# Patient Record
Sex: Female | Born: 1935 | Race: White | Hispanic: No | Smoking: Former smoker
Health system: Southern US, Community
[De-identification: ages and names within clinical notes are randomized; demographics above are authoritative.]

## PROBLEM LIST (undated history)

## (undated) DIAGNOSIS — M1990 Unspecified osteoarthritis, unspecified site: Secondary | ICD-10-CM

## (undated) DIAGNOSIS — M8580 Other specified disorders of bone density and structure, unspecified site: Secondary | ICD-10-CM

## (undated) DIAGNOSIS — J449 Chronic obstructive pulmonary disease, unspecified: Secondary | ICD-10-CM

## (undated) DIAGNOSIS — E079 Disorder of thyroid, unspecified: Secondary | ICD-10-CM

## (undated) DIAGNOSIS — E785 Hyperlipidemia, unspecified: Secondary | ICD-10-CM

## (undated) DIAGNOSIS — R413 Other amnesia: Secondary | ICD-10-CM

## (undated) DIAGNOSIS — R269 Unspecified abnormalities of gait and mobility: Secondary | ICD-10-CM

## (undated) DIAGNOSIS — C801 Malignant (primary) neoplasm, unspecified: Secondary | ICD-10-CM

## (undated) DIAGNOSIS — I10 Essential (primary) hypertension: Secondary | ICD-10-CM

## (undated) DIAGNOSIS — E538 Deficiency of other specified B group vitamins: Secondary | ICD-10-CM

## (undated) DIAGNOSIS — M25569 Pain in unspecified knee: Secondary | ICD-10-CM

## (undated) HISTORY — DX: Essential (primary) hypertension: I10

## (undated) HISTORY — DX: Deficiency of other specified B group vitamins: E53.8

## (undated) HISTORY — DX: Other amnesia: R41.3

## (undated) HISTORY — PX: OOPHORECTOMY: SHX86

## (undated) HISTORY — PX: ABDOMINAL HYSTERECTOMY: SHX81

## (undated) HISTORY — DX: Disorder of thyroid, unspecified: E07.9

## (undated) HISTORY — DX: Other specified disorders of bone density and structure, unspecified site: M85.80

## (undated) HISTORY — DX: Unspecified abnormalities of gait and mobility: R26.9

## (undated) HISTORY — DX: Hyperlipidemia, unspecified: E78.5

## (undated) HISTORY — DX: Unspecified osteoarthritis, unspecified site: M19.90

## (undated) HISTORY — PX: APPENDECTOMY: SHX54

## (undated) HISTORY — DX: Pain in unspecified knee: M25.569

## (undated) HISTORY — PX: CHOLECYSTECTOMY: SHX55

---

## 1998-02-15 ENCOUNTER — Encounter: Payer: Self-pay | Admitting: Family Medicine

## 1998-02-15 ENCOUNTER — Ambulatory Visit (HOSPITAL_COMMUNITY): Admission: RE | Admit: 1998-02-15 | Discharge: 1998-02-15 | Payer: Self-pay | Admitting: Family Medicine

## 1998-05-07 ENCOUNTER — Ambulatory Visit (HOSPITAL_COMMUNITY): Admission: RE | Admit: 1998-05-07 | Discharge: 1998-05-07 | Payer: Self-pay | Admitting: Gastroenterology

## 1998-11-25 ENCOUNTER — Encounter: Payer: Self-pay | Admitting: Orthopedic Surgery

## 1998-11-25 ENCOUNTER — Ambulatory Visit (HOSPITAL_COMMUNITY): Admission: AD | Admit: 1998-11-25 | Discharge: 1998-11-25 | Payer: Self-pay | Admitting: Orthopedic Surgery

## 1998-12-09 ENCOUNTER — Encounter: Payer: Self-pay | Admitting: Orthopedic Surgery

## 1998-12-09 ENCOUNTER — Ambulatory Visit (HOSPITAL_COMMUNITY): Admission: RE | Admit: 1998-12-09 | Discharge: 1998-12-09 | Payer: Self-pay | Admitting: Orthopedic Surgery

## 1998-12-23 ENCOUNTER — Encounter: Payer: Self-pay | Admitting: Orthopedic Surgery

## 1998-12-23 ENCOUNTER — Ambulatory Visit (HOSPITAL_COMMUNITY): Admission: RE | Admit: 1998-12-23 | Discharge: 1998-12-23 | Payer: Self-pay | Admitting: Orthopedic Surgery

## 1999-02-19 ENCOUNTER — Ambulatory Visit (HOSPITAL_COMMUNITY): Admission: RE | Admit: 1999-02-19 | Discharge: 1999-02-19 | Payer: Self-pay | Admitting: Family Medicine

## 1999-02-19 ENCOUNTER — Encounter: Payer: Self-pay | Admitting: Family Medicine

## 1999-03-07 ENCOUNTER — Inpatient Hospital Stay (HOSPITAL_COMMUNITY): Admission: EM | Admit: 1999-03-07 | Discharge: 1999-03-10 | Payer: Self-pay | Admitting: Emergency Medicine

## 1999-07-02 ENCOUNTER — Encounter: Admission: RE | Admit: 1999-07-02 | Discharge: 1999-07-30 | Payer: Self-pay | Admitting: Orthopedic Surgery

## 2000-02-20 ENCOUNTER — Ambulatory Visit (HOSPITAL_COMMUNITY): Admission: RE | Admit: 2000-02-20 | Discharge: 2000-02-20 | Payer: Self-pay | Admitting: Family Medicine

## 2000-02-20 ENCOUNTER — Encounter: Payer: Self-pay | Admitting: Family Medicine

## 2001-03-03 ENCOUNTER — Encounter: Payer: Self-pay | Admitting: Family Medicine

## 2001-03-03 ENCOUNTER — Ambulatory Visit (HOSPITAL_COMMUNITY): Admission: RE | Admit: 2001-03-03 | Discharge: 2001-03-03 | Payer: Self-pay | Admitting: Family Medicine

## 2001-06-08 ENCOUNTER — Encounter: Admission: RE | Admit: 2001-06-08 | Discharge: 2001-06-08 | Payer: Self-pay | Admitting: Orthopedic Surgery

## 2001-06-08 ENCOUNTER — Encounter: Payer: Self-pay | Admitting: Orthopedic Surgery

## 2001-06-22 ENCOUNTER — Encounter: Payer: Self-pay | Admitting: Orthopedic Surgery

## 2001-06-22 ENCOUNTER — Encounter: Admission: RE | Admit: 2001-06-22 | Discharge: 2001-06-22 | Payer: Self-pay | Admitting: Orthopedic Surgery

## 2001-07-01 ENCOUNTER — Ambulatory Visit (HOSPITAL_COMMUNITY): Admission: RE | Admit: 2001-07-01 | Discharge: 2001-07-01 | Payer: Self-pay | Admitting: Gastroenterology

## 2001-07-01 ENCOUNTER — Encounter (INDEPENDENT_AMBULATORY_CARE_PROVIDER_SITE_OTHER): Payer: Self-pay | Admitting: Specialist

## 2001-07-06 ENCOUNTER — Encounter: Payer: Self-pay | Admitting: Orthopedic Surgery

## 2001-07-06 ENCOUNTER — Inpatient Hospital Stay (HOSPITAL_COMMUNITY): Admission: AD | Admit: 2001-07-06 | Discharge: 2001-07-11 | Payer: Self-pay | Admitting: Orthopedic Surgery

## 2001-07-07 ENCOUNTER — Encounter: Payer: Self-pay | Admitting: Orthopedic Surgery

## 2001-07-08 ENCOUNTER — Encounter: Payer: Self-pay | Admitting: Orthopedic Surgery

## 2001-12-12 ENCOUNTER — Encounter: Admission: RE | Admit: 2001-12-12 | Discharge: 2001-12-12 | Payer: Self-pay | Admitting: Orthopaedic Surgery

## 2001-12-26 ENCOUNTER — Encounter: Admission: RE | Admit: 2001-12-26 | Discharge: 2001-12-26 | Payer: Self-pay | Admitting: Orthopaedic Surgery

## 2002-03-15 ENCOUNTER — Encounter: Payer: Self-pay | Admitting: Family Medicine

## 2002-03-15 ENCOUNTER — Ambulatory Visit (HOSPITAL_COMMUNITY): Admission: RE | Admit: 2002-03-15 | Discharge: 2002-03-15 | Payer: Self-pay | Admitting: Family Medicine

## 2002-03-24 ENCOUNTER — Encounter: Admission: RE | Admit: 2002-03-24 | Discharge: 2002-03-24 | Payer: Self-pay | Admitting: Orthopaedic Surgery

## 2002-10-11 LAB — HM COLONOSCOPY

## 2003-03-09 ENCOUNTER — Encounter: Admission: RE | Admit: 2003-03-09 | Discharge: 2003-03-12 | Payer: Self-pay | Admitting: Family Medicine

## 2003-03-19 ENCOUNTER — Ambulatory Visit (HOSPITAL_COMMUNITY): Admission: RE | Admit: 2003-03-19 | Discharge: 2003-03-19 | Payer: Self-pay | Admitting: Family Medicine

## 2003-12-06 ENCOUNTER — Encounter: Admission: RE | Admit: 2003-12-06 | Discharge: 2003-12-06 | Payer: Self-pay | Admitting: Family Medicine

## 2004-03-12 ENCOUNTER — Ambulatory Visit: Payer: Self-pay | Admitting: Family Medicine

## 2004-03-14 ENCOUNTER — Ambulatory Visit: Payer: Self-pay | Admitting: Family Medicine

## 2004-03-20 ENCOUNTER — Ambulatory Visit (HOSPITAL_COMMUNITY): Admission: RE | Admit: 2004-03-20 | Discharge: 2004-03-20 | Payer: Self-pay | Admitting: Family Medicine

## 2004-04-15 ENCOUNTER — Ambulatory Visit: Payer: Self-pay | Admitting: Internal Medicine

## 2004-04-18 ENCOUNTER — Inpatient Hospital Stay (HOSPITAL_COMMUNITY): Admission: EM | Admit: 2004-04-18 | Discharge: 2004-04-23 | Payer: Self-pay | Admitting: *Deleted

## 2004-04-18 ENCOUNTER — Ambulatory Visit: Payer: Self-pay | Admitting: Internal Medicine

## 2004-04-28 ENCOUNTER — Ambulatory Visit: Payer: Self-pay | Admitting: Family Medicine

## 2004-05-02 ENCOUNTER — Ambulatory Visit: Payer: Self-pay | Admitting: Family Medicine

## 2004-05-08 ENCOUNTER — Ambulatory Visit: Payer: Self-pay | Admitting: Family Medicine

## 2004-05-09 ENCOUNTER — Encounter: Admission: RE | Admit: 2004-05-09 | Discharge: 2004-05-09 | Payer: Self-pay | Admitting: Family Medicine

## 2004-05-20 ENCOUNTER — Ambulatory Visit: Payer: Self-pay

## 2004-10-08 ENCOUNTER — Ambulatory Visit: Payer: Self-pay | Admitting: Family Medicine

## 2004-12-19 ENCOUNTER — Ambulatory Visit: Payer: Self-pay | Admitting: Family Medicine

## 2005-03-11 ENCOUNTER — Ambulatory Visit (HOSPITAL_COMMUNITY): Admission: RE | Admit: 2005-03-11 | Discharge: 2005-03-11 | Payer: Self-pay | Admitting: Gastroenterology

## 2005-03-11 ENCOUNTER — Encounter (INDEPENDENT_AMBULATORY_CARE_PROVIDER_SITE_OTHER): Payer: Self-pay | Admitting: Specialist

## 2005-03-23 ENCOUNTER — Ambulatory Visit (HOSPITAL_COMMUNITY): Admission: RE | Admit: 2005-03-23 | Discharge: 2005-03-23 | Payer: Self-pay | Admitting: Family Medicine

## 2005-05-19 ENCOUNTER — Ambulatory Visit: Payer: Self-pay | Admitting: Family Medicine

## 2005-05-25 ENCOUNTER — Ambulatory Visit: Payer: Self-pay | Admitting: Family Medicine

## 2005-06-01 ENCOUNTER — Ambulatory Visit: Payer: Self-pay | Admitting: Family Medicine

## 2005-06-08 ENCOUNTER — Ambulatory Visit: Payer: Self-pay | Admitting: Family Medicine

## 2005-06-15 ENCOUNTER — Ambulatory Visit: Payer: Self-pay | Admitting: Family Medicine

## 2005-07-15 ENCOUNTER — Ambulatory Visit: Payer: Self-pay | Admitting: Family Medicine

## 2005-08-10 ENCOUNTER — Ambulatory Visit: Payer: Self-pay | Admitting: Family Medicine

## 2005-08-11 ENCOUNTER — Encounter: Admission: RE | Admit: 2005-08-11 | Discharge: 2005-08-11 | Payer: Self-pay | Admitting: Family Medicine

## 2005-08-14 ENCOUNTER — Ambulatory Visit: Payer: Self-pay | Admitting: Family Medicine

## 2005-09-18 ENCOUNTER — Ambulatory Visit: Payer: Self-pay | Admitting: Family Medicine

## 2005-09-25 ENCOUNTER — Encounter: Admission: RE | Admit: 2005-09-25 | Discharge: 2005-09-25 | Payer: Self-pay | Admitting: Internal Medicine

## 2005-09-25 ENCOUNTER — Ambulatory Visit: Payer: Self-pay | Admitting: Internal Medicine

## 2005-10-16 ENCOUNTER — Ambulatory Visit: Payer: Self-pay | Admitting: Family Medicine

## 2005-11-16 ENCOUNTER — Ambulatory Visit: Payer: Self-pay | Admitting: Family Medicine

## 2005-12-04 ENCOUNTER — Ambulatory Visit: Payer: Self-pay | Admitting: Family Medicine

## 2005-12-21 ENCOUNTER — Ambulatory Visit: Payer: Self-pay | Admitting: Family Medicine

## 2006-01-18 ENCOUNTER — Ambulatory Visit: Payer: Self-pay | Admitting: Family Medicine

## 2006-02-02 ENCOUNTER — Ambulatory Visit: Payer: Self-pay | Admitting: Family Medicine

## 2006-02-15 ENCOUNTER — Ambulatory Visit: Payer: Self-pay | Admitting: Family Medicine

## 2006-03-15 ENCOUNTER — Ambulatory Visit: Payer: Self-pay | Admitting: Family Medicine

## 2006-03-23 ENCOUNTER — Ambulatory Visit: Payer: Self-pay | Admitting: Family Medicine

## 2006-04-12 ENCOUNTER — Ambulatory Visit (HOSPITAL_COMMUNITY): Admission: RE | Admit: 2006-04-12 | Discharge: 2006-04-12 | Payer: Self-pay | Admitting: Family Medicine

## 2006-04-19 ENCOUNTER — Ambulatory Visit: Payer: Self-pay | Admitting: Family Medicine

## 2006-05-17 ENCOUNTER — Ambulatory Visit: Payer: Self-pay | Admitting: Family Medicine

## 2006-06-01 ENCOUNTER — Ambulatory Visit: Payer: Self-pay | Admitting: Internal Medicine

## 2006-06-01 ENCOUNTER — Observation Stay (HOSPITAL_COMMUNITY): Admission: EM | Admit: 2006-06-01 | Discharge: 2006-06-04 | Payer: Self-pay | Admitting: Emergency Medicine

## 2006-06-11 ENCOUNTER — Ambulatory Visit: Payer: Self-pay | Admitting: Family Medicine

## 2006-06-14 ENCOUNTER — Ambulatory Visit: Payer: Self-pay | Admitting: Family Medicine

## 2006-08-23 ENCOUNTER — Encounter: Payer: Self-pay | Admitting: Family Medicine

## 2006-08-26 ENCOUNTER — Encounter: Payer: Self-pay | Admitting: Family Medicine

## 2006-08-27 ENCOUNTER — Ambulatory Visit: Payer: Self-pay | Admitting: Family Medicine

## 2006-08-27 DIAGNOSIS — E785 Hyperlipidemia, unspecified: Secondary | ICD-10-CM

## 2006-08-27 DIAGNOSIS — I10 Essential (primary) hypertension: Secondary | ICD-10-CM

## 2006-08-27 DIAGNOSIS — M25569 Pain in unspecified knee: Secondary | ICD-10-CM

## 2006-08-27 DIAGNOSIS — E039 Hypothyroidism, unspecified: Secondary | ICD-10-CM

## 2006-08-27 DIAGNOSIS — E538 Deficiency of other specified B group vitamins: Secondary | ICD-10-CM

## 2006-08-31 ENCOUNTER — Encounter: Payer: Self-pay | Admitting: Family Medicine

## 2006-09-16 ENCOUNTER — Telehealth: Payer: Self-pay | Admitting: Family Medicine

## 2006-10-22 ENCOUNTER — Encounter: Payer: Self-pay | Admitting: Family Medicine

## 2006-12-22 ENCOUNTER — Encounter: Payer: Self-pay | Admitting: Family Medicine

## 2006-12-24 ENCOUNTER — Telehealth (INDEPENDENT_AMBULATORY_CARE_PROVIDER_SITE_OTHER): Payer: Self-pay | Admitting: *Deleted

## 2006-12-28 ENCOUNTER — Ambulatory Visit: Payer: Self-pay | Admitting: Family Medicine

## 2007-02-08 ENCOUNTER — Telehealth (INDEPENDENT_AMBULATORY_CARE_PROVIDER_SITE_OTHER): Payer: Self-pay | Admitting: *Deleted

## 2007-02-08 ENCOUNTER — Ambulatory Visit: Payer: Self-pay | Admitting: Internal Medicine

## 2007-02-08 DIAGNOSIS — N390 Urinary tract infection, site not specified: Secondary | ICD-10-CM

## 2007-02-08 LAB — CONVERTED CEMR LAB
Protein, U semiquant: NEGATIVE
Specific Gravity, Urine: 1.015
Urobilinogen, UA: NEGATIVE

## 2007-02-09 ENCOUNTER — Encounter: Payer: Self-pay | Admitting: Internal Medicine

## 2007-02-09 LAB — CONVERTED CEMR LAB

## 2007-02-20 ENCOUNTER — Emergency Department (HOSPITAL_COMMUNITY): Admission: EM | Admit: 2007-02-20 | Discharge: 2007-02-21 | Payer: Self-pay | Admitting: Emergency Medicine

## 2007-02-22 ENCOUNTER — Ambulatory Visit: Payer: Self-pay | Admitting: Internal Medicine

## 2007-02-22 DIAGNOSIS — R269 Unspecified abnormalities of gait and mobility: Secondary | ICD-10-CM

## 2007-03-01 ENCOUNTER — Ambulatory Visit: Payer: Self-pay | Admitting: Internal Medicine

## 2007-03-01 DIAGNOSIS — S0100XA Unspecified open wound of scalp, initial encounter: Secondary | ICD-10-CM | POA: Insufficient documentation

## 2007-03-01 DIAGNOSIS — J069 Acute upper respiratory infection, unspecified: Secondary | ICD-10-CM | POA: Insufficient documentation

## 2007-03-21 ENCOUNTER — Telehealth (INDEPENDENT_AMBULATORY_CARE_PROVIDER_SITE_OTHER): Payer: Self-pay | Admitting: *Deleted

## 2007-04-18 ENCOUNTER — Ambulatory Visit (HOSPITAL_COMMUNITY): Admission: RE | Admit: 2007-04-18 | Discharge: 2007-04-18 | Payer: Self-pay | Admitting: Family Medicine

## 2007-04-25 ENCOUNTER — Encounter (INDEPENDENT_AMBULATORY_CARE_PROVIDER_SITE_OTHER): Payer: Self-pay | Admitting: *Deleted

## 2007-05-19 ENCOUNTER — Telehealth (INDEPENDENT_AMBULATORY_CARE_PROVIDER_SITE_OTHER): Payer: Self-pay | Admitting: *Deleted

## 2007-06-15 ENCOUNTER — Ambulatory Visit: Payer: Self-pay | Admitting: Family Medicine

## 2007-06-15 DIAGNOSIS — R209 Unspecified disturbances of skin sensation: Secondary | ICD-10-CM

## 2007-06-15 LAB — CONVERTED CEMR LAB
Albumin: 4.2 g/dL (ref 3.5–5.2)
Alkaline Phosphatase: 58 units/L (ref 39–117)
BUN: 18 mg/dL (ref 6–23)
Basophils Absolute: 0 10*3/uL (ref 0.0–0.1)
Calcium: 9.3 mg/dL (ref 8.4–10.5)
Cholesterol: 149 mg/dL (ref 0–200)
Creatinine, Ser: 0.8 mg/dL (ref 0.4–1.2)
Eosinophils Absolute: 0.2 10*3/uL (ref 0.0–0.7)
Free T4: 0.8 ng/dL (ref 0.6–1.6)
GFR calc Af Amer: 88 mL/min
GFR calc non Af Amer: 73 mL/min
Hemoglobin: 13 g/dL (ref 12.0–15.0)
LDL Cholesterol: 77 mg/dL (ref 0–99)
Lymphocytes Relative: 30.9 % (ref 12.0–46.0)
MCHC: 33.9 g/dL (ref 30.0–36.0)
MCV: 97.5 fL (ref 78.0–100.0)
Neutro Abs: 2.9 10*3/uL (ref 1.4–7.7)
Potassium: 4.5 meq/L (ref 3.5–5.1)
RDW: 12.2 % (ref 11.5–14.6)
T3, Free: 2.9 pg/mL (ref 2.3–4.2)
TSH: 1.72 microintl units/mL (ref 0.35–5.50)
Total Protein: 7 g/dL (ref 6.0–8.3)
Triglycerides: 101 mg/dL (ref 0–149)
VLDL: 20 mg/dL (ref 0–40)
Vitamin B-12: 339 pg/mL (ref 211–911)

## 2007-06-16 ENCOUNTER — Encounter (INDEPENDENT_AMBULATORY_CARE_PROVIDER_SITE_OTHER): Payer: Self-pay | Admitting: *Deleted

## 2007-07-13 ENCOUNTER — Telehealth (INDEPENDENT_AMBULATORY_CARE_PROVIDER_SITE_OTHER): Payer: Self-pay | Admitting: *Deleted

## 2007-10-10 ENCOUNTER — Telehealth (INDEPENDENT_AMBULATORY_CARE_PROVIDER_SITE_OTHER): Payer: Self-pay | Admitting: *Deleted

## 2007-10-19 ENCOUNTER — Encounter: Payer: Self-pay | Admitting: Family Medicine

## 2007-12-30 ENCOUNTER — Ambulatory Visit: Payer: Self-pay | Admitting: Family Medicine

## 2007-12-30 DIAGNOSIS — R413 Other amnesia: Secondary | ICD-10-CM | POA: Insufficient documentation

## 2008-01-02 ENCOUNTER — Encounter (INDEPENDENT_AMBULATORY_CARE_PROVIDER_SITE_OTHER): Payer: Self-pay | Admitting: *Deleted

## 2008-01-04 ENCOUNTER — Ambulatory Visit: Payer: Self-pay | Admitting: Family Medicine

## 2008-01-07 ENCOUNTER — Encounter: Admission: RE | Admit: 2008-01-07 | Discharge: 2008-01-07 | Payer: Self-pay | Admitting: Family Medicine

## 2008-01-09 ENCOUNTER — Telehealth: Payer: Self-pay | Admitting: Family Medicine

## 2008-01-17 ENCOUNTER — Encounter (INDEPENDENT_AMBULATORY_CARE_PROVIDER_SITE_OTHER): Payer: Self-pay | Admitting: *Deleted

## 2008-01-17 LAB — CONVERTED CEMR LAB
AST: 22 units/L (ref 0–37)
Albumin: 4 g/dL (ref 3.5–5.2)
Alkaline Phosphatase: 53 units/L (ref 39–117)
BUN: 11 mg/dL (ref 6–23)
Bilirubin, Direct: 0.1 mg/dL (ref 0.0–0.3)
CO2: 28 meq/L (ref 19–32)
Chloride: 102 meq/L (ref 96–112)
Cholesterol: 140 mg/dL (ref 0–200)
Glucose, Bld: 68 mg/dL — ABNORMAL LOW (ref 70–99)
Potassium: 3.6 meq/L (ref 3.5–5.1)
Sodium: 139 meq/L (ref 135–145)
Total Protein: 6.5 g/dL (ref 6.0–8.3)

## 2008-03-19 ENCOUNTER — Telehealth (INDEPENDENT_AMBULATORY_CARE_PROVIDER_SITE_OTHER): Payer: Self-pay | Admitting: *Deleted

## 2008-05-03 ENCOUNTER — Ambulatory Visit (HOSPITAL_COMMUNITY): Admission: RE | Admit: 2008-05-03 | Discharge: 2008-05-03 | Payer: Self-pay | Admitting: Family Medicine

## 2008-07-02 ENCOUNTER — Telehealth (INDEPENDENT_AMBULATORY_CARE_PROVIDER_SITE_OTHER): Payer: Self-pay | Admitting: *Deleted

## 2008-07-10 ENCOUNTER — Telehealth (INDEPENDENT_AMBULATORY_CARE_PROVIDER_SITE_OTHER): Payer: Self-pay | Admitting: *Deleted

## 2008-07-25 ENCOUNTER — Telehealth (INDEPENDENT_AMBULATORY_CARE_PROVIDER_SITE_OTHER): Payer: Self-pay | Admitting: *Deleted

## 2008-09-17 ENCOUNTER — Ambulatory Visit: Payer: Self-pay | Admitting: Family Medicine

## 2008-09-20 ENCOUNTER — Ambulatory Visit: Payer: Self-pay | Admitting: Family Medicine

## 2008-09-30 LAB — CONVERTED CEMR LAB
ALT: 14 units/L (ref 0–35)
AST: 22 units/L (ref 0–37)
BUN: 9 mg/dL (ref 6–23)
Basophils Absolute: 0 10*3/uL (ref 0.0–0.1)
Basophils Relative: 0.3 % (ref 0.0–3.0)
Bilirubin, Direct: 0.1 mg/dL (ref 0.0–0.3)
CO2: 32 meq/L (ref 19–32)
Calcium: 8.8 mg/dL (ref 8.4–10.5)
Eosinophils Absolute: 0.2 10*3/uL (ref 0.0–0.7)
GFR calc non Af Amer: 84.84 mL/min (ref >60)
Glucose, Bld: 84 mg/dL (ref 70–99)
HDL: 57.1 mg/dL (ref >39.00)
Hemoglobin: 11.1 g/dL — ABNORMAL LOW (ref 12.0–15.0)
Lymphs Abs: 1.4 10*3/uL (ref 0.7–4.0)
MCHC: 34.1 g/dL (ref 30.0–36.0)
MCV: 99.5 fL (ref 78.0–100.0)
Monocytes Absolute: 0.5 10*3/uL (ref 0.1–1.0)
Neutro Abs: 1.9 10*3/uL (ref 1.4–7.7)
Potassium: 4.4 meq/L (ref 3.5–5.1)
RBC: 3.28 M/uL — ABNORMAL LOW (ref 3.87–5.11)
RDW: 12 % (ref 11.5–14.6)
Sodium: 140 meq/L (ref 135–145)
Total Bilirubin: 0.7 mg/dL (ref 0.3–1.2)
Total CHOL/HDL Ratio: 3

## 2008-10-03 ENCOUNTER — Encounter (INDEPENDENT_AMBULATORY_CARE_PROVIDER_SITE_OTHER): Payer: Self-pay | Admitting: *Deleted

## 2009-01-03 ENCOUNTER — Ambulatory Visit: Payer: Self-pay | Admitting: Family Medicine

## 2009-01-03 DIAGNOSIS — M949 Disorder of cartilage, unspecified: Secondary | ICD-10-CM

## 2009-01-03 DIAGNOSIS — M899 Disorder of bone, unspecified: Secondary | ICD-10-CM | POA: Insufficient documentation

## 2009-02-28 ENCOUNTER — Encounter: Payer: Self-pay | Admitting: Family Medicine

## 2009-03-12 ENCOUNTER — Encounter: Payer: Self-pay | Admitting: Family Medicine

## 2009-03-20 ENCOUNTER — Encounter: Payer: Self-pay | Admitting: Family Medicine

## 2009-05-06 ENCOUNTER — Ambulatory Visit (HOSPITAL_COMMUNITY): Admission: RE | Admit: 2009-05-06 | Discharge: 2009-05-06 | Payer: Self-pay | Admitting: Family Medicine

## 2009-05-06 ENCOUNTER — Encounter: Payer: Self-pay | Admitting: Family Medicine

## 2009-05-20 ENCOUNTER — Telehealth (INDEPENDENT_AMBULATORY_CARE_PROVIDER_SITE_OTHER): Payer: Self-pay | Admitting: *Deleted

## 2009-05-20 ENCOUNTER — Encounter: Payer: Self-pay | Admitting: Family Medicine

## 2009-05-22 ENCOUNTER — Encounter: Payer: Self-pay | Admitting: Family Medicine

## 2009-08-14 ENCOUNTER — Encounter: Payer: Self-pay | Admitting: Family Medicine

## 2009-10-08 ENCOUNTER — Ambulatory Visit: Payer: Self-pay | Admitting: Family Medicine

## 2009-10-08 LAB — CONVERTED CEMR LAB
Bilirubin Urine: NEGATIVE
Blood in Urine, dipstick: NEGATIVE
Glucose, Urine, Semiquant: NEGATIVE
Ketones, urine, test strip: NEGATIVE
Protein, U semiquant: NEGATIVE
Urobilinogen, UA: NEGATIVE
pH: 7.5

## 2009-10-09 ENCOUNTER — Encounter: Payer: Self-pay | Admitting: Family Medicine

## 2009-10-15 ENCOUNTER — Telehealth (INDEPENDENT_AMBULATORY_CARE_PROVIDER_SITE_OTHER): Payer: Self-pay | Admitting: *Deleted

## 2009-10-15 LAB — CONVERTED CEMR LAB
ALT: 17 units/L (ref 0–35)
AST: 23 units/L (ref 0–37)
Albumin: 3.9 g/dL (ref 3.5–5.2)
Basophils Absolute: 0 10*3/uL (ref 0.0–0.1)
Chloride: 93 meq/L — ABNORMAL LOW (ref 96–112)
Eosinophils Relative: 2.5 % (ref 0.0–5.0)
Folate: 14.3 ng/mL
GFR calc non Af Amer: 92.18 mL/min (ref >60)
Glucose, Bld: 82 mg/dL (ref 70–99)
HCT: 34.7 % — ABNORMAL LOW (ref 36.0–46.0)
HDL: 45 mg/dL (ref >39.00)
Hemoglobin: 11.8 g/dL — ABNORMAL LOW (ref 12.0–15.0)
Iron: 98 ug/dL (ref 42–145)
Lymphs Abs: 1.2 10*3/uL (ref 0.7–4.0)
MCV: 102.2 fL — ABNORMAL HIGH (ref 78.0–100.0)
Monocytes Relative: 13.3 % — ABNORMAL HIGH (ref 3.0–12.0)
Neutro Abs: 3.1 10*3/uL (ref 1.4–7.7)
Potassium: 4.2 meq/L (ref 3.5–5.1)
RDW: 13 % (ref 11.5–14.6)
Sodium: 133 meq/L — ABNORMAL LOW (ref 135–145)
TSH: 2.32 microintl units/mL (ref 0.35–5.50)
Transferrin: 236.6 mg/dL (ref 212.0–360.0)
Vitamin B-12: 479 pg/mL (ref 211–911)

## 2009-10-17 ENCOUNTER — Encounter (INDEPENDENT_AMBULATORY_CARE_PROVIDER_SITE_OTHER): Payer: Self-pay | Admitting: *Deleted

## 2009-10-17 ENCOUNTER — Ambulatory Visit: Payer: Self-pay | Admitting: Family Medicine

## 2009-12-17 ENCOUNTER — Ambulatory Visit: Payer: Self-pay | Admitting: Family Medicine

## 2009-12-23 ENCOUNTER — Ambulatory Visit: Payer: Self-pay | Admitting: Family Medicine

## 2009-12-25 LAB — CONVERTED CEMR LAB
ALT: 14 units/L (ref 0–35)
BUN: 13 mg/dL (ref 6–23)
Bilirubin, Direct: 0.1 mg/dL (ref 0.0–0.3)
CO2: 26 meq/L (ref 19–32)
Chloride: 98 meq/L (ref 96–112)
Creatinine, Ser: 0.7 mg/dL (ref 0.4–1.2)
Direct LDL: 112.2 mg/dL
Eosinophils Absolute: 0.1 10*3/uL (ref 0.0–0.7)
Eosinophils Relative: 2.6 % (ref 0.0–5.0)
Glucose, Bld: 83 mg/dL (ref 70–99)
HCT: 34.1 % — ABNORMAL LOW (ref 36.0–46.0)
Lymphs Abs: 1.4 10*3/uL (ref 0.7–4.0)
MCHC: 34 g/dL (ref 30.0–36.0)
MCV: 100 fL (ref 78.0–100.0)
Monocytes Absolute: 0.5 10*3/uL (ref 0.1–1.0)
Neutrophils Relative %: 58.9 % (ref 43.0–77.0)
Platelets: 184 10*3/uL (ref 150.0–400.0)
Potassium: 4.1 meq/L (ref 3.5–5.1)
TSH: 5.55 microintl units/mL — ABNORMAL HIGH (ref 0.35–5.50)
Total Bilirubin: 0.8 mg/dL (ref 0.3–1.2)
Total Protein: 6.3 g/dL (ref 6.0–8.3)
Vit D, 25-Hydroxy: 50 ng/mL (ref 30–89)
WBC: 5 10*3/uL (ref 4.5–10.5)

## 2010-01-03 ENCOUNTER — Telehealth (INDEPENDENT_AMBULATORY_CARE_PROVIDER_SITE_OTHER): Payer: Self-pay | Admitting: *Deleted

## 2010-01-20 ENCOUNTER — Ambulatory Visit: Payer: Self-pay | Admitting: Family Medicine

## 2010-01-21 ENCOUNTER — Telehealth (INDEPENDENT_AMBULATORY_CARE_PROVIDER_SITE_OTHER): Payer: Self-pay | Admitting: *Deleted

## 2010-01-21 LAB — CONVERTED CEMR LAB
Basophils Absolute: 0 10*3/uL (ref 0.0–0.1)
Eosinophils Absolute: 0.1 10*3/uL (ref 0.0–0.7)
Hemoglobin: 11.9 g/dL — ABNORMAL LOW (ref 12.0–15.0)
Lymphocytes Relative: 28.5 % (ref 12.0–46.0)
MCHC: 34 g/dL (ref 30.0–36.0)
Monocytes Relative: 9.7 % (ref 3.0–12.0)
Neutrophils Relative %: 58.6 % (ref 43.0–77.0)
RBC: 3.5 M/uL — ABNORMAL LOW (ref 3.87–5.11)
RDW: 13.4 % (ref 11.5–14.6)
Saturation Ratios: 32.2 % (ref 20.0–50.0)
Transferrin: 277.6 mg/dL (ref 212.0–360.0)
Vitamin B-12: 364 pg/mL (ref 211–911)

## 2010-01-26 ENCOUNTER — Encounter: Payer: Self-pay | Admitting: Family Medicine

## 2010-03-04 ENCOUNTER — Encounter: Payer: Self-pay | Admitting: Family Medicine

## 2010-03-06 ENCOUNTER — Ambulatory Visit
Admission: RE | Admit: 2010-03-06 | Discharge: 2010-03-06 | Payer: Self-pay | Source: Home / Self Care | Attending: Family Medicine | Admitting: Family Medicine

## 2010-03-06 ENCOUNTER — Other Ambulatory Visit: Payer: Self-pay | Admitting: Family Medicine

## 2010-03-06 ENCOUNTER — Encounter: Payer: Self-pay | Admitting: Family Medicine

## 2010-03-06 LAB — LIPID PANEL
Cholesterol: 151 mg/dL (ref 0–200)
HDL: 41.1 mg/dL (ref >39.00)
LDL Cholesterol: 78 mg/dL (ref 0–99)
Total CHOL/HDL Ratio: 4
Triglycerides: 159 mg/dL — ABNORMAL HIGH (ref 0.0–149.0)
VLDL: 31.8 mg/dL (ref 0.0–40.0)

## 2010-03-06 LAB — BASIC METABOLIC PANEL
BUN: 18 mg/dL (ref 6–23)
CO2: 29 mEq/L (ref 19–32)
Calcium: 9.6 mg/dL (ref 8.4–10.5)
Chloride: 100 mEq/L (ref 96–112)
Creatinine, Ser: 1 mg/dL (ref 0.4–1.2)
GFR: 53.54 mL/min — ABNORMAL LOW (ref >60.00)
Glucose, Bld: 90 mg/dL (ref 70–99)
Potassium: 4.8 mEq/L (ref 3.5–5.1)
Sodium: 138 mEq/L (ref 135–145)

## 2010-03-06 LAB — CBC WITH DIFFERENTIAL/PLATELET
Basophils Absolute: 0 10*3/uL (ref 0.0–0.1)
Basophils Relative: 0.4 % (ref 0.0–3.0)
Eosinophils Absolute: 0.2 10*3/uL (ref 0.0–0.7)
Eosinophils Relative: 3.2 % (ref 0.0–5.0)
HCT: 36.2 % (ref 36.0–46.0)
Hemoglobin: 12 g/dL (ref 12.0–15.0)
Lymphocytes Relative: 28.2 % (ref 12.0–46.0)
Lymphs Abs: 1.3 10*3/uL (ref 0.7–4.0)
MCHC: 33.2 g/dL (ref 30.0–36.0)
MCV: 101 fl — ABNORMAL HIGH (ref 78.0–100.0)
Monocytes Absolute: 0.6 10*3/uL (ref 0.1–1.0)
Monocytes Relative: 12.2 % — ABNORMAL HIGH (ref 3.0–12.0)
Neutro Abs: 2.6 10*3/uL (ref 1.4–7.7)
Neutrophils Relative %: 56 % (ref 43.0–77.0)
Platelets: 194 10*3/uL (ref 150.0–400.0)
RBC: 3.58 Mil/uL — ABNORMAL LOW (ref 3.87–5.11)
RDW: 13.2 % (ref 11.5–14.6)
WBC: 4.7 10*3/uL (ref 4.5–10.5)

## 2010-03-06 LAB — FERRITIN: Ferritin: 340.9 ng/mL — ABNORMAL HIGH (ref 10.0–291.0)

## 2010-03-06 LAB — HEPATIC FUNCTION PANEL
ALT: 13 U/L (ref 0–35)
AST: 23 U/L (ref 0–37)
Albumin: 4.1 g/dL (ref 3.5–5.2)
Alkaline Phosphatase: 33 U/L — ABNORMAL LOW (ref 39–117)
Bilirubin, Direct: 0.2 mg/dL (ref 0.0–0.3)
Total Bilirubin: 0.8 mg/dL (ref 0.3–1.2)
Total Protein: 6.6 g/dL (ref 6.0–8.3)

## 2010-03-06 LAB — IBC PANEL
Iron: 86 ug/dL (ref 42–145)
Saturation Ratios: 16.5 % — ABNORMAL LOW (ref 20.0–50.0)
Transferrin: 372.5 mg/dL — ABNORMAL HIGH (ref 212.0–360.0)

## 2010-03-06 LAB — B12 AND FOLATE PANEL
Folate: 11.9 ng/mL
Vitamin B-12: 365 pg/mL (ref 211–911)

## 2010-03-06 LAB — TSH: TSH: 4.25 u[IU]/mL (ref 0.35–5.50)

## 2010-04-01 NOTE — Medication Information (Signed)
Summary: Letter Regarding Sertraline/[facility]  Letter Regarding Sertraline/[facility]   Imported By: Lanelle Bal 07/09/2009 12:38:24  _____________________________________________________________________  External Attachment:    Type:   Image     Comment:   External Document

## 2010-04-01 NOTE — Letter (Signed)
Summary: Fax Regarding Certification for SCAT/[facility]  Fax Regarding Certification for SCAT/[facility]   Imported By: Lanelle Bal 03/07/2009 10:48:20  _____________________________________________________________________  External Attachment:    Type:   Image     Comment:   External Document  Appended Document: Fax Regarding Certification for SCAT/[facility] done  Appended Document: Fax Regarding Certification for SCAT/[facility] faxed.

## 2010-04-01 NOTE — Medication Information (Signed)
Summary: Formulary Letter Regarding Vytorin/[employer]  Formulary Letter Regarding Vytorin/[employer]   Imported By: Lanelle Bal 05/31/2009 11:57:08  _____________________________________________________________________  External Attachment:    Type:   Image     Comment:   External Document

## 2010-04-01 NOTE — Assessment & Plan Note (Signed)
Summary: DISCUSS MED/FLU SHOT/KN   Vital Signs:  Patient profile:   77 year old female Menstrual status:  hysterectomy Weight:      229.2 pounds Temp:     98.0 degrees F oral Pulse rate:   80 / minute Pulse rhythm:   regular BP sitting:   118 / 60  (right arm) Cuff size:   regular  Vitals Entered By: Almeta Monas CMA Duncan Dull) (December 17, 2009 11:20 AM) CC: pt here for meds check and request flu vaccine  Does patient need assistance? Functional Status Self care, Cook/clean, Shopping, Social activities Ambulation Impaired:Risk for fall Comments pt is able to do all adls---she can read and write  pt walks with walker Her son is staying with her to help since husband died  Vision Screening:      Vision Comments: august 29th--she saw optho 40db HL: Left  Right  Audiometry Comment: slightly HOH    History of Present Illness: Pt here for med check and is requesting a flu shot.  Pt saw Dr Patty Sermons about 2 years ago but states it is too hard to get to his office.  Pt sees Dr Lottie Dawson for her eyes.   Pt also sees Dr Lajoyce Corners for her arthritis.   Pt states she gets a little SOB but does not have any Chest Pain.     Preventive Screening-Counseling & Management  Alcohol-Tobacco     Alcohol drinks/day: 2     Alcohol type: all     Smoking Status: quit > 6 months     Packs/Day: 1.0     Year Started: 1942     Year Quit: 1978  Caffeine-Diet-Exercise     Caffeine use/day: 0     Diet Comments: poor     Diet Counseling: to improve diet; diet is suboptimal     Does Patient Exercise: no  Hep-HIV-STD-Contraception     Dental Visit-last 6 months dentures     SBE monthly: yes     SBE Education/Counseling: not indicated; SBE done regularly  Safety-Violence-Falls     Seat Belt Use: yes     Smoke Detectors: no     Violence in the Home: no risk noted     Sexual Abuse: no     Fall Risk: yes     Fall Risk Counseling: counseling provided; falls with injury noted  Current Medications  (verified): 1)  Zoloft 50 Mg  Tabs (Sertraline Hcl) .Marland Kitchen.. 1 By Mouth Once Daily 2)  Pantoprazole Sodium 40 Mg  Tbec (Pantoprazole Sodium) .... Take One Tablet Daily 3)  Diovan Hct 160-25 Mg  Tabs (Valsartan-Hydrochlorothiazide) .... Take One Tablet By Mouth Daily*due For Office Visit** 4)  Synthroid 112 Mcg  Tabs (Levothyroxine Sodium) .... Take One Tablet Daily 5)  Atenolol 25 Mg  Tabs (Atenolol) .Marland Kitchen.. 1 By Mouth Once Daily 6)  Plavix 75 Mg  Tabs (Clopidogrel Bisulfate) .... Take One Tablet Daily*due For Office Visit** 7)  Neurontin 600 Mg  Tabs (Gabapentin) .... Take One Tablet Qhs 8)  Betoptic-S 0.25 %  Susp (Betaxolol Hcl) .... One Drop Each Eye Twice Daily 9)  Detrol La 4 Mg  Cp24 (Tolterodine Tartrate) .... Take One Tablet Daily As Needed**due For Office Visit** 10)  Lipitor 10 Mg Tabs (Atorvastatin Calcium) .... Take 1 Tab Once Daily 11)  K-Lor 20 Meq  Pack (Potassium Chloride) .... Take One Tablet Daily 12)  Vicodin Es   Tabs (Hydrocodone-Acetaminophen Tabs) .Marland Kitchen.. 1 By Mouth Every 6 Hours As Needed 13)  Fosamax  70 Mg Tabs (Alendronate Sodium) .Marland Kitchen.. 1 By Mouth Weekly.  Allergies (verified): 1)  ! Cardizem 2)  ! Neomycin  Past History:  Past Surgical History: Last updated: 02/08/2007 Appendectomy Cholecystectomy Hysterectomy Oophorectomy  Family History: Last updated: 10/08/2009 Family History of Colon CA 1st degree relative <60 Family History of Melanoma  Social History: Last updated: 12/17/2009  Alcohol use-yes Drug use-no Regular exercise-no Retired--nurse Widow/Widower--- husband died 01/04/10  Risk Factors: Alcohol Use: 2 (12/17/2009) Caffeine Use: 0 (12/17/2009) Diet: poor (12/17/2009) Exercise: no (12/17/2009)  Risk Factors: Smoking Status: quit > 6 months (12/17/2009) Packs/Day: 1.0 (12/17/2009)  Past Medical History: Hyperlipidemia Hypertension Hypothyroidism Current Problems:  FAMILY HISTORY OF MELANOMA (ICD-V16.8) FAMILY HISTORY OF COLON CA 1ST  DEGREE RELATIVE <60 (ICD-V16.0) PREVENTIVE HEALTH CARE (ICD-V70.0) OSTEOPENIA (ICD-733.90) FALL FROM OTHER SLIPPING TRIPPING OR STUMBLING (ICD-E885.9) MEMORY LOSS (ICD-780.93) NUMBNESS, HAND (ICD-782.0) LACERATION, SCALP (ICD-873.0) URI (ICD-465.9) GAIT DISTURBANCE (ICD-781.2) UTI (ICD-599.0) KNEE PAIN, RIGHT (ICD-719.46) HYPOTHYROIDISM (ICD-244.9) HYPERTENSION (ICD-401.9) HYPERLIPIDEMIA (ICD-272.4) B12 DEFICIENCY (ICD-266.2)  Family History: Reviewed history from 10/08/2009 and no changes required. Family History of Colon CA 1st degree relative <60 Family History of Melanoma  Social History: Reviewed history from 10/08/2009 and no changes required.  Alcohol use-yes Drug use-no Regular exercise-no Retired--nurse Widow/Widower--- husband died 01-04-2010  Review of Systems      See HPI General:  Denies chills, fatigue, fever, loss of appetite, malaise, sleep disorder, sweats, weakness, and weight loss. Eyes:  Denies blurring, discharge, double vision, eye irritation, eye pain, halos, itching, light sensitivity, red eye, vision loss-1 eye, and vision loss-both eyes. ENT:  Complains of decreased hearing; denies difficulty swallowing, ear discharge, earache, hoarseness, nasal congestion, nosebleeds, postnasal drainage, ringing in ears, sinus pressure, and sore throat. CV:  Complains of shortness of breath with exertion and swelling of feet; denies bluish discoloration of lips or nails, chest pain or discomfort, difficulty breathing at night, difficulty breathing while lying down, fainting, fatigue, leg cramps with exertion, lightheadness, near fainting, palpitations, swelling of hands, and weight gain. Resp:  Complains of shortness of breath; denies chest discomfort, chest pain with inspiration, cough, coughing up blood, excessive snoring, hypersomnolence, morning headaches, pleuritic, sputum productive, and wheezing. GI:  Denies abdominal pain, bloody stools, change in bowel habits,  constipation, dark tarry stools, diarrhea, excessive appetite, gas, hemorrhoids, indigestion, loss of appetite, nausea, vomiting, vomiting blood, and yellowish skin color. GU:  Denies abnormal vaginal bleeding, decreased libido, discharge, dysuria, genital sores, hematuria, incontinence, nocturia, urinary frequency, and urinary hesitancy. MS:  Complains of joint pain; denies joint redness, joint swelling, loss of strength, low back pain, mid back pain, muscle aches, muscle , cramps, muscle weakness, stiffness, and thoracic pain. Derm:  Denies changes in color of skin, changes in nail beds, dryness, excessive perspiration, flushing, hair loss, insect bite(s), itching, lesion(s), poor wound healing, and rash. Neuro:  Denies brief paralysis, difficulty with concentration, disturbances in coordination, falling down, headaches, inability to speak, memory loss, numbness, poor balance, seizures, sensation of room spinning, tingling, tremors, visual disturbances, and weakness. Psych:  Denies alternate hallucination ( auditory/visual), anxiety, depression, easily angered, easily tearful, irritability, mental problems, panic attacks, sense of great danger, suicidal thoughts/plans, thoughts of violence, unusual visions or sounds, and thoughts /plans of harming others. Endo:  Denies cold intolerance, excessive hunger, excessive thirst, excessive urination, heat intolerance, polyuria, and weight change. Heme:  Denies abnormal bruising, bleeding, enlarge lymph nodes, fevers, pallor, and skin discoloration. Allergy:  Denies hives or rash, itching eyes, persistent infections, seasonal allergies, and sneezing.  Physical Exam  General:  Well-developed,well-nourished,in no acute distress; alert,appropriate and cooperative throughout examination Neck:  No deformities, masses, or tenderness noted. Lungs:  Normal respiratory effort, chest expands symmetrically. Lungs are clear to auscultation, no crackles or  wheezes. Heart:  normal rate.   + murmur Extremities:  1+ left pedal edema and 1+ right pedal edema.   Skin:  Intact without suspicious lesions or rashes Cervical Nodes:  No lymphadenopathy noted Psych:  Cognition and judgment appear intact. Alert and cooperative with normal attention span and concentration. No apparent delusions, illusions, hallucinations   Impression & Recommendations:  Problem # 1:  OSTEOPENIA (ICD-733.90)  Her updated medication list for this problem includes:    Fosamax 70 Mg Tabs (Alendronate sodium) .Marland Kitchen... 1 by mouth weekly.  Orders: Venipuncture (04540) TLB-B12 + Folate Pnl (82746_82607-B12/FOL) TLB-Lipid Panel (80061-LIPID) TLB-BMP (Basic Metabolic Panel-BMET) (80048-METABOL) TLB-CBC Platelet - w/Differential (85025-CBCD) TLB-Hepatic/Liver Function Pnl (80076-HEPATIC) TLB-TSH (Thyroid Stimulating Hormone) (84443-TSH) T-Vitamin D (25-Hydroxy) (98119-14782)  Problem # 2:  HYPOTHYROIDISM (ICD-244.9)  Her updated medication list for this problem includes:    Synthroid 112 Mcg Tabs (Levothyroxine sodium) .Marland Kitchen... Take one tablet daily  Orders: Venipuncture (95621) TLB-B12 + Folate Pnl (30865_78469-G29/BMW) TLB-Lipid Panel (80061-LIPID) TLB-BMP (Basic Metabolic Panel-BMET) (80048-METABOL) TLB-CBC Platelet - w/Differential (85025-CBCD) TLB-Hepatic/Liver Function Pnl (80076-HEPATIC) TLB-TSH (Thyroid Stimulating Hormone) (84443-TSH) T-Vitamin D (25-Hydroxy) (41324-40102)  Problem # 3:  HYPERTENSION (ICD-401.9)  Her updated medication list for this problem includes:    Diovan Hct 160-25 Mg Tabs (Valsartan-hydrochlorothiazide) .Marland Kitchen... Take one tablet by mouth daily    Atenolol 25 Mg Tabs (Atenolol) .Marland Kitchen... 1 by mouth once daily  Orders: Venipuncture (72536) TLB-B12 + Folate Pnl (64403_47425-Z56/LOV) TLB-Lipid Panel (80061-LIPID) TLB-BMP (Basic Metabolic Panel-BMET) (80048-METABOL) TLB-CBC Platelet - w/Differential (85025-CBCD) TLB-Hepatic/Liver Function Pnl  (80076-HEPATIC) TLB-TSH (Thyroid Stimulating Hormone) (84443-TSH) T-Vitamin D (25-Hydroxy) (56433-29518)  BP today: 118/60 Prior BP: 110/42 (10/08/2009)  Labs Reviewed: K+: 4.2 (10/08/2009) Creat: : 0.7 (10/08/2009)   Chol: 248 (10/08/2009)   HDL: 45.00 (10/08/2009)   LDL: 70 (09/20/2008)   TG: 224.0 (10/08/2009)  Problem # 4:  HYPERLIPIDEMIA (ICD-272.4)  Her updated medication list for this problem includes:    Lipitor 10 Mg Tabs (Atorvastatin calcium) .Marland Kitchen... Take 1 tab once daily  Orders: Venipuncture (84166) TLB-B12 + Folate Pnl (06301_60109-N23/FTD) TLB-Lipid Panel (80061-LIPID) TLB-BMP (Basic Metabolic Panel-BMET) (80048-METABOL) TLB-CBC Platelet - w/Differential (85025-CBCD) TLB-Hepatic/Liver Function Pnl (80076-HEPATIC) TLB-TSH (Thyroid Stimulating Hormone) (84443-TSH) T-Vitamin D (25-Hydroxy) (32202-54270)  Labs Reviewed: SGOT: 23 (10/08/2009)   SGPT: 17 (10/08/2009)   HDL:45.00 (10/08/2009), 57.10 (09/20/2008)  LDL:70 (09/20/2008), 64 (01/04/2008)  Chol:248 (10/08/2009), 148 (09/20/2008)  Trig:224.0 (10/08/2009), 106.0 (09/20/2008)  Problem # 5:  B12 DEFICIENCY (ICD-266.2)  Orders: Venipuncture (62376) TLB-B12 + Folate Pnl (28315_17616-W73/XTG) TLB-Lipid Panel (80061-LIPID) TLB-BMP (Basic Metabolic Panel-BMET) (80048-METABOL) TLB-CBC Platelet - w/Differential (85025-CBCD) TLB-Hepatic/Liver Function Pnl (80076-HEPATIC) TLB-TSH (Thyroid Stimulating Hormone) (84443-TSH) T-Vitamin D (25-Hydroxy) (62694-85462)  Complete Medication List: 1)  Zoloft 50 Mg Tabs (Sertraline hcl) .Marland Kitchen.. 1 by mouth once daily 2)  Pantoprazole Sodium 40 Mg Tbec (Pantoprazole sodium) .... Take one tablet daily 3)  Diovan Hct 160-25 Mg Tabs (Valsartan-hydrochlorothiazide) .... Take one tablet by mouth daily 4)  Synthroid 112 Mcg Tabs (Levothyroxine sodium) .... Take one tablet daily 5)  Atenolol 25 Mg Tabs (Atenolol) .Marland Kitchen.. 1 by mouth once daily 6)  Plavix 75 Mg Tabs (Clopidogrel bisulfate) ....  Take one tablet daily 7)  Neurontin 600 Mg Tabs (Gabapentin) .... Take one tablet qhs 8)  Betoptic-s 0.25 % Susp (Betaxolol hcl) .Marland KitchenMarland KitchenMarland Kitchen  One drop each eye twice daily 9)  Detrol La 4 Mg Cp24 (Tolterodine tartrate) .... Take one tablet daily as needed 10)  Lipitor 10 Mg Tabs (Atorvastatin calcium) .... Take 1 tab once daily 11)  K-lor 20 Meq Pack (Potassium chloride) .... Take one tablet daily 12)  Vicodin Es Tabs (Hydrocodone-acetaminophen tabs) .Marland Kitchen.. 1 by mouth every 6 hours as needed 13)  Fosamax 70 Mg Tabs (Alendronate sodium) .Marland Kitchen.. 1 by mouth weekly. 14)  Ultram 50 Mg Tabs (Tramadol hcl) .Marland Kitchen.. 1 by mouth every 6 hours as needed for pain  Other Orders: Flu Vaccine 74yrs + MEDICARE PATIENTS (A5409) Administration Flu vaccine - MCR (W1191)  Patient Instructions: 1)  Please schedule a follow-up appointment in 6 months---  cpe 2)  come in for fasting blood work next week Prescriptions: LIPITOR 10 MG TABS (ATORVASTATIN CALCIUM) TAKE 1 TAB once daily  #90 x 3   Entered and Authorized by:   Loreen Freud DO   Signed by:   Loreen Freud DO on 12/17/2009   Method used:   Faxed to ...       [facility] (mail-order)             , [place]         Ph: [phone]       Fax: [phone]   RxID:   [id number] NEURONTIN 600 MG  TABS (GABAPENTIN) TAKE ONE TABLET QHS  #90 Tablet x 3   Entered and Authorized by:   Loreen Freud DO   Signed by:   Loreen Freud DO on 12/17/2009   Method used:   Faxed to ...       [facility] (mail-order)             , [place]         Ph: [phone]       Fax: [phone]   RxID:   [id number] ATENOLOL 25 MG  TABS (ATENOLOL) 1 by mouth once daily  #90 Tablet x 3   Entered and Authorized by:   Loreen Freud DO   Signed by:   Loreen Freud DO on 12/17/2009   Method used:   Faxed to ...       [facility] (mail-order)             , [place]         Ph: [phone]       Fax: [phone]   RxID:   [id number] SYNTHROID 112 MCG  TABS (LEVOTHYROXINE SODIUM) TAKE ONE TABLET DAILY  #90 x  3   Entered and Authorized by:   Loreen Freud DO   Signed by:   Loreen Freud DO on 12/17/2009   Method used:   Faxed to ...       [facility] (mail-order)             , [place]         Ph: [phone]       Fax: [phone]   RxID:   [id number] PANTOPRAZOLE SODIUM 40 MG  TBEC (PANTOPRAZOLE SODIUM) take one tablet daily  #90 Tablet x 3   Entered and Authorized by:   Loreen Freud DO   Signed by:   Loreen Freud DO on 12/17/2009   Method used:   Faxed to ...       [facility] (mail-[facility])             , [place]         Ph: [phone]  Fax: [phone]   RxID:   [id number] DETROL LA 4 MG  CP24 (TOLTERODINE TARTRATE) TAKE ONE TABLET DAILY as needed  #90 x 3   Entered and Authorized by:   Loreen Freud DO   Signed by:   Loreen Freud DO on 12/17/2009   Method used:   Faxed to ...       [facility] (mail-[facility])             , [place]         Ph: [phone]       Fax: [phone]   RxID:   [id number] PLAVIX 75 MG  TABS (CLOPIDOGREL BISULFATE) take one tablet daily  #90 x 3   Entered and Authorized by:   Loreen Freud DO   Signed by:   Loreen Freud DO on 12/17/2009   Method used:   Faxed to ...       [facility] (mail-[facility])             , [place]         Ph: [phone]       Fax: [phone]   RxID:   [id number] DIOVAN HCT 160-25 MG  TABS (VALSARTAN-HYDROCHLOROTHIAZIDE) TAKE ONE TABLET BY MOUTH daily  #90 x 3   Entered and Authorized by:   Loreen Freud DO   Signed by:   Loreen Freud DO on 12/17/2009   Method used:   Faxed to ...       [facility] (mail-[facility])             , [place]         Ph: [phone]       Fax: [phone]   RxID:   [id number] ZOLOFT 50 MG  TABS (SERTRALINE HCL) 1 by mouth once daily  #90 x 3   Entered and Authorized by:   Loreen Freud DO   Signed by:   Loreen Freud DO on 12/17/2009   Method used:   Faxed to ...       [facility] (mail-[facility])             , [place]         Ph: [phone]       Fax: [phone]   RxID:   [id number] ULTRAM 50 MG TABS (TRAMADOL HCL) 1  by mouth every 6 hours as needed for pain  #60 x 1   Entered and Authorized by:   Loreen Freud DO   Signed by:   Loreen Freud DO on 12/17/2009   Method used:   Electronically to        [facility] [phone]* (retail)       [address]       [place], [place]  [address]       Ph: [phone]       Fax: [phone]   RxID:   [id number]    Orders Added: 1)  Flu Vaccine 9yrs + MEDICARE PATIENTS [Q2039] 2)  Administration Flu vaccine - MCR [G0008] 3)  Venipuncture [19379] 4)  TLB-B12 + Folate Pnl [82746_82607-B12/FOL] 5)  TLB-Lipid Panel [80061-LIPID] 6)  TLB-BMP (Basic Metabolic Panel-BMET) [80048-METABOL] 7)  TLB-CBC Platelet - w/Differential [85025-CBCD] 8)  TLB-Hepatic/Liver Function Pnl [80076-HEPATIC] 9)  TLB-TSH (Thyroid Stimulating Hormone) [84443-TSH] 10)  T-Vitamin D (25-Hydroxy) [02409-73532] 11)  Est. Patient Level IV [99242] Flu Vaccine Consent Questions     Do you have a history of severe allergic reactions to this vaccine? no    Any prior history of allergic reactions  to egg and/or gelatin? no    Do you have a sensitivity to the preservative Thimersol? no    Do you have a past history of Guillan-Barre Syndrome? no    Do you currently have an acute febrile illness? no    Have you ever had a severe reaction to latex? no    Vaccine information given and explained to patient? yes    Are you currently pregnant? no    Lot Number:[id number]   Exp Date:08/30/2010   Site Given  Right Deltoid IMd: 1)  Flu Vaccine 19yrs + MEDICARE PATIENTS [Q2039] 2)  Administration Flu vaccine - MCR [G0008]   .lbmedflu  Last Flu Vaccine:  Fluvax 3+ (01/03/2009 2:49:22 PM) Flu Vaccine Result Date:  12/17/2009 Flu Vaccine Result:  given Flu Vaccine Next Due:  1 yr Herpes Zoster Next Due:  Refused

## 2010-04-01 NOTE — Letter (Signed)
Summary: [facility]: Immunoassay Fecal Occult Blood (iFOB) Order Form  [facility] [place]  [address] [place], [place] [address]   Phone: [phone]  Fax: [phone]      [facility]: Immunoassay Fecal Occult Blood (iFOB) Order Form   October 17, 2009 MRN: [id number]   Kirsten Flores 14-Dec-1923   Physicican Name:______dr lowne___________________  Diagnosis Code:_______v76.51___________________      Jeremy Johann CMA

## 2010-04-01 NOTE — Progress Notes (Signed)
Summary:  BP low  Phone Note Call from Patient Call back at Home Phone [phone]   Caller: lisa ([facility]) Summary of Call: Pt is seen once a month for a senior program provided by pt insurance. Pt BP last month was 110/48 and today BP is a little low today at right arm 98/40 and left arm90/42. O2:93 pulse 73.  Pt has taking med today and has been advise on protocol if BP remains low per nurse. If dr Laury Axon has any addition suggestion for pt pls contact pt directly.............Marland KitchenFelecia Deloach CMA  January 21, 2010 2:35 PM    Follow-up for Phone Call        Pt should have ov to check--- can take 1/2 atenolol if con't Follow-up by: Loreen Freud DO,  January 21, 2010 3:58 PM  Additional Follow-up for Phone Call Additional follow up Details #1::        Pt aware will call after holiday to schedule OV but will continue to monitor BP................Marland KitchenFelecia Deloach CMA  January 21, 2010 4:32 PM

## 2010-04-01 NOTE — Progress Notes (Signed)
Summary: ORDERS FOR LABS  Phone Note Call from Patient   Caller: Patient Reason for Call: Lab or Test Results Summary of Call: PT CALLED AND MADE AN APPT FOR THE 21ST OF THIS MONTH FOR LABS.  NEED ORDERS TO PUT IN ON THAT DAY. Initial call taken by: Freddy Jaksch,  January 03, 2010 12:48 PM  Follow-up for Phone Call        CBCD,B12,IBC,FERRITIN-285.9.........Marland KitchenDoristine Devoid CMA  January 03, 2010 4:34 PM

## 2010-04-01 NOTE — Progress Notes (Signed)
Summary:  med concern  Phone Note Outgoing Call   Call placed by: Jeremy Johann CMA,  October 15, 2009 9:48 AM Details for Reason: Ideally your LDL (bad cholesterol) should be <_100___, your HDL (good cholesterol) should be >__40_ and your triglycerides should be< 150.  Diet and exercise will increase HDL and decrease the LDL and triglycerides. Read Dr. Danice Goltz book--Eat Drink and Be Healthy.  Increase vytorin 10/40  #30  1 by mouth  at bedtime , 2 refills.    Hgb--is also slightly low---pt is anemic---  take multivitamin with iron daily  recheck labs 3 months-----285.9  272.4  lipid, hep, bmp, cbcd, ibc Summary of Call: advise pt to increase vytorin to 10/40   pt states that she is not currently taking this med and has not done so in about 2 month or maybe more.pls advise on med..................Marland KitchenFelecia Deloach CMA  October 15, 2009 9:48 AM   pt aware of lab results...............Marland KitchenFelecia Deloach CMA  October 15, 2009 9:48 AM   Follow-up for Phone Call        Why did she stop it--- if its expense---change to lipitor 10 mg #30  --- give 30 day free coupon and samples---it will go generic in November recheck labs 3 months Follow-up by: Loreen Freud DO,  October 15, 2009 1:20 PM  Additional Follow-up for Phone Call Additional follow up Details #1::        tried to call pt no answer or vm will try again later.Marti Sleigh Deloach CMA  October 15, 2009 2:22 PM DISCUSS WITH PATIENT , SAMPLES AND COUPON PLACED UP FRONT FOR PICKUP.Felecia Deloach CMA  October 15, 2009 5:15 PM     New/Updated Medications: LIPITOR 10 MG TABS (ATORVASTATIN CALCIUM) TAKE 1 TAB once daily Prescriptions: LIPITOR 10 MG TABS (ATORVASTATIN CALCIUM) TAKE 1 TAB once daily  #90 x 0   Entered by:   Jeremy Johann CMA   Authorized by:   Loreen Freud DO   Signed by:   Jeremy Johann CMA on 10/15/2009   Method used:   Faxed to ...       [facility]* (retail)             ,          Ph: [phone]       Fax:  [phone]   RxID:   [id number]

## 2010-04-01 NOTE — Progress Notes (Signed)
Summary: Bone density   Phone Note Outgoing Call   Call placed by: Army Fossa CMA,  May 20, 2009 10:44 AM Reason for Call: Discuss lab or test results Summary of Call: Regarding bone density, spoke with pts husband he asked that we give her a call back later on.   pt needs to be taking calcium 1200-1500 mg daily with vita D3 1000u daily----if she is doing that---- add fosamax 70 mg weekly recheck 2 years Signed by Loreen Freud DO on 05/17/2009 at 4:53 PM   Follow-up for Phone Call        Pt is taking vitamin d and calicum. She agreed to taking the fosamax. Army Fossa CMA  May 20, 2009 1:50 PM     New/Updated Medications: FOSAMAX 70 MG TABS (ALENDRONATE SODIUM) 1 by mouth weekly. Prescriptions: FOSAMAX 70 MG TABS (ALENDRONATE SODIUM) 1 by mouth weekly.  #12 x 3   Entered by:   Army Fossa CMA   Authorized by:   Loreen Freud DO   Signed by:   Army Fossa CMA on 05/20/2009   Method used:   Electronically to        [facility]* (mail-order)             ,          Ph: [phone]       Fax: [phone]   RxID:   [id number]

## 2010-04-01 NOTE — Letter (Signed)
Summary: Generic Letter  [facility]  [address] [place], [place] [address]   Phone: [phone]  Fax: [phone]    03/12/2009  re: Kirsten Flores [address] [place], [place]  [address]  To Whom It May Concern:  The above patients date of birth is 08/31/1923.  She is walking with a walker and has history of frequent falls, memory loss and Gait Disturbance.  She [place] unable to to use [place] bus [place].  Her Disability is permanant.  Feel free to call with any further questions.             Sincerely,   Loreen Freud DO

## 2010-04-01 NOTE — Medication Information (Signed)
Summary: Letter Regarding Sertraline/[facility]  Letter Regarding Sertraline/[facility]   Imported By: Lanelle Bal 11/05/2009 12:52:59  _____________________________________________________________________  External Attachment:    Type:   Image     Comment:   External Document

## 2010-04-01 NOTE — Assessment & Plan Note (Signed)
Summary: yearly check/cbs   Vital Signs:  Patient profile:   77 year old female Menstrual status:  hysterectomy Height:      62.5 inches Weight:      229 pounds BMI:     41.37 Temp:     98.5 degrees F oral Pulse rate:   80 / minute Resp:     18 per minute BP sitting:   110 / 42  (left arm)  Vitals Entered By: Jeremy Johann CMA (October 08, 2009 1:12 PM) CC: cpx, not fasting  Does patient need assistance? Functional Status Self care, Cook/clean, Shopping, Social activities Ambulation Impaired:Risk for fall Comments pt had cup of orange juice Pt has someone come in 1x a week to clean She walks with walker She is able to read and write  Vision Screening:      Vision Comments: sees ophtho q1y---+ glasses 40db HL: Left  Right  Audiometry Comment: hearing grossly normal      Menstrual Status hysterectomy   History of Present Illness: Pt here for cpe ---Pt had 1/2 glass OJ about 10 am.   Pt only complaint is that tylenol is not working for her arthritis pain and she can not take vicodin if she needs to go out.    Hypertension follow-up      This is an 77 year old woman who presents for Hypertension follow-up.  The patient complains of fatigue, but denies lightheadedness, urinary frequency, headaches, edema, impotence, and rash.  The patient denies the following associated symptoms: chest pain, chest pressure, exercise intolerance, dyspnea, palpitations, syncope, leg edema, and pedal edema.  Compliance with medications (by patient report) has been near 100%.  The patient reports that dietary compliance has been fair.  The patient reports no exercise.  Adjunctive measures currently used by the patient include salt restriction.    Hyperlipidemia follow-up      The patient also presents for Hyperlipidemia follow-up.  The patient complains of fatigue, but denies muscle aches, GI upset, abdominal pain, flushing, itching, constipation, and diarrhea.  Other symptoms include dypsnea.   The patient denies the following symptoms: chest pain/pressure, exercise intolerance, palpitations, syncope, and pedal edema.  Compliance with medications (by patient report) has been near 100%.  Dietary compliance has been fair.  The patient reports no exercise.  Adjunctive measures currently used by the patient include ASA.    Preventive Screening-Counseling & Management  Alcohol-Tobacco     Alcohol drinks/day: 2     Alcohol type: all     Smoking Status: quit > 6 months     Packs/Day: 1.0     Year Started: 1942     Year Quit: 1978  Caffeine-Diet-Exercise     Caffeine use/day: 0     Diet Comments: poor     Diet Counseling: to improve diet; diet is suboptimal     Does Patient Exercise: no  Hep-HIV-STD-Contraception     Dental Visit-last 6 months dentures     SBE monthly: yes     SBE Education/Counseling: not indicated; SBE done regularly  Safety-Violence-Falls     Seat Belt Use: yes     Smoke Detectors: no     Violence in the Home: no risk noted     Sexual Abuse: no     Fall Risk: yes     Fall Risk Counseling: counseling provided; falls with injury noted      Sexual History:  currently monogamous.    Current Medications (verified): 1)  Zoloft 50 Mg  Tabs (Sertraline Hcl) .Marland Kitchen.. 1 By Mouth Once Daily 2)  Potassium Chloride Crys Cr 20 Meq  Tbcr (Potassium Chloride Crys Cr) .Marland Kitchen.. 1 Daily 3)  Pantoprazole Sodium 40 Mg  Tbec (Pantoprazole Sodium) .... Take One Tablet Daily 4)  Diovan Hct 160-25 Mg  Tabs (Valsartan-Hydrochlorothiazide) .... Take One Tablet By Mouth Daily*due For Office Visit** 5)  Synthroid 112 Mcg  Tabs (Levothyroxine Sodium) .... Take One Tablet Daily-Needs Labs Done Now 6)  Atenolol 25 Mg  Tabs (Atenolol) .Marland Kitchen.. 1 By Mouth Once Daily 7)  Plavix 75 Mg  Tabs (Clopidogrel Bisulfate) .... Take One Tablet Daily*due For Office Visit** 8)  Neurontin 600 Mg  Tabs (Gabapentin) .... Take One Tablet Qhs 9)  Betoptic-S 0.25 %  Susp (Betaxolol Hcl) .... One Drop Each Eye Twice  Daily 10)  Mobic 15 Mg Tabs (Meloxicam) .... 1/2 - 1 By Mouth Once Daily 11)  Detrol La 4 Mg  Cp24 (Tolterodine Tartrate) .... Take One Tablet Daily As Needed**due For Office Visit** 12)  Vytorin 10-20 Mg  Tabs (Ezetimibe-Simvastatin) .... Take One Tablet At Bedtime-Needs Labs 13)  K-Lor 20 Meq  Pack (Potassium Chloride) .... Take One Tablet Daily 14)  Vicodin Es   Tabs (Hydrocodone-Acetaminophen Tabs) .Marland Kitchen.. 1 By Mouth Every 6 Hours As Needed 15)  Fosamax 70 Mg Tabs (Alendronate Sodium) .Marland Kitchen.. 1 By Mouth Weekly.  Allergies (verified): 1)  ! Cardizem 2)  ! Neomycin  Family History: Reviewed history and no changes required. Family History of Colon CA 1st degree relative <60 Family History of Melanoma  Social History: Reviewed history from 12/30/2007 and no changes required. Married Alcohol use-yes Drug use-no Regular exercise-no Retired--nurse Smoking Status:  quit > 6 months Packs/Day:  1.0 Caffeine use/day:  0 Fall Risk:  yes Seat Belt Use:  yes Dental Care w/in 6 mos.:  dentures Sexual History:  currently monogamous  Review of Systems      See HPI General:  Denies chills, fatigue, fever, loss of appetite, malaise, sleep disorder, sweats, weakness, and weight loss. Eyes:  Denies blurring, discharge, double vision, eye irritation, eye pain, halos, itching, light sensitivity, red eye, vision loss-1 eye, and vision loss-both eyes. ENT:  Denies decreased hearing, difficulty swallowing, ear discharge, earache, hoarseness, nasal congestion, nosebleeds, postnasal drainage, ringing in ears, sinus pressure, and sore throat. CV:  Denies bluish discoloration of lips or nails, chest pain or discomfort, difficulty breathing at night, difficulty breathing while lying down, fainting, fatigue, leg cramps with exertion, lightheadness, near fainting, palpitations, shortness of breath with exertion, swelling of feet, swelling of hands, and weight gain. Resp:  Denies chest discomfort, chest pain with  inspiration, cough, coughing up blood, excessive snoring, hypersomnolence, morning headaches, pleuritic, shortness of breath, sputum productive, and wheezing. GI:  Denies abdominal pain, bloody stools, change in bowel habits, constipation, dark tarry stools, diarrhea, excessive appetite, gas, hemorrhoids, indigestion, and loss of appetite. GU:  Denies abnormal vaginal bleeding, decreased libido, discharge, dysuria, genital sores, hematuria, incontinence, nocturia, urinary frequency, and urinary hesitancy. MS:  Complains of joint pain; denies joint redness, joint swelling, loss of strength, muscle aches, muscle, cramps, muscle weakness, and stiffness; ortho-- Dr Lajoyce Corners . Derm:  Denies changes in color of skin, changes in nail beds, dryness, excessive perspiration, flushing, hair loss, insect bite(s), itching, lesion(s), poor wound healing, and rash; derm q1y. Neuro:  Complains of poor balance; denies brief paralysis, difficulty with concentration, disturbances in coordination, falling down, headaches, inability to speak, memory loss, numbness, seizures, sensation of room spinning, tingling, tremors,  visual disturbances, and weakness. Psych:  Denies alternate hallucination ( auditory/visual), anxiety, depression, easily angered, easily tearful, irritability, mental problems, panic attacks, sense of great danger, suicidal thoughts/plans, thoughts of violence, unusual visions or sounds, and thoughts /plans of harming others. Endo:  Denies cold intolerance, excessive hunger, excessive thirst, excessive urination, heat intolerance, polyuria, and weight change. Heme:  Denies abnormal bruising, bleeding, enlarge lymph nodes, fevers, pallor, and skin discoloration. Allergy:  Denies hives or rash, itching eyes, persistent infections, seasonal allergies, and sneezing.  Physical Exam  General:  Well-developed,well-nourished,in no acute distress; alert,appropriate and cooperative throughout  examinationoverweight-appearing.   Head:  Normocephalic and atraumatic without obvious abnormalities. No apparent alopecia or balding. Eyes:  pupils equal, pupils round, and pupils reactive to light.   Ears:  External ear exam shows no significant lesions or deformities.  Otoscopic examination reveals clear canals, tympanic membranes are intact bilaterally without bulging, retraction, inflammation or discharge. Hearing is grossly normal bilaterally. Nose:  External nasal examination shows no deformity or inflammation. Nasal mucosa are pink and moist without lesions or exudates. Mouth:  Oral mucosa and oropharynx without lesions or exudates.  dentures Neck:  No deformities, masses, or tenderness noted.no carotid bruits.   Chest Wall:  No deformities, masses, or tenderness noted. Breasts:  No mass, nodules, thickening, tenderness, bulging, retraction, inflamation, nipple discharge or skin changes noted.   Lungs:  Normal respiratory effort, chest expands symmetrically. Lungs are clear to auscultation, no crackles or wheezes. Heart:  Grade  2 /6 systolic ejection murmur.  normal rate and Grade   /6 systolic ejection murmur.   Abdomen:  Bowel sounds positive,abdomen soft and non-tender without masses, organomegaly or hernias noted. Msk:  B/L knee pain pt walks with walker  Pulses:  R posterior tibial normal, R dorsalis pedis normal, R carotid normal, L posterior tibial normal, L dorsalis pedis normal, and L carotid normal.   Extremities:  trace left pedal edema and 1+ right pedal edema.   Neurologic:  alert & oriented X3 and cranial nerves II-XII intact.   Skin:  mult AK Cervical Nodes:  No lymphadenopathy noted Axillary Nodes:  No palpable lymphadenopathy Psych:  Cognition and judgment appear intact. Alert and cooperative with normal attention span and concentration. No apparent delusions, illusions, hallucinations   Impression & Recommendations:  Problem # 1:  PREVENTIVE HEALTH CARE  (ICD-V70.0) [facility] UTD Orders: Venipuncture (81191) TLB-B12 + Folate Pnl (47829_56213-Y86/VHQ) TLB-IBC Pnl (Iron/FE;Transferrin) (83550-IBC) TLB-Lipid Panel (80061-LIPID) TLB-BMP (Basic Metabolic Panel-BMET) (80048-METABOL) TLB-CBC Platelet - w/Differential (85025-CBCD) TLB-Hepatic/Liver Function Pnl (80076-HEPATIC) TLB-TSH (Thyroid Stimulating Hormone) (84443-TSH) T-Vitamin D (25-Hydroxy) (46962-95284) Specimen Handling (13244) UA Dipstick w/o Micro (manual) (81002) Medicare -1st Annual Wellness Visit [phone])  Problem # 2:  OSTEOPENIA (ICD-733.90)  Her updated medication list for this problem includes:    Fosamax 70 Mg Tabs (Alendronate sodium) .Marland Kitchen... 1 by mouth weekly.  Orders: Venipuncture (25366) TLB-B12 + Folate Pnl (44034_74259-D63/OVF) TLB-IBC Pnl (Iron/FE;Transferrin) (83550-IBC) TLB-Lipid Panel (80061-LIPID) TLB-BMP (Basic Metabolic Panel-BMET) (80048-METABOL) TLB-CBC Platelet - w/Differential (85025-CBCD) TLB-Hepatic/Liver Function Pnl (80076-HEPATIC) TLB-TSH (Thyroid Stimulating Hormone) (84443-TSH) T-Vitamin D (25-Hydroxy) (64332-95188) Specimen Handling (41660)  Vit D:38 (09/20/2008)  Bone Density: osteopenia (05/06/2009) Vit D:38 (09/20/2008)  Problem # 3:  HYPOTHYROIDISM (ICD-244.9)  Her updated medication list for this problem includes:    Synthroid 112 Mcg Tabs (Levothyroxine sodium) .Marland Kitchen... Take one tablet daily-needs labs done now  Orders: Venipuncture (63016) TLB-B12 + Folate Pnl (01093_23557-D22/GUR) TLB-IBC Pnl (Iron/FE;Transferrin) (83550-IBC) TLB-Lipid Panel (80061-LIPID) TLB-BMP (Basic Metabolic Panel-BMET) (80048-METABOL) TLB-CBC Platelet - w/Differential (85025-CBCD)  TLB-Hepatic/Liver Function Pnl (80076-HEPATIC) TLB-TSH (Thyroid Stimulating Hormone) (84443-TSH) T-Vitamin D (25-Hydroxy) (65784-69629) Specimen Handling (52841)  Labs Reviewed: TSH: 4.43 (09/20/2008)    Chol: 148 (09/20/2008)   HDL: 57.10 (09/20/2008)   LDL: 70 (09/20/2008)    TG: 106.0 (09/20/2008)  Problem # 4:  HYPERTENSION (ICD-401.9)  The following medications were removed from the medication list:    Lasix 40 Mg Tabs (Furosemide) .Marland Kitchen... Take one tablet prn Her updated medication list for this problem includes:    Diovan Hct 160-25 Mg Tabs (Valsartan-hydrochlorothiazide) .Marland Kitchen... Take one tablet by mouth daily*due for office visit**    Atenolol 25 Mg Tabs (Atenolol) .Marland Kitchen... 1 by mouth once daily  Orders: Venipuncture (32440) TLB-B12 + Folate Pnl (10272_53664-Q03/KVQ) TLB-IBC Pnl (Iron/FE;Transferrin) (83550-IBC) TLB-Lipid Panel (80061-LIPID) TLB-BMP (Basic Metabolic Panel-BMET) (80048-METABOL) TLB-CBC Platelet - w/Differential (85025-CBCD) TLB-Hepatic/Liver Function Pnl (80076-HEPATIC) TLB-TSH (Thyroid Stimulating Hormone) (84443-TSH) T-Vitamin D (25-Hydroxy) (25956-38756) Specimen Handling (43329)  BP today: 110/42 Prior BP: 128/80 (01/03/2009)  Labs Reviewed: K+: 4.4 (09/20/2008) Creat: : 0.7 (09/20/2008)   Chol: 148 (09/20/2008)   HDL: 57.10 (09/20/2008)   LDL: 70 (09/20/2008)   TG: 106.0 (09/20/2008)  Problem # 5:  HYPERLIPIDEMIA (ICD-272.4)  Her updated medication list for this problem includes:    Vytorin 10-20 Mg Tabs (Ezetimibe-simvastatin) .Marland Kitchen... Take one tablet at bedtime-needs labs  Labs Reviewed: SGOT: 22 (09/20/2008)   SGPT: 14 (09/20/2008)   HDL:57.10 (09/20/2008), 48.8 (01/04/2008)  LDL:70 (09/20/2008), 64 (01/04/2008)  Chol:148 (09/20/2008), 140 (01/04/2008)  Trig:106.0 (09/20/2008), 138 (01/04/2008)  Orders: Venipuncture (51884) TLB-B12 + Folate Pnl (16606_30160-F09/NAT) TLB-IBC Pnl (Iron/FE;Transferrin) (83550-IBC) TLB-Lipid Panel (80061-LIPID) TLB-BMP (Basic Metabolic Panel-BMET) (80048-METABOL) TLB-CBC Platelet - w/Differential (85025-CBCD) TLB-Hepatic/Liver Function Pnl (80076-HEPATIC) TLB-TSH (Thyroid Stimulating Hormone) (84443-TSH) T-Vitamin D (25-Hydroxy) (55732-20254)  Problem # 6:  B12 DEFICIENCY  (ICD-266.2)  Orders: Venipuncture (27062) TLB-B12 + Folate Pnl (37628_31517-O16/WVP) TLB-IBC Pnl (Iron/FE;Transferrin) (83550-IBC) TLB-Lipid Panel (80061-LIPID) TLB-BMP (Basic Metabolic Panel-BMET) (80048-METABOL) TLB-CBC Platelet - w/Differential (85025-CBCD) TLB-Hepatic/Liver Function Pnl (80076-HEPATIC) TLB-TSH (Thyroid Stimulating Hormone) (84443-TSH) T-Vitamin D (25-Hydroxy) (71062-69485) Specimen Handling (46270)  Complete Medication List: 1)  Zoloft 50 Mg Tabs (Sertraline hcl) .Marland Kitchen.. 1 by mouth once daily 2)  Potassium Chloride Crys Cr 20 Meq Tbcr (Potassium chloride crys cr) .Marland Kitchen.. 1 daily 3)  Pantoprazole Sodium 40 Mg Tbec (Pantoprazole sodium) .... Take one tablet daily 4)  Diovan Hct 160-25 Mg Tabs (Valsartan-hydrochlorothiazide) .... Take one tablet by mouth daily*due for office visit** 5)  Synthroid 112 Mcg Tabs (Levothyroxine sodium) .... Take one tablet daily-needs labs done now 6)  Atenolol 25 Mg Tabs (Atenolol) .Marland Kitchen.. 1 by mouth once daily 7)  Plavix 75 Mg Tabs (Clopidogrel bisulfate) .... Take one tablet daily*due for office visit** 8)  Neurontin 600 Mg Tabs (Gabapentin) .... Take one tablet qhs 9)  Betoptic-s 0.25 % Susp (Betaxolol hcl) .... One drop each eye twice daily 10)  Mobic 15 Mg Tabs (Meloxicam) .... 1/2 - 1 by mouth once daily 11)  Detrol La 4 Mg Cp24 (Tolterodine tartrate) .... Take one tablet daily as needed**due for office visit** 12)  Vytorin 10-20 Mg Tabs (Ezetimibe-simvastatin) .... Take one tablet at bedtime-needs labs 13)  K-lor 20 Meq Pack (Potassium chloride) .... Take one tablet daily 14)  Vicodin Es Tabs (Hydrocodone-acetaminophen tabs) .Marland Kitchen.. 1 by mouth every 6 hours as needed 15)  Fosamax 70 Mg Tabs (Alendronate sodium) .Marland Kitchen.. 1 by mouth weekly.  Patient Instructions: 1)  Please schedule a follow-up appointment in 6 months .  Prescriptions: MOBIC 15 MG TABS (MELOXICAM) 1/2 - 1  by mouth once daily  #30 x 1   Entered and Authorized by:   Loreen Freud  DO   Signed by:   Loreen Freud DO on 10/08/2009   Method used:   Electronically to        [facility] [phone]* (retail)       [address]       [place], [place]  [address]       Ph: [phone]       Fax: [phone]   RxID:   [id number]    EKG  Procedure date:  10/08/2009  Findings:      sinus rhythm 68 bpm    Colonoscopy Result Date:  10/11/2002 Colonoscopy Result:  polyps Colonoscopy Next Due:  Not Indicated PAP Next Due:  Not Indicated Bone Density Result Date:  05/06/2009 Bone Density Result:  osteopenia Bone Density Next Due: 2 yr  Laboratory Results   Urine Tests   Date/Time Reported: October 08, 2009 2:24 PM   Routine Urinalysis   Color: yellow Appearance: Clear Glucose: negative   (Normal Range: Negative) Bilirubin: negative   (Normal Range: Negative) Ketone: negative   (Normal Range: Negative) Spec. Gravity: <1.005   (Normal Range: 1.003-1.035) Blood: negative   (Normal Range: Negative) pH: 7.5   (Normal Range: 5.0-8.0) Protein: negative   (Normal Range: Negative) Urobilinogen: negative   (Normal Range: 0-1) Nitrite: negative   (Normal Range: Negative) Leukocyte Esterace: negative   (Normal Range: Negative)    Comments: Floydene Flock  October 08, 2009 2:24 PM

## 2010-04-01 NOTE — Letter (Signed)
Summary: Home Visit Report/[facility]  Home Visit Report/[facility]   Imported By: Lanelle Bal 04/01/2009 10:03:04  _____________________________________________________________________  External Attachment:    Type:   Image     Comment:   External Document  Appended Document: Home Visit Report/[facility] home health needs last ov note and labs  Appended Document: Home Visit Report/[facility] faxed to janet hausner at [phone]

## 2010-04-03 NOTE — Assessment & Plan Note (Signed)
Summary: review medicines//lch   Vital Signs:  Patient profile:   77 year old female Menstrual status:  hysterectomy Weight:      226.8 pounds BMI:     40.97 Pulse rate:   80 / minute Pulse rhythm:   regular BP sitting:   120 / 60  (right arm) Cuff size:   regular  Vitals Entered By: Almeta Monas CMA Duncan Dull) (March 06, 2010 9:11 AM) CC: wants to review meds   History of Present Illness: Pt here for f/u.  Pt is doing OK--- she would like to come off some of her meds.   Her son is still living with her and she has someone cleaning the house and [facility] comes 1x a month to check VS.    Hyperlipidemia follow-up      This is an 77 year old woman who presents for Hyperlipidemia follow-up.  The patient denies muscle aches, GI upset, abdominal pain, flushing, itching, constipation, diarrhea, and fatigue.  The patient denies the following symptoms: chest pain/pressure, exercise intolerance, dypsnea, palpitations, syncope, and pedal edema.  Compliance with medications (by patient report) has been near 100%.  Dietary compliance has been fair.  The patient reports no exercise.    Hypertension follow-up      The patient also presents for Hypertension follow-up.  The patient denies lightheadedness, urinary frequency, headaches, edema, impotence, rash, and fatigue.  The patient denies the following associated symptoms: chest pain, chest pressure, exercise intolerance, dyspnea, palpitations, syncope, leg edema, and pedal edema.  Compliance with medications (by patient report) has been near 100%.  The patient reports that dietary compliance has been fair.  The patient reports no exercise.  Adjunctive measures currently used by the patient include salt restriction.    Preventive Screening-Counseling & Management  Alcohol-Tobacco     Alcohol drinks/day: 2     Alcohol type: all     Smoking Status: quit > 6 months     Packs/Day: 1.0     Year Started: 1942     Year Quit:  1978  Caffeine-Diet-Exercise     Caffeine use/day: 0     Diet Comments: poor     Diet Counseling: to improve diet; diet is suboptimal     Does Patient Exercise: no  Problems Prior to Update: 1)  Family History of Melanoma  (ICD-V16.8) 2)  Family History of Colon Ca 1st Degree Relative <60  (ICD-V16.0) 3)  Preventive Health Care  (ICD-V70.0) 4)  Osteopenia  (ICD-733.90) 5)  Fall From Other Slipping Tripping or Stumbling  (ICD-E885.9) 6)  Memory Loss  (ICD-780.93) 7)  Numbness, Hand  (ICD-782.0) 8)  Laceration, Scalp  (ICD-873.0) 9)  Uri  (ICD-465.9) 10)  Gait Disturbance  (ICD-781.2) 11)  Uti  (ICD-599.0) 12)  Knee Pain, Right  (ICD-719.46) 13)  Hypothyroidism  (ICD-244.9) 14)  Hypertension  (ICD-401.9) 15)  Hyperlipidemia  (ICD-272.4) 16)  B12 Deficiency  (ICD-266.2)  Medications Prior to Update: 1)  Zoloft 50 Mg  Tabs (Sertraline Hcl) .Marland Kitchen.. 1 By Mouth Once Daily 2)  Pantoprazole Sodium 40 Mg  Tbec (Pantoprazole Sodium) .... Take One Tablet Daily 3)  Diovan Hct 160-25 Mg  Tabs (Valsartan-Hydrochlorothiazide) .... Take One Tablet By Mouth Daily 4)  Synthroid 112 Mcg  Tabs (Levothyroxine Sodium) .... Take One Tablet Daily 5)  Atenolol 25 Mg  Tabs (Atenolol) .Marland Kitchen.. 1 By Mouth Once Daily 6)  Plavix 75 Mg  Tabs (Clopidogrel Bisulfate) .... Take One Tablet Daily 7)  Neurontin 600 Mg  Tabs (Gabapentin) .Marland KitchenMarland KitchenMarland Kitchen  Take One Tablet Qhs 8)  Betoptic-S 0.25 %  Susp (Betaxolol Hcl) .... One Drop Each Eye Twice Daily 9)  Detrol La 4 Mg  Cp24 (Tolterodine Tartrate) .... Take One Tablet Daily As Needed 10)  Lipitor 10 Mg Tabs (Atorvastatin Calcium) .... Take 1 Tab Once Daily 11)  K-Lor 20 Meq  Pack (Potassium Chloride) .... Take One Tablet Daily 12)  Vicodin Es   Tabs (Hydrocodone-Acetaminophen Tabs) .Marland Kitchen.. 1 By Mouth Every 6 Hours As Needed 13)  Fosamax 70 Mg Tabs (Alendronate Sodium) .Marland Kitchen.. 1 By Mouth Weekly. 14)  Ultram 50 Mg Tabs (Tramadol Hcl) .Marland Kitchen.. 1 By Mouth Every 6 Hours As Needed For Pain 15)   Tricor 145 Mg Tabs (Fenofibrate) .... Take 1 By Mouth Once Daily  Current Medications (verified): 1)  Zoloft 50 Mg  Tabs (Sertraline Hcl) .Marland Kitchen.. 1 By Mouth Once Daily 2)  Pantoprazole Sodium 40 Mg  Tbec (Pantoprazole Sodium) .... Take One Tablet Daily 3)  Diovan Hct 160-25 Mg  Tabs (Valsartan-Hydrochlorothiazide) .... Take One Tablet By Mouth Daily 4)  Synthroid 112 Mcg  Tabs (Levothyroxine Sodium) .... Take One Tablet Daily 5)  Atenolol 25 Mg  Tabs (Atenolol) .Marland Kitchen.. 1 By Mouth Once Daily 6)  Plavix 75 Mg  Tabs (Clopidogrel Bisulfate) .... Take One Tablet Daily 7)  Neurontin 600 Mg  Tabs (Gabapentin) .... Take One Tablet Qhs 8)  Betoptic-S 0.25 %  Susp (Betaxolol Hcl) .... One Drop Each Eye Twice Daily 9)  Detrol La 4 Mg  Cp24 (Tolterodine Tartrate) .... Take One Tablet Daily As Needed 10)  Lipitor 10 Mg Tabs (Atorvastatin Calcium) .... Take 1 Tab Once Daily 11)  K-Lor 20 Meq  Pack (Potassium Chloride) .... Take One Tablet Daily 12)  Vicodin Es   Tabs (Hydrocodone-Acetaminophen Tabs) .Marland Kitchen.. 1 By Mouth Every 6 Hours As Needed 13)  Fosamax 70 Mg Tabs (Alendronate Sodium) .Marland Kitchen.. 1 By Mouth Weekly. 14)  Ultram 50 Mg Tabs (Tramadol Hcl) .Marland Kitchen.. 1 By Mouth Every 6 Hours As Needed For Pain 15)  Tricor 145 Mg Tabs (Fenofibrate) .... Take 1 By Mouth Once Daily 16)  Iron 325 (65 Fe) Mg Tabs (Ferrous Sulfate) .... By Mouth Once Daily 17)  One-A-Day Extras Antioxidant  Caps (Multiple Vitamins-Minerals) .... By Mouth Once Daily  Allergies (verified): 1)  ! Cardizem 2)  ! Neomycin  Past History:  Past Medical History: Last updated: 12/17/2009 Hyperlipidemia Hypertension Hypothyroidism Current Problems:  FAMILY HISTORY OF MELANOMA (ICD-V16.8) FAMILY HISTORY OF COLON CA 1ST DEGREE RELATIVE <60 (ICD-V16.0) PREVENTIVE HEALTH CARE (ICD-V70.0) OSTEOPENIA (ICD-733.90) FALL FROM OTHER SLIPPING TRIPPING OR STUMBLING (ICD-E885.9) MEMORY LOSS (ICD-780.93) NUMBNESS, HAND (ICD-782.0) LACERATION, SCALP  (ICD-873.0) URI (ICD-465.9) GAIT DISTURBANCE (ICD-781.2) UTI (ICD-599.0) KNEE PAIN, RIGHT (ICD-719.46) HYPOTHYROIDISM (ICD-244.9) HYPERTENSION (ICD-401.9) HYPERLIPIDEMIA (ICD-272.4) B12 DEFICIENCY (ICD-266.2)  Past Surgical History: Last updated: 02/08/2007 Appendectomy Cholecystectomy Hysterectomy Oophorectomy  Family History: Last updated: 10/08/2009 Family History of Colon CA 1st degree relative <60 Family History of Melanoma  Social History: Last updated: 12/17/2009  Alcohol use-yes Drug use-no Regular exercise-no Retired--nurse Widow/Widower--- husband died January 05, 2010  Risk Factors: Alcohol Use: 2 (03/06/2010) Caffeine Use: 0 (03/06/2010) Diet: poor (03/06/2010) Exercise: no (03/06/2010)  Risk Factors: Smoking Status: quit > 6 months (03/06/2010) Packs/Day: 1.0 (03/06/2010)  Family History: Reviewed history from 10/08/2009 and no changes required. Family History of Colon CA 1st degree relative <60 Family History of Melanoma  Social History: Reviewed history from 12/17/2009 and no changes required.  Alcohol use-yes Drug use-no Regular exercise-no Retired--nurse Widow/Widower--- husband died 05-Jan-2010  Review of Systems      See HPI  Physical Exam  General:  Well-developed,well-nourished,in no acute distress; alert,appropriate and cooperative throughout examination Lungs:  Normal respiratory effort, chest expands symmetrically. Lungs are clear to auscultation, no crackles or wheezes. Heart:  normal rate and Grade  2 /6 systolic ejection murmur.   Extremities:  left pretibial edema and right pretibial edema.   Psych:  Oriented X3 and normally interactive.     Impression & Recommendations:  Problem # 1:  OSTEOPENIA (ICD-733.90)  Her updated medication list for this problem includes:    Fosamax 70 Mg Tabs (Alendronate sodium) .Marland Kitchen... 1 by mouth weekly.  Orders: Venipuncture (60454) TLB-B12 + Folate Pnl (09811_91478-G95/AOZ) TLB-IBC Pnl  (Iron/FE;Transferrin) (83550-IBC) TLB-Lipid Panel (80061-LIPID) TLB-BMP (Basic Metabolic Panel-BMET) (80048-METABOL) TLB-CBC Platelet - w/Differential (85025-CBCD) TLB-Hepatic/Liver Function Pnl (80076-HEPATIC) TLB-TSH (Thyroid Stimulating Hormone) (84443-TSH) TLB-Ferritin (82728-FER) T-Vitamin D (25-Hydroxy) (30865-78469) Specimen Handling (62952) Prescription Created Electronically [id number])  Bone Density: osteopenia (05/06/2009) Vit D:50 (12/23/2009), 40 (10/08/2009)  Problem # 2:  HYPERTENSION (ICD-401.9)  Her updated medication list for this problem includes:    Diovan Hct 160-25 Mg Tabs (Valsartan-hydrochlorothiazide) .Marland Kitchen... Take one tablet by mouth daily    Atenolol 25 Mg Tabs (Atenolol) .Marland Kitchen... 1 by mouth once daily  Orders: Venipuncture (44010) TLB-B12 + Folate Pnl (27253_66440-H47/QQV) TLB-IBC Pnl (Iron/FE;Transferrin) (83550-IBC) TLB-Lipid Panel (80061-LIPID) TLB-BMP (Basic Metabolic Panel-BMET) (80048-METABOL) TLB-CBC Platelet - w/Differential (85025-CBCD) TLB-Hepatic/Liver Function Pnl (80076-HEPATIC) TLB-TSH (Thyroid Stimulating Hormone) (84443-TSH) TLB-Ferritin (82728-FER) T-Vitamin D (25-Hydroxy) (95638-75643) Specimen Handling (32951) Prescription Created Electronically [id number])  BP today: 120/60 Prior BP: 118/60 (12/17/2009)  Labs Reviewed: K+: 4.1 (12/23/2009) Creat: : 0.7 (12/23/2009)   Chol: 196 (12/23/2009)   HDL: 44.60 (12/23/2009)   LDL: 70 (09/20/2008)   TG: 219.0 (12/23/2009)  Problem # 3:  HYPERLIPIDEMIA (ICD-272.4)  Her updated medication list for this problem includes:    Lipitor 10 Mg Tabs (Atorvastatin calcium) .Marland Kitchen... Take 1 tab once daily    Tricor 145 Mg Tabs (Fenofibrate) .Marland Kitchen... Take 1 by mouth once daily  Orders: Venipuncture (60630) TLB-B12 + Folate Pnl (16010_93235-T73/UKG) TLB-IBC Pnl (Iron/FE;Transferrin) (83550-IBC) TLB-Lipid Panel (80061-LIPID) TLB-BMP (Basic Metabolic Panel-BMET) (80048-METABOL) TLB-CBC Platelet - w/Differential  (85025-CBCD) TLB-Hepatic/Liver Function Pnl (80076-HEPATIC) TLB-TSH (Thyroid Stimulating Hormone) (84443-TSH) TLB-Ferritin (82728-FER) T-Vitamin D (25-Hydroxy) (25427-06237) Specimen Handling (62831) Prescription Created Electronically [id number])  Labs Reviewed: SGOT: 20 (12/23/2009)   SGPT: 14 (12/23/2009)   HDL:44.60 (12/23/2009), 45.00 (10/08/2009)  LDL:70 (09/20/2008), 64 (01/04/2008)  Chol:196 (12/23/2009), 248 (10/08/2009)  Trig:219.0 (12/23/2009), 224.0 (10/08/2009)  Problem # 4:  HYPOTHYROIDISM (ICD-244.9)  Her updated medication list for this problem includes:    Synthroid 112 Mcg Tabs (Levothyroxine sodium) .Marland Kitchen... Take one tablet daily  Orders: Venipuncture (60737) TLB-B12 + Folate Pnl (10626_94854-O27/OJJ) TLB-IBC Pnl (Iron/FE;Transferrin) (83550-IBC) TLB-Lipid Panel (80061-LIPID) TLB-BMP (Basic Metabolic Panel-BMET) (80048-METABOL) TLB-CBC Platelet - w/Differential (85025-CBCD) TLB-Hepatic/Liver Function Pnl (80076-HEPATIC) TLB-TSH (Thyroid Stimulating Hormone) (84443-TSH) TLB-Ferritin (82728-FER) T-Vitamin D (25-Hydroxy) (00938-18299) Specimen Handling (37169) Prescription Created Electronically [id number])  Labs Reviewed: TSH: 5.55 (12/23/2009)    Chol: 196 (12/23/2009)   HDL: 44.60 (12/23/2009)   LDL: 70 (09/20/2008)   TG: 219.0 (12/23/2009)  Complete Medication List: 1)  Zoloft 50 Mg Tabs (Sertraline hcl) .Marland Kitchen.. 1 by mouth once daily 2)  Pantoprazole Sodium 40 Mg Tbec (Pantoprazole sodium) .... Take one tablet daily 3)  Diovan Hct 160-25 Mg Tabs (Valsartan-hydrochlorothiazide) .... Take one tablet by mouth daily 4)  Synthroid 112 Mcg Tabs (Levothyroxine sodium) .... Take one tablet daily 5)  Atenolol 25 Mg Tabs (  Atenolol) .Marland Kitchen.. 1 by mouth once daily 6)  Plavix 75 Mg Tabs (Clopidogrel bisulfate) .... Take one tablet daily 7)  Neurontin 600 Mg Tabs (Gabapentin) .... Take one tablet qhs 8)  Betoptic-s 0.25 % Susp (Betaxolol hcl) .... One drop each eye twice daily 9)   Detrol La 4 Mg Cp24 (Tolterodine tartrate) .... Take one tablet daily as needed 10)  Lipitor 10 Mg Tabs (Atorvastatin calcium) .... Take 1 tab once daily 11)  K-lor 20 Meq Pack (Potassium chloride) .... Take one tablet daily 12)  Vicodin Es Tabs (Hydrocodone-acetaminophen tabs) .Marland Kitchen.. 1 by mouth every 6 hours as needed 13)  Fosamax 70 Mg Tabs (Alendronate sodium) .Marland Kitchen.. 1 by mouth weekly. 14)  Ultram 50 Mg Tabs (Tramadol hcl) .Marland Kitchen.. 1 by mouth every 6 hours as needed for pain 15)  Tricor 145 Mg Tabs (Fenofibrate) .... Take 1 by mouth once daily 16)  Iron 325 (65 Fe) Mg Tabs (Ferrous sulfate) .... By mouth once daily 17)  One-a-day Extras Antioxidant Caps (Multiple vitamins-minerals) .... By mouth once daily  Patient Instructions: 1)  Please schedule a follow-up appointment in 6 months-- cpe   Orders Added: 1)  Venipuncture [36415] 2)  TLB-B12 + Folate Pnl [82746_82607-B12/FOL] 3)  TLB-IBC Pnl (Iron/FE;Transferrin) [83550-IBC] 4)  TLB-Lipid Panel [80061-LIPID] 5)  TLB-BMP (Basic Metabolic Panel-BMET) [80048-METABOL] 6)  TLB-CBC Platelet - w/Differential [85025-CBCD] 7)  TLB-Hepatic/Liver Function Pnl [80076-HEPATIC] 8)  TLB-TSH (Thyroid Stimulating Hormone) [84443-TSH] 9)  TLB-Ferritin [82728-FER] 10)  T-Vitamin D (25-Hydroxy) [65784-69629] 11)  Specimen Handling [99000] 12)  Prescription Created Electronically [G8553] 13)  Est. Patient Level IV [52841]

## 2010-04-03 NOTE — Medication Information (Signed)
Summary: Care Consideration Regarding Sertraline/[facility]  Care Consideration Regarding Sertraline/[facility]   Imported By: Lanelle Bal 03/06/2010 08:53:43  _____________________________________________________________________  External Attachment:    Type:   Image     Comment:   External Document

## 2010-04-04 NOTE — Letter (Signed)
Summary: Letter Regarding SCAT Certification/[facility]  Letter Regarding SCAT Certification/[facility]   Imported By: Lanelle Bal 03/19/2009 10:53:54  _____________________________________________________________________  External Attachment:    Type:   Image     Comment:   External Document

## 2010-04-17 NOTE — Medication Information (Signed)
Summary: Letter Regarding Sertraline/[facility]  Letter Regarding Sertraline/[facility]   Imported By: Lanelle Bal 04/09/2010 08:28:03  _____________________________________________________________________  External Attachment:    Type:   Image     Comment:   External Document

## 2010-06-24 ENCOUNTER — Ambulatory Visit (INDEPENDENT_AMBULATORY_CARE_PROVIDER_SITE_OTHER): Payer: Medicare Other | Admitting: Family Medicine

## 2010-06-24 ENCOUNTER — Encounter: Payer: Self-pay | Admitting: Family Medicine

## 2010-06-24 ENCOUNTER — Other Ambulatory Visit: Payer: Self-pay | Admitting: Family Medicine

## 2010-06-24 ENCOUNTER — Ambulatory Visit (HOSPITAL_BASED_OUTPATIENT_CLINIC_OR_DEPARTMENT_OTHER)
Admission: RE | Admit: 2010-06-24 | Discharge: 2010-06-24 | Disposition: A | Discharge status: Home / Self Care | Payer: Medicare Other | Source: Ambulatory Visit | Attending: Family Medicine | Admitting: Family Medicine

## 2010-06-24 DIAGNOSIS — R0989 Other specified symptoms and signs involving the circulatory and respiratory systems: Secondary | ICD-10-CM | POA: Insufficient documentation

## 2010-06-24 DIAGNOSIS — J40 Bronchitis, not specified as acute or chronic: Secondary | ICD-10-CM

## 2010-06-24 DIAGNOSIS — M8448XA Pathological fracture, other site, initial encounter for fracture: Secondary | ICD-10-CM | POA: Insufficient documentation

## 2010-06-24 DIAGNOSIS — R05 Cough: Secondary | ICD-10-CM | POA: Insufficient documentation

## 2010-06-24 DIAGNOSIS — J329 Chronic sinusitis, unspecified: Secondary | ICD-10-CM

## 2010-06-24 DIAGNOSIS — R059 Cough, unspecified: Secondary | ICD-10-CM | POA: Insufficient documentation

## 2010-06-24 MED ORDER — AMOXICILLIN-POT CLAVULANATE 875-125 MG PO TABS: 1.0000 | ORAL_TABLET | Freq: Two times a day (BID) | ORAL | Status: AC

## 2010-06-24 MED ORDER — PREDNISOLONE SODIUM PHOSPHATE 10 MG PO TBDP: ORAL_TABLET | ORAL | Status: DC

## 2010-06-24 MED ORDER — METHYLPREDNISOLONE ACETATE 80 MG/ML IJ SUSP
80.0000 mg | Freq: Once | INTRAMUSCULAR | Status: AC
  Administered 2010-06-24: 80 mg via INTRAMUSCULAR

## 2010-06-24 NOTE — Progress Notes (Signed)
  Subjective:     Kirsten Flores is a 77 y.o. female who presents for evaluation of symptoms of a URI. Symptoms include congestion, facial pain, nasal congestion, productive cough with  green colored sputum, purulent nasal discharge, shortness of breath and wheezing. Onset of symptoms was 4 days ago, and has been gradually worsening since that time. Treatment to date: cough suppressants.  The following portions of the patient's history were reviewed and updated as appropriate: allergies, current medications, past family history, past medical history, past social history, past surgical history and problem list.  Review of Systems Pertinent items are noted in HPI.   Objective:    BP 106/58  Pulse 84  Temp(Src) 98.5 F (36.9 C) (Oral)  Wt 226 lb (102.513 kg)  SpO2 91% General appearance: alert, cooperative, appears stated age and mild distress Ears: normal TM's and external ear canals both ears Nose: Nares normal. Septum midline. Mucosa normal. No drainage or sinus tenderness., sinus tenderness bilateral Throat: lips, mucosa, and tongue normal; teeth and gums normal Neck: no adenopathy, no carotid bruit, no JVD, supple, symmetrical, trachea midline and thyroid not enlarged, symmetric, no tenderness/mass/nodules Lungs: wheezes bilaterally Heart: regular rate and rhythm Extremities: extremities normal, atraumatic, no cyanosis or edema Lymph nodes: Cervical, supraclavicular, and axillary nodes normal.   Assessment:    bronchitis and sinusitis   Plan:    Discussed diagnosis and treatment of URI. Suggested symptomatic OTC remedies. Nasal saline spray for congestion. Augmentin per orders. Follow up as needed. Check cxr --f/u 2 weeks or sooner prn

## 2010-06-24 NOTE — Patient Instructions (Signed)
Bronchitis Bronchitis is the body's way of reacting to injury and/or infection (inflammation) of the bronchi. Bronchi are the air tubes that extend from the windpipe into the lungs. If the inflammation becomes severe, it may cause shortness of breath.  CAUSES Inflammation may be caused by:  A virus.   Germs (bacteria).   Dust.   Allergens.   Pollutants and many other irritants.  The cells lining the bronchial tree are covered with tiny hairs (cilia). These constantly beat upward, away from the lungs, toward the mouth. This keeps the lungs free of pollutants. When these cells become too irritated and are unable to do their job, mucus begins to develop. This causes the characteristic cough of bronchitis. The cough clears the lungs when the cilia are unable to do their job. Without either of these protective mechanisms, the mucus would settle in the lungs. Then you would develop pneumonia. Smoking is a common cause of bronchitis and can contribute to pneumonia. Stopping this habit is the single most important thing you can do to help yourself. TREATMENT  Your caregiver may prescribe an antibiotic if the cough is caused by bacteria. Also, medicines that open up your airways make it easier to breathe. Your caregiver may also recommend or prescribe an expectorant. It will loosen the mucus to be coughed up. Only take over-the-counter or prescription medicines for pain, discomfort, or fever as directed by your caregiver.   Removing whatever causes the problem (smoking, for example) is critical to preventing the problem from getting worse.   Cough suppressants may be prescribed for relief of cough symptoms.   Inhaled medicines may be prescribed to help with symptoms now and to help prevent problems from returning.   For those with recurrent (chronic) bronchitis, there may be a need for steroid medicines.  SEEK IMMEDIATE MEDICAL CARE IF:  During treatment, you develop more pus-like mucus  (purulent sputum).   You or your child has an oral temperature above 100.4, not controlled by medicine.   Your baby is older than 3 months with a rectal temperature of 102 F (38.9 C) or higher.   Your baby is 3 months old or younger with a rectal temperature of 100.4 F (38 C) or higher.   You become progressively more ill.   You have increased difficulty breathing, wheezing, or shortness of breath.  It is necessary to seek immediate medical care if you are elderly or sick from any other disease. MAKE SURE YOU:  Understand these instructions.   Will watch your condition.   Will get help right away if you are not doing well or get worse.  Document Released: 02/16/2005 Document Re-Released: 05/13/2009 [employer] Patient Information 2011 [employer]. 

## 2010-06-25 ENCOUNTER — Encounter: Payer: Self-pay | Admitting: Family Medicine

## 2010-06-25 NOTE — Progress Notes (Signed)
Spoke with patient and made her aware of her Cxr results, she stated she feels better today, I can still hear her wheezing over the phone and advised if she starts to feel really bad again to either go to the hospital or call us  Back if it is during business hours, She agreed. I asked about the back pain and she stated she was having some pain, advised her of the compression fractured and she stated she has had it, had surgery and has rods in her back, I advised I would let Dr.Lowne know, she voiced understanding     KP

## 2010-07-08 ENCOUNTER — Encounter: Payer: Self-pay | Admitting: Family Medicine

## 2010-07-08 ENCOUNTER — Ambulatory Visit (INDEPENDENT_AMBULATORY_CARE_PROVIDER_SITE_OTHER): Payer: Medicare Other | Admitting: Family Medicine

## 2010-07-08 VITALS — BP 108/68 | HR 81 | Temp 98.5°F | Wt 218.2 lb

## 2010-07-08 DIAGNOSIS — J40 Bronchitis, not specified as acute or chronic: Secondary | ICD-10-CM

## 2010-07-08 MED ORDER — TRAMADOL HCL 50 MG PO TABS: 50.0000 mg | ORAL_TABLET | Freq: Four times a day (QID) | ORAL | Status: DC | PRN

## 2010-07-08 NOTE — Progress Notes (Signed)
  Subjective:    Patient ID: Kirsten Flores, female    DOB: [birth year], 77 y.o.   MRN: [id number]  HPI Pt here f/u bronchitis--pt feeling much better.  No complaints.    Review of Systems As above    Objective:   Physical Exam  Constitutional: She is oriented to person, place, and time. She appears well-developed and well-nourished.  Neck: Normal range of motion. Neck supple.  Cardiovascular: Normal rate and regular rhythm.   Pulmonary/Chest: Effort normal and breath sounds normal.  Neurological: She is alert and oriented to person, place, and time.  Psychiatric: She has a normal mood and affect. Her behavior is normal.          Assessment & Plan:

## 2010-07-08 NOTE — Assessment & Plan Note (Signed)
Finish abx Improving rto if symptoms return--otherwise rto 3 months

## 2010-07-15 ENCOUNTER — Telehealth: Payer: Self-pay | Admitting: *Deleted

## 2010-07-15 NOTE — Telephone Encounter (Signed)
Pt status report: Pt is hypotension today at 88/40, 90/40 with manual cuff and with Pt automatic monitor 89/48. Nurse advise Pt to increase fluid intake and recheck BP in a couple of hours and if BP continue to drop needs to call. Pt is asymptomatic denies any fatigue, or dizziness. Pt was Dx with sinus infection 3 weeks ago. O2 93 on room air. Pt has upper right lobe fine crackling.

## 2010-07-15 NOTE — Telephone Encounter (Signed)
Spoke with patient and she stated she does not think she has pneumonia, she stated she is the same as last week the nurse was concerned so she called, she stated she feels ok and will not go to the hospital. She stated if she started to feel bad she would go to the hospital.... I made Dr.Lowne aware     KP

## 2010-07-15 NOTE — Telephone Encounter (Signed)
Sounds like she may have pneumonia--she should go to ER

## 2010-07-17 ENCOUNTER — Other Ambulatory Visit: Payer: Self-pay | Admitting: Family Medicine

## 2010-07-18 NOTE — Consult Note (Signed)
[place]  Patient:    Kirsten Flores                       MRN: [id number] Proc. Date: 03/07/99 Adm. Date:  [id number] Attending:  Anastasio Auerbach Dictator:   Westly Pam. Iran Planas, M.D.                          Consultation Report  CHIEF COMPLAINT:  Right hand weakness.  HISTORY OF PRESENT ILLNESS:  The patient is a 77 year old woman who earlier this morning around 5:00 a.m. noted weakness of her right hand.  She could not comb er hair, hold a cup of water, or able to lift the covers from her bed.  Along with the symptoms she has denied any other neurologic deficits.  No slurred speech, no weakness of her right lower extremity, no difficulty walking, and no double vision. She denies prior history of strokes or TIAs.  PAST MEDICAL HISTORY:  Significant for hypertension, COPD, arthritis, chronic vertigo, postmenopausal, hypothyroidism, glaucoma, and cataracts.  PAST SURGICAL HISTORY:  Abdominal hysterectomy, tonsillectomy, hemorrhoidectomy, and left leg vein ligation and stripping x two.  MEDICATIONS:  Synthroid, Hyzaar, Vioxx, Ultram, Atenolol, Premarin, aspirin, and Betoptic.  ALLERGIES:  She is ALLERGIC TO CARDIZEM AND NEOMYCIN OPHTHALMIC DROPS.  FAMILY HISTORY:   Her mother had cancer and died at age 47.  Her dad had cancer as well, bone cancer.  Sister with lung cancer.  REVIEW OF SYSTEMS:  As per History of Present Illness.  PHYSICAL EXAMINATION:  VITAL SIGNS:  Blood pressure 145/64, pulse 65, respirations 20, temperature 97.6 degrees.  GENERAL:  Obese woman lying on the stretcher in no distress.  HEENT:  Head normocephalic, atraumatic.  NECK:  Supple, no bruits.  LUNGS:  Clear bilaterally.  HEART:  Heart sounds regular and rhythmic.  No murmurs.  ABDOMEN:  Soft.  Bowel sounds present.  No hepatosplenomegaly.  No tenderness.  NEUROLOGIC:  She is awake and alert, oriented.  Speech is fluent.  Memory and language appropriate  for age.  Cranial nerves II-XII:  PERRLA.  Extraocular cephalic movement present.  Face symmetric.  Visual fields full to confrontation. Tongue midline.  Palate elevated symmetrically.  Motor examination:  Strength decreased in right hand with especially fine motor control and slightly diminished grip.  This has improved per patients report since this morning.  There is no drift or pronation of the right upper extremity.  The rest of her limbs are 5/5  bilaterally. DTRs are +1 throughout.  Plantars downgoing.  Sensory intact to touch and pinprick.  Coordination normal.  LABORATORY DATA:  Neuro imaging:  I have personally reviewed CT scan of the patients brain, which shows atrophy and no acute ischemic changes.  WBC 8.6, hemoglobin 12, hematocrit 34.7, platelet count 269,000; Differential showed neutrophils 59%, lymphocytes 29%.  Sodium 133, potassium 4.2, chloride 100, bicarbonate 27, BUN 12, creatinine 0.6, glucose 99.  AST 22, ALT 18.  IMPRESSION: 1.  Left middle cerebral artery infarction, branch occlusion. 2.  Hypertension. 3.  Osteoarthritis. 4.  Hypothyroidism. 5.  Chronic obstructive pulmonary disease. 6.  Chronic vertigo, positional.  RECOMMENDATIONS:  Will discuss labs with the patient.  The patient is admitted o the hospital and orders have been written by Dr. Anastasio Auerbach in regard to further  work-up and testing for cerebrovascular disease.  Patient management will consist of hemodynamic support with IV fluids, normal saline - an  hour, oxygen 2 liters, and heparin ischemic stroke protocol.  Further testing will include noninvasive  imaging of the internal and external carotid vessels with MRI/MRA of the brain nd cardiac evaluation with 2-D echocardiogram for studies of potential embolic sources as a cause of stroke.  Will advise further in regard to long-term secondary stroke prevention.  Occupational therapy is also recommended.  Thank you, and I appreciate  participating in the care of this patient.  DD: 03/07/99 TD:  03/07/99 Job: [id number]

## 2010-07-18 NOTE — Op Note (Signed)
[place]  Patient:    Kirsten Flores, ROTUNNO Visit Number: [id number] MRN: [id number]          Service Type: SUR Location: [id number] Attending Physician:  Nadara Mustard Dictated by:   Veverly Fells Ophelia Charter, M.D. Proc. Date: 07/06/01 Admit Date:  07/06/2001 Discharge Date: 07/11/2001                             Operative Report  PREOPERATIVE DIAGNOSIS:  L4-5 stenosis with right L5-S1 herniated nucleus pulposus with radiculopathy.  POSTOPERATIVE DIAGNOSIS:  L4-5 stenosis with right L5-S1 herniated nucleus pulposus with radiculopathy.  OPERATION PERFORMED:  Right L4-5 foraminotomy, right L5 microdiskectomy, removal of herniated nucleus pulposus and right complete L5 laminectomy.  SURGEON:  Mark C. Ophelia Charter, M.D.  ASSISTANT:  Zonia Kief, PA-C  ANESTHESIA:  General plus Marcaine skin local.  ESTIMATED BLOOD LOSS:  100 ml.  DESCRIPTION OF PROCEDURE:  After induction of general anesthesia, preoperative Ancef prophylaxis, the patient was placed on Andrews frame, prepped.  The area was squared with towels, Betadine Vi-drape applied and laminectomy sheets and drapes were applied.  Needle localization was obtained with crosstable lateral x-ray with spinal needle placed just below the L4-5 level.  Incision was made at the midline.  Subperiosteal dissection on the spinous process and on the lamina and a deep Taylor retractor was placed due to the patients obese size. Bone removal started at the L4-5 disk space.  Complete laminectomy was performed at L5 on the right.  Kerrison rongeur was used initially 3 mm and then the 4 and 5 mm to remove the lamina on the right at L5.  There was hypertrophic ligament and once the lamina was completely removed, the dural separator was used to peel the ligament up.  It was incised with the scalpel and removed with a Kerrison rongeur  using half x half patties and 1 x 1 patties to protect the dura.  There was facet  overgrowth and the facets were encroaching toward the midline.  Facet overhand osteophytes were removed out to the level of the pedicle and the lateral gutter was run all the way up.  At this point the L5 nerve root was visualized at the top portion of the decompression.  Further bone was removed at the inferior aspect of L4 performing a laminotomy on the right at L4.  Palpation of the disk revealed no disk herniation at L4.  The lamina was completely removed at L5.  Nerve root L5 on the right was free as it exited in the axilla and following it distally along the gutter down to the L5-S1 disk. There was bulging in the L5-S1 disk noted.  The annulus was incised with the 15 scalpel blade and a micropituitary was used initially followed by straight pituitary to remove a small amount of disk.  #1 Epstein curet was used just underneath the annulus on the right side laterally and pushed down and immediately as this was pushed down, a large fragment of disk began to extrude out through the defect.  This was grasped with a downbiting pituitary rongeurs and removed  removing a chunk of disk that was 2 x 1 cm.  Immediately after this, a hockey stick could be placed out laterally over the edge of the  disk and there was significant decompression.  Further passes were made inside the disk and one more large chunk that was removed with was 0.5  x 0.5 cm. Palpation with the hockey stick inside revealed that the remaining annulus was intact.  Using pituitary, the anterior portion of the disk was also intact and remaining small pieces of disk were removed.  Foraminotomy was done for the S1 nerve root removing the top portion of the S1 lamina enlarging the foramina and palpation around the S1 nerve root revealed no areas of compression, no fragments out the foramina and the nerve root was free.  There was a 3 mm space all along the lateral gutter between the pedicle and the edge of the decompressed dura.   Both nerve roots were reinspected and careful palpation on all sides of the nerve root was performed and both nerve roots were fully visualized with no areas of compression.  Final palpation out laterally was performed and there were no areas of far lateral compression after removal of a large chunk of disk.  The wound was irrigated copiously.  The fascia was closed with 0 Vicryl in the fascia, 2-0 Vicryl in the subcutaneous tissues. skin staple closure, Marcaine infiltration in the skin, Adaptic, 4 x 4s, ABD and tape dressing.  The patient was transferred to recovery room in satisfactory condition.  Instrument count and needle count were correct and the patient was neurologically intact. Dictated by:   Veverly Fells Ophelia Charter, M.D. Attending Physician:  Nadara Mustard DD:  07/08/01 TD:  07/11/01 Job: [id number]

## 2010-07-18 NOTE — Discharge Summary (Signed)
NAMEHENCHY, MCCAULEY NO.:  [id number]   MEDICAL RECORD NO.:  [id number]          PATIENT TYPE:  INP   LOCATION:  5005                         FACILITY:  [facility]   PHYSICIAN:  Rene Paci, M.D. LHCDATE OF BIRTH:  1923/11/29   DATE OF ADMISSION:  04/18/2004  DATE OF DISCHARGE:  04/23/2004                                 DISCHARGE SUMMARY   DISCHARGE DIAGNOSES:  1.  Left lower extremity cellulitis.  2.  Respiratory insufficiency.  3.  Acute exacerbation chronic obstructive pulmonary disease.  4.  Sinus tachycardia.  5.  Dehydration.  6.  Hypertension.  7.  Hyponatremia.  8.  Hyperkalemia.  9.  Hyperglycemia.   BRIEF ADMISSION HISTORY:  [name] is a 77 year old white female who  presented with dyspnea.  Onset Monday.  She complained of fever and chills  since Monday.  She denied nausea, vomiting, diarrhea or productive cough.  She has had only a minimal cough.  She did complain of back pain and pain in  her knee.  No chest pain.  Questionable epigastric discomfort.  She also  complained of headache.  She denied any pleuritic pain.  She also noted  onset of left lower extremity edema a couple of days ago.  The patient  attributed redness to the gown and girdle she had been wearing earlier in  the week.  She denies any burn or trauma and no recent injuries or fall.   PAST MEDICAL HISTORY:  1.  Left parietal CVA in January 2001.  2.  Degenerative disc disease.  3.  Hypertension.  4.  COPD.  5.  Vertigo.  6.  Hypothyroidism.  7.  Gastroesophageal reflux disease.  8.  Depression.  9.  Glaucoma.  10. Cataracts.  11. Status post total abdominal hysterectomy.  12. History of colonic polyps.  13. Status post hemorrhoidectomy.  14. Lumbar stenosis with radiculopathy, status post laminectomy in May 2003.  15. Peripheral neuropathy.   HOSPITAL COURSE:  PROBLEM #1 -  INFECTIOUS DISEASE:  The patient presented  with fever and a mild leukocytosis.  She had  evidence of cellulitis.  She  was empirically started on Unasyn and vancomycin.  We did obtain blood  cultures which were negative.  We also performed lower extremity venous  Doppler to rule out DVT which was negative.  Patient defervesced and had  improvement in her cellulitis.  After four days of vancomycin and Unasyn,  she was changed to p.o. Keflex. She continues to remain without fever, with  very slow but improvement in her left lower extremity cellulitis.  She will  need close follow-up with her primary care physician and will have her  complete 14 days of antibiotics.   PROBLEM #2 -  PULMONARY:  Patient presented with respiratory insufficiency  and was felt to be secondary to acute exacerbation of COPD.  CT of the chest  was obtained and was unremarkable.  Later in her hospitalization, she did  develop some mild volume overload which was corrected with one dose of  Lasix.  Currently, her respiratory insufficiency has resolved.   PROBLEM #3 -  CARDIOVASCULAR:  The patient presented with tachycardia.  This  is likely secondary to sepsis, fever and dehydration.  This has corrected.  Patient's TSH was normal.   PROBLEM #4 -  HYPONATREMIA AND HYPOKALEMIA:  Also secondary to dehydration.  This was corrected.   PROBLEM #5 -  HYPERGLYCEMIA:  Likely related to infection.  Her hemoglobin  A1c was normal and we do not feel she has any glucose intolerance.   DISCHARGE LABORATORY DATA:  Stool for C. difficile toxin was negative.  Potassium 3.4, BUN 7, creatinine 0.7. LFTs were normal.  Hemoglobin A1c was  5.5.  Hemoglobin 10.3.  TSH 3.635.  Blood cultures negative x2.   DISCHARGE MEDICATIONS:  1.  Keflex 500 mg t.i.d. last dose on May 01, 2004.  2.  Protonix 40 mg daily.  3.  Diovan/HCT 160/25 daily.  4.  Synthroid 0.112 mg daily.  5.  Atenolol 25 mg daily.  6.  Plavix 75 mg daily.  7.  Zoloft 75 mg daily.  8.  Neurontin 600 mg b.i.d.  9.  Betoptic  0.25 mg b.i.d.  10. Niaspan 500  mg two tablets daily.  11. Detrol 4 mg daily.   FOLLOW UP:  Dr. Laury Axon Monday, April 28, 2004, at 9 a.m.      LC/MEDQ  D:  04/23/2004  T:  04/23/2004  Job:  [id number]   cc:   Lelon Perla, M.D.

## 2010-07-18 NOTE — Discharge Summary (Signed)
**Note Kirsten Flores** [name], NEU               ACCOUNT NO.:  [id number]   MEDICAL RECORD NO.:  [id number]          PATIENT TYPE:  INP   LOCATION:  6729                         FACILITY:  [facility]   PHYSICIAN:  Valerie A. Felicity Coyer, MDDATE OF BIRTH:  25-Sep-1923   DATE OF ADMISSION:  06/01/2006  DATE OF DISCHARGE:  06/04/2006                               DISCHARGE SUMMARY   DISCHARGE DIAGNOSES:  1. Status post accidental fall in setting of alcohol intoxication.  2. Rhabdomyolysis secondary to fall.  3. Mild hyponatremia on admission.  4. Hypertension.   HISTORY OF PRESENT ILLNESS:  [name] is an 77 year old female who  was admitted on 06/01/2006 status post accidental fall, while taking out  the trash.  The patient reported slipping and knocking off her glasses  and hitting her face, while she was taking of trash.  She was a unable  to get back up, and this apparently occurred the evening between 9:30  and 10:00 p.m., on the evening prior to admission.  She managed to scoot  herself on her back, up to driveway and into the garage.  She reports  calling out for help; however, due to late hours or patient being hard  of hearing, she was unable to get help.  Her husband did find her, when  she returned to the garage.  She reported three drinks on the evening  prior to the fall and was noted to have an elevated alcohol level on  admission.  She suffered a laceration to her right forearm which was  sutured, as well as to the bridge of her nose which was also sutured on  admission.  The patient was admitted for further evaluation and  treatment.   PAST MEDICAL HISTORY:  1. COPD.  2. Right lower extremity cellulitis.  3. Hypertension.  4. Dyslipidemia.  5. Hyponatremia.  6. Left parietal CVA.  7. Degenerative disk disease.  8. Vertigo.  9. Hypothyroid.  10.GERD.  11.Depression.  12.Lipoma.  13.Shingles January 2008.  14.Cataracts.  15.Lumbar stenosis/radiculopathy.  16.Peripheral  neuropathy.   COURSE OF HOSPITALIZATION:  PROBLEM:  1. Status post accidental fall in setting of alcohol intoxication.      The patient was admitted.  She underwent CT of head on admission,      which showed no acute findings, it did note some atrophy and      chronic microvascular disease.  A CT of the C-spine was performed      which showed no fracture.  A maxillofacial CT was performed, which      did note a nasal septal fracture.  Chest x-ray performed on      admission noted mild subsegmental atelectasis.  Pelvic, hip, knee,      elbow and thoracic/lumbar spine x-rays were negative for acute      fracture.  The patient was evaluated by physical therapy and      occupational therapy during this admission, who both recommended      home health upon discharge.   The patient is instructed to use her walker at home.  We will ask home  health PT and OT to evaluate the patient at home.  We will also ask a  home health RN to remove her sutures on June 08, 2006.  The patient is  instructed to discontinue use of alcohol.  She will be given a limited  prescription for Vicodin 20 tablets, which had been called into the  patient's pharmacy prior to discharge.   MEDICATIONS AT TIME OF DISCHARGE:  1. Protonix 40 mg p.o. daily.  2. Synthroid 112 mcg p.o. daily.  3. Plavix 75 mg p.o. daily.  4. Sertraline 75 mg p.o. daily.  5. Niacin 1,000 mg p.o. daily in the evening, to be held until follow      up with Dr. Laury Axon, due to elevated LFTs.  6. Detrol 4 mg p.o. daily.  7. Lasix 40 mg p.o. daily.  8. Atenolol 25 mg p.o. daily.  9. Gabapentin 600 p.o. b.i.d.  10.Diovan HCT 160/25/1 tablet p.o. daily.  11.Fosamax Plus D once weekly as before.  12.Potassium chloride 20 mEq p.o. daily.  13.Betoptic 0.25% ophthalmic drops, one drop to each eye twice daily.  14.Vicodin 5/500, 1 tablet p.o. q.6 h p.r.n. pain.  Prescription      called in for 20 tablets.   PERTINENT LABORATORY DATA:  At time of  discharge, BUN 4, creatinine  0.43, potassium 3.4 prior to p.o. repletion, hemoglobin 13.1, hematocrit  39.   DISPOSITION:  The patient will be discharged to home with home health  PT, OT and home health RN for suture removal.   FOLLOW UP:  The patient is instructed to follow up with Dr. Loreen Freud  on Thursday, April 10 at 10:45 a.m.      Sandford Craze, NP      Raenette Rover. Felicity Coyer, MD  Electronically Signed    MO/MEDQ  D:  06/04/2006  T:  06/04/2006  Job:  [id number]   cc:   Lelon Perla, DO

## 2010-07-18 NOTE — Procedures (Signed)
[place]. [facility]  Patient:    Kirsten Flores, Kirsten Flores Visit Number: [id number] MRN: [id number]          Service Type: END Location: ENDO Attending Physician:  Orland Mustard Dictated by:   Llana Aliment. Randa Evens, M.D. Proc. Date: 07/01/01 Admit Date:  07/01/2001 Discharge Date: 07/01/2001   CC:         Dellis Anes. Idell Pickles, M.D.   Procedure Report  PROCEDURE PERFORMED:  Colonoscopy and polypectomy.  ENDOSCOPIST:  Llana Aliment. Randa Evens, M.D.  MEDICATIONS USED:  Fentanyl 60 mcg, Versed 6 mg IV.  INSTRUMENT:  Pediatric Olympus video colonoscope.  INDICATIONS:  Previous history of adenomatous polyps.  DESCRIPTION OF PROCEDURE:  The procedure had been explained to the patient and consent obtained.  With the patient in the left lateral decubitus position, the Olympus pediatric video colonoscope was inserted and advanced under direct visualization.  The prep was excellent and we were able to advance to the cecum.  She had extensive diverticular disease.  Once we were past the area of diverticulosis in the sigmoid colon, we were able to advance on to the cecum. The ileocecal valve and appendiceal orifice were seen.  The scope was withdrawn.  The cecum, ascending colon, hepatic flexure, transverse colon, splenic flexure, descending and sigmoid colon were seen well. A 0.5 cm polyp was removed with a snare in the descending colon.  Extensive diverticular disease in the sigmoid colon but no further polyps.  Scope withdrawn, patient tolerated the procedure well.  ASSESSMENT: 1. Descending colon polyp removed. 2. Marked diverticular disease.  PLAN:  Routine postpolypectomy instructions.  Will recommend repeating in three years. Dictated by:   Llana Aliment. Randa Evens, M.D. Attending Physician:  Orland Mustard DD:  07/01/01 TD:  07/04/01 Job: [id number]

## 2010-07-18 NOTE — Discharge Summary (Signed)
[place]. [facility]  Patient:    Kirsten Flores, Kirsten Flores                     MRN: [id number] Adm. Date:  03/07/99 Disc. Date: 03/10/99 Attending:  Anastasio Auerbach, M.D. CC:         Dellis Anes. Idell Pickles, M.D.             Colleen Can. Deborah Chalk, M.D.             Nadara Mustard, M.D.             Westly Pam. Iran Planas, M.D.                           Discharge Summary  DATE OF BIRTH:  01-Mar-1924  DISCHARGE DIAGNOSES:  1. Acute left parietal ischemic cerebrovascular accident.     A. Right hand weakness, completely resolved.     B. Documented by magnetic resonance imaging.     C. Carotid Dopplers: no significant stenosis.     D. Two-dimensional echocardiogram (January 5):  No embolic source.  2. Normocytic anemia.     A. Admission hemoglobin 12.1, MCV 91, discharge hemoglobin 10.4.     B. Guaiac negative on admission.  3. Severe degenerative disk disease.  4. Hypertension.  5. Probable chronic obstructive pulmonary disease.     A. There is an 80-100 pack-year history.     B. Quit greater than 20 years ago.  6. History of vertigo.  7. History of palpitations.     A. Holter monitor, 1998:  PACs.     B. Symptoms resolved on beta blocker.  8. Abnormal electrocardiogram.     A. Partial right bundle branch block initially this admission, resolved.     B. Low voltage QRS.  9. Hypothyroidism.     A. TSH March 07, 1999:  4.096. 10. Postmenopausal. 11. History of glaucoma and cataracts. 12. Status post lower extremity vein stripping x 2, remote. 13. Total abdominal hysterectomy. 14. History of colon polyps. 15. Status post hemorrhoidectomy. 16. ALLERGIES: CARDIZEM, NEOMYCIN EYE DROPS.      DISCHARGE MEDICATIONS:  1. (new) Plavix 75 mg q.d.  2. (new) Protonix 40 mg q.d. x 2 weeks (further continuation per Dr. Idell Pickles).  HOME MEDICATIONS AS BEFORE:  1. Premarin 0.625 mg q.d.  2. Synthroid 112 mcg q.d.  3. Hyzaar 100/25 mg q.d.  4. Vioxx 25 mg q.d.  5. Atenolol 25 mg  q.h.s.  6. Aspirin 325 mg q.d.  7. Betoptic 1 drop b.i.d.  8. Ultram 50 mg p.r.n. pain.  CONDITION UPON DISCHARGE:  Stable.  The patient had full use of the right hand.  RECOMMENDED ACTIVITY:  As tolerated.  RECOMMENDED DIET:  As tolerated.  SPECIAL INSTRUCTIONS:  The patient is to contact Dr. Idell Pickles immediately if she notices any blood in her stools or black, tarry stools.  She is to return immediately if she has more neurologic symptoms.  FOLLOWUP:  The patient has an appointment to see Dr. Foye Deer on Friday, January 12, at 11 oclock.  At that visit she will need her hemoglobin checked and she will need to be given home stool guaiac cards to check.  This will determine the need to continue Prevacid.  Also decide whether or not the patient should be on Vioxx when she is on both Plavix and aspirin.  CONSULTANTS:  Westly Pam. Iran Planas, M.D.  PROCEDURES:  1.  Head CT (January 5):  No acute disease.  2. Chest x-ray (January 5):  Normal.  3. MRI (January 5):  Acute left parietal ischemia in the distribution of the left     precentral cortex corresponding with right hand numbness.  Negative MR     angiography of the intracranial circulation.  4. Carotid Dopplers (results above).  5. A 2-D echo (January 5):  No intracardiac source of emboli.  Official report  concerning valves pending.  Grossly normal on LV function.  HOSPITAL COURSE: #1 - ACUTE ISCHEMIC STROKE:  This 77 year old Caucasian woman with a history of  hypertension and tobacco use awoke at 5 a.m. to go to the bathroom.  Upon awakening she realized that she had difficulty pulling the covers off with her right hand. She saw her primary care physician in the clinic later in the day and noted that her forearm was starting to feel weak.  She was referred to the emergency room.  Initial head CT showed no evidence of bleeding.  She was given aspirin (which she takes at home) and IV heparin was started per stroke  protocol.  Dr. Iran Planas was consulted.  MRI was done that same day and demonstrated an acute ischemic stroke in the distribution of the left precentral cortex.  This corresponded with the patients symptoms.  Throughout the day of admission her symptoms gradually improved.  We did a 2-D echo which showed no source of intracardiac emboli. Carotid Dopplers showed no evidence of stenosis.  With these results, we opted o stop the heparin and place the patient on Plavix in addition to aspirin.  It will be important to control her blood pressure.  The MRI also demonstrated increased signal in the periventricular and subcortical white matter, consistent with small-vessel disease.  They felt these changes were relatively mild.  She will remain on Plavix indefinitely.  We also did a lipid panel this admission to assess her risk factor from that aspect.  Her total cholesterol was 140, triglyceride 50, HDL 51, LDL 59.  We encouraged her to continue a low fat diet.  She will return if any symptoms recur.  #2 - ANEMIA:  On admission, [name] had hemoglobin of 12.1.  She was guaiac negative.  After being started on blood thinners we did note that her hemoglobin dropped to 10.4 and remained stable upon discharge.  She had no evidence of bleeding.  The etiology of this drop is unclear.  It is important to note that he patient is on Vioxx for pain, and although it is supposed to be less toxic to the gastric mucosa, there certainly is concern.  She will go home on Plavix and aspirin.  We will continue the Vioxx.  I also opted to put her on Protonix as an antacid.  She should have her hemoglobin rechecked in followup and guaiac cards  used at home.  I will leave the decision as to whether or not to stop Vioxx or o continue Protonix up to Dr. Idell Pickles.  The patient does have a history of colon polyps and undergoes colonoscopy regularly. DD:  03/13/99 TD:  03/13/99 Job: [id number]

## 2010-07-18 NOTE — Op Note (Signed)
Kirsten Flores, [name]               ACCOUNT NO.:  [id number]   MEDICAL RECORD NO.:  [id number]          PATIENT TYPE:  AMB   LOCATION:  ENDO                         FACILITY:  [facility]   PHYSICIAN:  James L. Malon Kindle., M.D.DATE OF BIRTH:  February 07, 1924   DATE OF PROCEDURE:  03/11/2005  DATE OF DISCHARGE:                                 OPERATIVE REPORT   PROCEDURE:  Colonoscopy and polypectomy.   MEDICATIONS GIVEN:  Fentanyl 100 mcg, Versed 10 mg IV.   INDICATIONS FOR PROCEDURE:  Previous history of colon polyps, this is done  as a follow up.   DESCRIPTION OF PROCEDURE:  The procedure was explained and patient consent  obtained.  With the patient in the left lateral decubitus position, the  Olympus pediatric scope was inserted.  The prep was adequate, some sticky  adherent stool, particularly in the right colon.  Cecum reached, ileocecal  valve and appendiceal orifice seen.  The scope was withdrawn.  The cecum,  ascending colon, transverse colon, descending and sigmoid colon were seen  well.  No polyps seen.  The scope was withdrawn in the rectum.  A 0.5 cm  polyp was removed with a snare and suctioned through the scope.  No other  polyps seen.  The scope withdrawn.  The patient tolerated the procedure  well.   ASSESSMENT:  Rectal polyp removed.   PLAN:  Will check pathology and recommend repeating in the future for  specific indications due to her age such as heme positive stool, anemia,  etc.           ______________________________  Llana Aliment. Malon Kindle., M.D.     Waldron Session  D:  03/11/2005  T:  03/11/2005  Job:  [id number]   cc:   Loreen Freud, M.D.  [place] [place]  [place] [address]

## 2010-08-25 ENCOUNTER — Other Ambulatory Visit: Payer: Self-pay | Admitting: Family Medicine

## 2010-08-25 NOTE — Telephone Encounter (Signed)
Per last ov pt is due for CPX. Sent refill.

## 2010-08-25 NOTE — Telephone Encounter (Signed)
**  Addendum, per last ov in [facility] pt is due for CPX. Per last ov in Epic pt is to follow up in 3 months. Please advise which appt you want the pt to have.

## 2010-08-25 NOTE — Telephone Encounter (Signed)
Next appointment can be cpe 3 months from last ov

## 2010-08-26 NOTE — Telephone Encounter (Signed)
If I told her six months because of all the other drs she sees than you can leave it.

## 2010-08-26 NOTE — Telephone Encounter (Signed)
Can you schedule this patient a CPE in August please    KP

## 2010-08-26 NOTE — Telephone Encounter (Signed)
Pt currently has an appt that is 5 months from time of last visit. Would you like for me to call and reschedule that visit?

## 2010-08-26 NOTE — Telephone Encounter (Signed)
Disregard last msg--- make appointment 3 months

## 2010-08-27 NOTE — Telephone Encounter (Signed)
lmon for patient to call & schedule cpx in august

## 2010-10-14 ENCOUNTER — Ambulatory Visit (INDEPENDENT_AMBULATORY_CARE_PROVIDER_SITE_OTHER): Payer: Medicare Other | Admitting: Family Medicine

## 2010-10-14 ENCOUNTER — Encounter: Payer: Self-pay | Admitting: Family Medicine

## 2010-10-14 VITALS — BP 140/62 | HR 60 | Temp 98.6°F | Resp 22 | Ht 62.5 in | Wt 225.0 lb

## 2010-10-14 DIAGNOSIS — Z0000 Encounter for general adult medical examination without abnormal findings: Secondary | ICD-10-CM

## 2010-10-14 DIAGNOSIS — I10 Essential (primary) hypertension: Secondary | ICD-10-CM

## 2010-10-14 DIAGNOSIS — D229 Melanocytic nevi, unspecified: Secondary | ICD-10-CM

## 2010-10-14 DIAGNOSIS — D492 Neoplasm of unspecified behavior of bone, soft tissue, and skin: Secondary | ICD-10-CM

## 2010-10-14 DIAGNOSIS — E039 Hypothyroidism, unspecified: Secondary | ICD-10-CM

## 2010-10-14 DIAGNOSIS — R52 Pain, unspecified: Secondary | ICD-10-CM

## 2010-10-14 DIAGNOSIS — Z136 Encounter for screening for cardiovascular disorders: Secondary | ICD-10-CM

## 2010-10-14 DIAGNOSIS — E785 Hyperlipidemia, unspecified: Secondary | ICD-10-CM

## 2010-10-14 LAB — POCT URINALYSIS DIPSTICK
Ketones, UA: NEGATIVE
Leukocytes, UA: NEGATIVE
Nitrite, UA: NEGATIVE
Protein, UA: NEGATIVE
Urobilinogen, UA: 0.2

## 2010-10-14 LAB — BASIC METABOLIC PANEL
Calcium: 9.4 mg/dL (ref 8.4–10.5)
GFR: 63.17 mL/min (ref >60.00)
Sodium: 140 mEq/L (ref 135–145)

## 2010-10-14 LAB — HEPATIC FUNCTION PANEL
AST: 27 U/L (ref 0–37)
Albumin: 4.5 g/dL (ref 3.5–5.2)
Alkaline Phosphatase: 30 U/L — ABNORMAL LOW (ref 39–117)
Bilirubin, Direct: 0 mg/dL (ref 0.0–0.3)

## 2010-10-14 LAB — CBC WITH DIFFERENTIAL/PLATELET
Basophils Absolute: 0 10*3/uL (ref 0.0–0.1)
Basophils Relative: 0.4 % (ref 0.0–3.0)
Hemoglobin: 11.8 g/dL — ABNORMAL LOW (ref 12.0–15.0)
Lymphocytes Relative: 23.9 % (ref 12.0–46.0)
Monocytes Relative: 8 % (ref 3.0–12.0)
Neutro Abs: 3.9 10*3/uL (ref 1.4–7.7)
RBC: 3.48 Mil/uL — ABNORMAL LOW (ref 3.87–5.11)
RDW: 13.5 % (ref 11.5–14.6)

## 2010-10-14 LAB — LIPID PANEL
Total CHOL/HDL Ratio: 3
VLDL: 31.2 mg/dL (ref 0.0–40.0)

## 2010-10-14 MED ORDER — TRAMADOL HCL 50 MG PO TABS: 50.0000 mg | ORAL_TABLET | Freq: Four times a day (QID) | ORAL | Status: DC | PRN

## 2010-10-14 MED ORDER — ZOSTER VACCINE LIVE 19400 UNT/0.65ML ~~LOC~~ SOLR: 0.6500 mL | Freq: Once | SUBCUTANEOUS | Status: DC

## 2010-10-14 NOTE — Progress Notes (Signed)
Subjective:    Kirsten Flores is a 77 y.o. female who presents for Medicare Annual/Subsequent preventive examination.  Preventive Screening-Counseling & Management  Tobacco History  Smoking status  . Never Smoker   Smokeless tobacco  . Never Used     Problems Prior to Visit 1.   Current Problems (verified) Patient Active Problem List  Diagnoses  . HYPOTHYROIDISM  . B12 DEFICIENCY  . HYPERLIPIDEMIA  . HYPERTENSION  . URI  . UTI  . KNEE PAIN, RIGHT  . OSTEOPENIA  . MEMORY LOSS  . GAIT DISTURBANCE  . NUMBNESS, HAND  . LACERATION, SCALP  . Bronchitis    Medications Prior to Visit Current Outpatient Prescriptions on File Prior to Visit  Medication Sig Dispense Refill  . alendronate (FOSAMAX) 70 MG tablet Take 70 mg by mouth every 7 (seven) days. Take with a full glass of water on an empty stomach.       Marland Kitchen atenolol (TENORMIN) 25 MG tablet Take 25 mg by mouth daily.        Marland Kitchen atorvastatin (LIPITOR) 10 MG tablet Take 10 mg by mouth daily.        . betaxolol (BETOPTIC-S) 0.25 % ophthalmic suspension Place 1 drop into both eyes 2 (two) times daily.        . clopidogrel (PLAVIX) 75 MG tablet Take 75 mg by mouth daily.        . ferrous sulfate 325 (65 FE) MG tablet Take 325 mg by mouth daily with breakfast.        . gabapentin (NEURONTIN) 600 MG tablet Take 600 mg by mouth at bedtime.        Marland Kitchen HYDROcodone-acetaminophen (VICODIN ES) 7.5-750 MG per tablet Take 1 tablet by mouth every 6 (six) hours as needed.        Marland Kitchen levothyroxine (SYNTHROID, LEVOTHROID) 112 MCG tablet Take 112 mcg by mouth daily.        . Multiple Vitamin (MULTIVITAMIN) tablet Take 1 tablet by mouth daily.        . pantoprazole (PROTONIX) 40 MG tablet Take 40 mg by mouth daily.        . potassium chloride SA (K-DUR,KLOR-CON) 20 MEQ tablet TAKE 1 TABLET DAILY  90 tablet  0  . prednisoLONE (ORAPRED ODT) 10 MG disintegrating tablet 3 po qd for 3 days then 2 po qd for 3 days then 1 po qd for 3 days  18 tablet  0    . sertraline (ZOLOFT) 50 MG tablet Take 50 mg by mouth daily.        Marland Kitchen tolterodine (DETROL LA) 4 MG 24 hr capsule Take 4 mg by mouth daily.        . traMADol (ULTRAM) 50 MG tablet Take 1 tablet (50 mg total) by mouth every 6 (six) hours as needed.  30 tablet  0  . TRICOR 145 MG tablet TAKE 1 TABLET DAILY  90 tablet  1  . valsartan-hydrochlorothiazide (DIOVAN-HCT) 160-25 MG per tablet Take 1 tablet by mouth daily.          Current Medications (verified) Current Outpatient Prescriptions  Medication Sig Dispense Refill  . alendronate (FOSAMAX) 70 MG tablet Take 70 mg by mouth every 7 (seven) days. Take with a full glass of water on an empty stomach.       Marland Kitchen atenolol (TENORMIN) 25 MG tablet Take 25 mg by mouth daily.        Marland Kitchen atorvastatin (LIPITOR) 10 MG tablet Take 10 mg by mouth  daily.        . betaxolol (BETOPTIC-S) 0.25 % ophthalmic suspension Place 1 drop into both eyes 2 (two) times daily.        . clopidogrel (PLAVIX) 75 MG tablet Take 75 mg by mouth daily.        Marland Kitchen ezetimibe-simvastatin (VYTORIN) 10-20 MG per tablet Take 1 tablet by mouth at bedtime.        . ferrous sulfate 325 (65 FE) MG tablet Take 325 mg by mouth daily with breakfast.        . gabapentin (NEURONTIN) 600 MG tablet Take 600 mg by mouth at bedtime.        Marland Kitchen HYDROcodone-acetaminophen (VICODIN ES) 7.5-750 MG per tablet Take 1 tablet by mouth every 6 (six) hours as needed.        Marland Kitchen levothyroxine (SYNTHROID, LEVOTHROID) 112 MCG tablet Take 112 mcg by mouth daily.        . Multiple Vitamin (MULTIVITAMIN) tablet Take 1 tablet by mouth daily.        . pantoprazole (PROTONIX) 40 MG tablet Take 40 mg by mouth daily.        . potassium chloride SA (K-DUR,KLOR-CON) 20 MEQ tablet TAKE 1 TABLET DAILY  90 tablet  0  . prednisoLONE (ORAPRED ODT) 10 MG disintegrating tablet 3 po qd for 3 days then 2 po qd for 3 days then 1 po qd for 3 days  18 tablet  0  . sertraline (ZOLOFT) 50 MG tablet Take 50 mg by mouth daily.        Marland Kitchen  tolterodine (DETROL LA) 4 MG 24 hr capsule Take 4 mg by mouth daily.        . traMADol (ULTRAM) 50 MG tablet Take 1 tablet (50 mg total) by mouth every 6 (six) hours as needed.  30 tablet  0  . TRICOR 145 MG tablet TAKE 1 TABLET DAILY  90 tablet  1  . valsartan-hydrochlorothiazide (DIOVAN-HCT) 160-25 MG per tablet Take 1 tablet by mouth daily.           Allergies (verified) Diltiazem hcl and Neomycin   PAST HISTORY  Family History Family History  Problem Relation Age of Onset  . Colon cancer    . Melanoma      Social History History  Substance Use Topics  . Smoking status: Never Smoker   . Smokeless tobacco: Never Used  . Alcohol Use: No     Are there smokers in your home (other than you)? No  Risk Factors Current exercise habits: The patient does not participate in regular exercise at present.  Dietary issues discussed: none   Cardiac risk factors: advanced age (older than 46 for men, 20 for women), dyslipidemia, hypertension, obesity (BMI >= 30 kg/m2) and sedentary lifestyle.  Depression Screen (Note: if answer to either of the following is "Yes", a more complete depression screening is indicated)   Over the past two weeks, have you felt down, depressed or hopeless? No  Over the past two weeks, have you felt little interest or pleasure in doing things? No  Have you lost interest or pleasure in daily life? No  Do you often feel hopeless? No  Do you cry easily over simple problems? No  Activities of Daily Living In your present state of health, do you have any difficulty performing the following activities?:  Driving? Yes--her son drives her everywhere Managing money?  No---her son helps Feeding yourself? no Getting from bed to chair? no Climbing a flight  of stairs? Yes Preparing food and eating?: No Bathing or showering? No Getting dressed: No Getting to the toilet? No Using the toilet:No Moving around from place to place: No In the past year have you fallen or  had a near fall?:No   Are you sexually active?  No  Do you have more than one partner?  No  Hearing Difficulties: No Do you often ask people to speak up or repeat themselves? No Do you experience ringing or noises in your ears? No Do you have difficulty understanding soft or whispered voices? No   Do you feel that you have a problem with memory? No  Do you often misplace items? No  Do you feel safe at home?  Yes  Cognitive Testing  Alert? Yes  Normal Appearance?Yes  Oriented to person? Yes  Place? Yes   Time? Yes  Recall of three objects?  Yes  Can perform simple calculations? Yes  Displays appropriate judgment?Yes  Can read the correct time from a watch face?Yes   Advanced Directives have been discussed with the patient? Yes  List the Names of Other Physician/Practitioners you currently use: 1.    Indicate any recent Medical Services you may have received from other than [facility] providers in the past year (date may be approximate).  Immunization History  Administered Date(s) Administered  . Influenza Whole 12/28/2006, 12/30/2007, 01/03/2009, 12/17/2009  . Pneumococcal Polysaccharide 03/02/2001    Screening Tests Health Maintenance  Topic Date Due  . Tetanus/tdap  04/03/1944  . Zostavax  04/03/1985  . Influenza Vaccine  12/01/2010  . Colonoscopy  10/10/2012  . Pneumococcal Polysaccharide Vaccine Age 5 And Over  Completed    All answers were reviewed with the patient and necessary referrals were made:  Loreen Freud, DO   10/14/2010   History reviewed: all, soc hx, pmh , fam hx, meds  Review of Systems  Review of Systems  Constitutional: Negative for activity change, appetite change and fatigue.  HENT: Negative for hearing loss, congestion, tinnitus and ear discharge.   Eyes: Negative for visual disturbance (see optho q1y -- vision corrected to 20/20 with glasses).  Respiratory: Negative for cough, chest tightness and shortness of breath.   Cardiovascular: Negative for  chest pain, palpitations and leg swelling.  Gastrointestinal: Negative for abdominal pain, diarrhea, constipation and abdominal distention.  Genitourinary: Negative for urgency, frequency, decreased urine volume and difficulty urinating.  Musculoskeletal: Negative for back pain, arthralgias and gait problem.  Skin: Negative for color change, pallor and rash.  Neurological: Negative for dizziness, light-headedness, numbness and headaches.  Hematological: Negative for adenopathy. Does not bruise/bleed easily.  Psychiatric/Behavioral: Negative for suicidal ideas, confusion, sleep disturbance, self-injury, dysphoric mood, decreased concentration and agitation.  Pt is able to read and write and can do all ADLs No risk for falling No abuse/ violence in home      Objective:     Vision-- ophtho   Body mass index is 40.50 kg/(m^2). BP 140/62  Pulse 60  Temp(Src) 98.6 F (37 C) (Oral)  Resp 22  Ht 5' 2.5" (1.588 m)  Wt 225 lb (102.059 kg)  BMI 40.50 kg/m2  SpO2 91%.  BP 140/62  Pulse 60  Temp(Src) 98.6 F (37 C) (Oral)  Resp 22  Ht 5' 2.5" (1.588 m)  Wt 225 lb (102.059 kg)  BMI 40.50 kg/m2  SpO2 91%  General Appearance:    Alert, cooperative, no distress, appears stated age  Head:    Normocephalic, without obvious abnormality, atraumatic  Eyes:  PERRL, conjunctiva/corneas clear, EOM's intact,    , both eyes  Ears:    Normal TM's and external ear canals, both ears  Nose:   Nares normal, septum midline, mucosa normal, no drainage    or sinus tenderness  Throat:   Lips, mucosa, and tongue normal; teeth and gums normal  Neck:   Supple, symmetrical, trachea midline, no adenopathy;    thyroid:  no enlargement/tenderness/nodules; no carotid   bruit or JVD  Back:   Not examined  Lungs:     Clear to auscultation bilaterally, respirations unlabored  Chest Wall:    No tenderness or deformity   Heart:    Regular rate and rhythm, S1 and S2 normal, no murmur  Breast Exam:    Pt  refused  Abdomen:     Soft, non-tender, bowel sounds active all four quadrants,    no masses, no organomegaly  Genitalia:    deferred  Rectal:    Deferred per pt request  Extremities:   Extremities normal, atraumatic, no cyanosis or edema  Pulses:   2+ and symmetric all extremities  Skin:   Skin color, texture, turgor normal, no rashes + sore on forehead-- not healing  Lymph nodes:   Cervical, supraclavicular, and axillary nodes normal  Neurologic:   CNII-XII intact,, sensation and reflexes    Throughout--- pt walks with walker     Assessment:     Annual exam  HTN---stable,  con't meds Hypothyroidism-- con't med, check lab Hyperlipidemia-- con't meds , check labs osteopenia   Plan:     During the course of the visit the patient was educated and counseled about appropriate screening and preventive services including:    Screening mammography  Bone densitometry screening  Colorectal cancer screening  Nutrition counseling   Diet review for nutrition referral? Yes ____  Not Indicated _x___   Patient Instructions (the written plan) was given to the patient.  Medicare Attestation I have personally reviewed: The patient's medical and social history Their use of alcohol, tobacco or illicit drugs Their current medications and supplements The patient's functional ability including ADLs,fall risks, home safety risks, cognitive, and hearing and visual impairment Diet and physical activities Evidence for depression or mood disorders  The patient's weight, height, and BMI have been recorded in the chart.  I have made referrals, counseling, and provided education to the patient based on review of the above and I have provided the patient with a written personalized care plan for preventive services.     Loreen Freud, DO   10/14/2010

## 2010-10-14 NOTE — Patient Instructions (Signed)

## 2010-10-15 ENCOUNTER — Encounter: Payer: Self-pay | Admitting: Family Medicine

## 2010-11-21 ENCOUNTER — Other Ambulatory Visit: Payer: Self-pay | Admitting: Family Medicine

## 2010-11-21 MED ORDER — POTASSIUM CHLORIDE CRYS ER 20 MEQ PO TBCR: 20.0000 meq | EXTENDED_RELEASE_TABLET | Freq: Every day | ORAL | Status: DC

## 2010-11-21 NOTE — Telephone Encounter (Signed)
Refill for potassium chloride - [facility] 90 day supply

## 2010-12-01 ENCOUNTER — Other Ambulatory Visit: Payer: Self-pay | Admitting: Dermatology

## 2010-12-02 ENCOUNTER — Encounter: Payer: Self-pay | Admitting: Family Medicine

## 2010-12-02 ENCOUNTER — Ambulatory Visit (INDEPENDENT_AMBULATORY_CARE_PROVIDER_SITE_OTHER): Payer: Medicare Other | Admitting: Family Medicine

## 2010-12-02 VITALS — BP 118/66 | HR 79 | Temp 98.7°F | Wt 223.0 lb

## 2010-12-02 DIAGNOSIS — Z23 Encounter for immunization: Secondary | ICD-10-CM

## 2010-12-02 NOTE — Progress Notes (Signed)
  Subjective:    Patient ID: Kirsten Flores, female    DOB: 12/01/1923, 77 y.o.   MRN: [id number]  HPI Pt was here for flu shot.  Too early for labs.  F/u do in Feb.  Pt saw Derm and bx of lesion on face was done.     Review of Systems No complaints.    Objective:   Physical Exam  Constitutional: She appears well-developed and well-nourished.  Psychiatric: She has a normal mood and affect. Her behavior is normal.          Assessment & Plan:  Flu shot given  rto February or sooner prn

## 2010-12-02 NOTE — Patient Instructions (Signed)
Your flu shot was given today rto February for labs and follow up.

## 2011-01-16 ENCOUNTER — Other Ambulatory Visit: Payer: Self-pay | Admitting: Family Medicine

## 2011-01-24 ENCOUNTER — Other Ambulatory Visit: Payer: Self-pay | Admitting: Family Medicine

## 2011-02-02 ENCOUNTER — Other Ambulatory Visit: Payer: Self-pay | Admitting: Family Medicine

## 2011-04-09 ENCOUNTER — Encounter: Payer: Self-pay | Admitting: Family Medicine

## 2011-04-09 ENCOUNTER — Ambulatory Visit (INDEPENDENT_AMBULATORY_CARE_PROVIDER_SITE_OTHER): Payer: Medicare Other | Admitting: Family Medicine

## 2011-04-09 VITALS — BP 138/64 | HR 96 | Temp 98.0°F | Wt 228.2 lb

## 2011-04-09 DIAGNOSIS — D229 Melanocytic nevi, unspecified: Secondary | ICD-10-CM

## 2011-04-09 DIAGNOSIS — E039 Hypothyroidism, unspecified: Secondary | ICD-10-CM

## 2011-04-09 DIAGNOSIS — E785 Hyperlipidemia, unspecified: Secondary | ICD-10-CM

## 2011-04-09 DIAGNOSIS — I10 Essential (primary) hypertension: Secondary | ICD-10-CM

## 2011-04-09 DIAGNOSIS — R52 Pain, unspecified: Secondary | ICD-10-CM

## 2011-04-09 DIAGNOSIS — Z0000 Encounter for general adult medical examination without abnormal findings: Secondary | ICD-10-CM

## 2011-04-09 LAB — TSH: TSH: 5.27 u[IU]/mL (ref 0.35–5.50)

## 2011-04-09 LAB — HEPATIC FUNCTION PANEL
AST: 23 U/L (ref 0–37)
Albumin: 3.9 g/dL (ref 3.5–5.2)
Alkaline Phosphatase: 37 U/L — ABNORMAL LOW (ref 39–117)
Total Protein: 6.5 g/dL (ref 6.0–8.3)

## 2011-04-09 LAB — BASIC METABOLIC PANEL
BUN: 13 mg/dL (ref 6–23)
CO2: 31 mEq/L (ref 19–32)
Calcium: 9.4 mg/dL (ref 8.4–10.5)
GFR: 60.01 mL/min (ref >60.00)
Glucose, Bld: 88 mg/dL (ref 70–99)
Potassium: 4.2 mEq/L (ref 3.5–5.1)
Sodium: 138 mEq/L (ref 135–145)

## 2011-04-09 LAB — LIPID PANEL
Cholesterol: 127 mg/dL (ref 0–200)
VLDL: 26.2 mg/dL (ref 0.0–40.0)

## 2011-04-09 MED ORDER — TRAMADOL HCL 50 MG PO TABS: 50.0000 mg | ORAL_TABLET | Freq: Four times a day (QID) | ORAL | Status: DC | PRN

## 2011-04-09 NOTE — Progress Notes (Signed)
  Subjective:    Patient here for follow-up of elevated blood pressure.  She is not exercising and is adherent to a low-salt diet.  Blood pressure is well controlled at home. Cardiac symptoms: none. Patient denies: chest pain, chest pressure/discomfort, claudication, irregular heart beat, near-syncope, orthopnea, palpitations, paroxysmal nocturnal dyspnea, syncope and tachypnea. Cardiovascular risk factors: advanced age (older than 46 for men, 36 for women), dyslipidemia, hypertension, obesity (BMI >= 30 kg/m2) and sedentary lifestyle. Use of agents associated with hypertension: none. History of target organ damage: none.  The following portions of the patient's history were reviewed and updated as appropriate: allergies, current medications, past family history, past medical history, past social history, past surgical history and problem list.  Review of Systems Pertinent items are noted in HPI.     Objective:    BP 138/64  Pulse 96  Temp(Src) 98 F (36.7 C) (Oral)  Wt 228 lb 3.2 oz (103.511 kg)  SpO2 88% General appearance: alert, cooperative, appears stated age and no distress Neck: no adenopathy, no carotid bruit, no JVD, supple, symmetrical, trachea midline and thyroid not enlarged, symmetric, no tenderness/mass/nodules Lungs: clear to auscultation bilaterally Heart: S1, S2 normal and + murmur Extremities: edema trace--compression stocking on    Assessment:    Hypertension, normal blood pressure . Evidence of target organ damage: none.   hyperlipidemia-- check labs, con't meds Hypothyroidism--- con't med Plan:    Medication: no change. Dietary sodium restriction. Check blood pressures 2-3 times weekly and record. Follow up: 6 months and as needed.

## 2011-04-09 NOTE — Patient Instructions (Signed)

## 2011-05-20 ENCOUNTER — Telehealth: Payer: Self-pay | Admitting: Family Medicine

## 2011-05-20 MED ORDER — LEVOTHYROXINE SODIUM 112 MCG PO TABS: 112.0000 ug | ORAL_TABLET | Freq: Every day | ORAL | Status: DC

## 2011-05-20 NOTE — Telephone Encounter (Signed)
Refill: Levothyroxine 112 mcg. 1 PO QD. 90 day supply.

## 2011-06-24 ENCOUNTER — Other Ambulatory Visit: Payer: Self-pay | Admitting: Family Medicine

## 2011-06-24 MED ORDER — SERTRALINE HCL 50 MG PO TABS: 50.0000 mg | ORAL_TABLET | Freq: Every day | ORAL | Status: DC

## 2011-06-24 NOTE — Telephone Encounter (Signed)
Refill Sertraline Tab 50MG   Qty 90  Last written 11.24.12 qty 90, take 1-tablet daily   Last OV 2.7.13

## 2011-07-23 ENCOUNTER — Other Ambulatory Visit: Payer: Self-pay | Admitting: Family Medicine

## 2011-07-23 MED ORDER — CLOPIDOGREL BISULFATE 75 MG PO TABS: ORAL_TABLET | ORAL | Status: DC

## 2011-07-23 NOTE — Telephone Encounter (Signed)
Rx sent 

## 2011-08-07 ENCOUNTER — Other Ambulatory Visit: Payer: Self-pay | Admitting: *Deleted

## 2011-08-07 MED ORDER — FENOFIBRATE 145 MG PO TABS: ORAL_TABLET | ORAL | Status: DC

## 2011-08-07 NOTE — Telephone Encounter (Signed)
Rx sent 

## 2011-08-14 ENCOUNTER — Telehealth: Payer: Self-pay | Admitting: Family Medicine

## 2011-08-14 MED ORDER — POTASSIUM CHLORIDE CRYS ER 20 MEQ PO TBCR: 20.0000 meq | EXTENDED_RELEASE_TABLET | Freq: Every day | ORAL | Status: DC

## 2011-08-14 NOTE — Telephone Encounter (Signed)
Refill: Potassium chloride crys ER tablet. 90 day supply

## 2011-08-18 ENCOUNTER — Telehealth: Payer: Self-pay | Admitting: Family Medicine

## 2011-08-18 MED ORDER — PANTOPRAZOLE SODIUM 40 MG PO TBEC: 40.0000 mg | DELAYED_RELEASE_TABLET | Freq: Every day | ORAL | Status: DC

## 2011-08-18 NOTE — Telephone Encounter (Signed)
Refill: Pantoprazole tab 40mg . Take 1 tablet daily. 90 day supply

## 2011-10-20 ENCOUNTER — Other Ambulatory Visit: Payer: Self-pay | Admitting: Family Medicine

## 2011-10-20 MED ORDER — FENOFIBRATE 145 MG PO TABS: ORAL_TABLET | ORAL | Status: DC

## 2011-10-20 MED ORDER — GABAPENTIN 600 MG PO TABS: 600.0000 mg | ORAL_TABLET | Freq: Every day | ORAL | Status: DC

## 2011-10-20 MED ORDER — ATORVASTATIN CALCIUM 10 MG PO TABS: 10.0000 mg | ORAL_TABLET | Freq: Every day | ORAL | Status: DC

## 2011-10-20 MED ORDER — TOLTERODINE TARTRATE ER 4 MG PO CP24: 4.0000 mg | ORAL_CAPSULE | Freq: Every day | ORAL | Status: DC

## 2011-10-20 MED ORDER — ATENOLOL 25 MG PO TABS: 25.0000 mg | ORAL_TABLET | Freq: Every day | ORAL | Status: DC

## 2011-10-20 NOTE — Telephone Encounter (Signed)
Refills x 5 last ov 2.7.13 follow up 23-months  1-DETROL LA 4 MG TAKE 1 CAPSULE DAILY AS NEEDED, requesting 90-day supply last wrt 11.16.12 #90 wt/2-refills  2-Atenolol (Tab) 25 MG TAKE 1 TABLET DAILY, requesting 90-supply, last wrt 12.3.12 90 wt/2-refills  3-Fenofibrate (Tab) 145 MG TAKE 1 TABLET DAILY, requesting 90-day supply, last wrt 6.7.13 #90 no refills  4-Gabapentin (Tab) 600 MG TAKE 1 TABLET AT BEDTIME, requesting 90-day supply, last wrt 12.3.12 #90 wt/2-refills  5-Atorvastatin Calcium (Tab) 10 MG TAKE 1 TABLET DAILY, requesting 90-day supply, last wrt 12.3.12 #90 wt/2-refills

## 2011-10-27 ENCOUNTER — Other Ambulatory Visit (INDEPENDENT_AMBULATORY_CARE_PROVIDER_SITE_OTHER): Payer: Medicare Other

## 2011-10-27 DIAGNOSIS — E785 Hyperlipidemia, unspecified: Secondary | ICD-10-CM

## 2011-10-27 LAB — LIPID PANEL
Cholesterol: 132 mg/dL (ref 0–200)
Triglycerides: 123 mg/dL (ref 0.0–149.0)

## 2011-10-27 LAB — BASIC METABOLIC PANEL
Calcium: 9.6 mg/dL (ref 8.4–10.5)
Creatinine, Ser: 0.8 mg/dL (ref 0.4–1.2)
GFR: 73.24 mL/min (ref >60.00)

## 2011-10-27 LAB — HEPATIC FUNCTION PANEL
AST: 26 U/L (ref 0–37)
Albumin: 4.1 g/dL (ref 3.5–5.2)
Alkaline Phosphatase: 36 U/L — ABNORMAL LOW (ref 39–117)
Total Protein: 6.8 g/dL (ref 6.0–8.3)

## 2011-12-11 ENCOUNTER — Other Ambulatory Visit: Payer: Self-pay | Admitting: Family Medicine

## 2011-12-11 MED ORDER — CLOPIDOGREL BISULFATE 75 MG PO TABS: ORAL_TABLET | ORAL | Status: DC

## 2011-12-11 MED ORDER — POTASSIUM CHLORIDE CRYS ER 20 MEQ PO TBCR: 20.0000 meq | EXTENDED_RELEASE_TABLET | Freq: Every day | ORAL | Status: DC

## 2011-12-11 MED ORDER — SERTRALINE HCL 50 MG PO TABS: 50.0000 mg | ORAL_TABLET | Freq: Every day | ORAL | Status: DC

## 2011-12-11 NOTE — Telephone Encounter (Signed)
Refills x 3  Last ov 2.7.13 3-month f/u    1-Sertraline HCl (Tab) 50 MG Take 1 tablet (50 mg total) by mouth daily # 90 last wrt 4.24.13 90 wt/1-refill  2-Clopidogrel Bisulfate (Tab) 75 MG TAKE 1 TABLET DAILY #90 last wrt 5.23.13 90 wt/1-refill  3-,KLOR-CON 20 MEQ Take 1 tablet (20 mEq total) by mouth daily. #90 last wrt 6.14.13 90 wt/1-refill   Plz advise       MW

## 2011-12-11 NOTE — Telephone Encounter (Signed)
Refills x 3  Last ov 2.7.13 103-month f/u    1-Sertraline HCl (Tab) 50 MG Take 1 tablet (50 mg total) by mouth daily # 90 last wrt 4.24.13 90 wt/1-refill  2-Clopidogrel Bisulfate (Tab) 75 MG TAKE 1 TABLET DAILY #90 last wrt 5.23.13 90 wt/1-refill  3-,KLOR-CON 20 MEQ Take 1 tablet (20 mEq total) by mouth daily. #90 last wrt 6.14.13 90 wt/1-refill

## 2011-12-11 NOTE — Telephone Encounter (Signed)
Left message advising pt Rx approved and need to call in and schedule 6 month f/U appt.      MW

## 2011-12-11 NOTE — Telephone Encounter (Signed)
Please let pt know she is due for her 6 month f/u

## 2011-12-14 ENCOUNTER — Other Ambulatory Visit: Payer: Self-pay | Admitting: Family Medicine

## 2011-12-14 MED ORDER — GABAPENTIN 600 MG PO TABS: 600.0000 mg | ORAL_TABLET | Freq: Every day | ORAL | Status: DC

## 2011-12-14 MED ORDER — PANTOPRAZOLE SODIUM 40 MG PO TBEC: 40.0000 mg | DELAYED_RELEASE_TABLET | Freq: Every day | ORAL | Status: DC

## 2011-12-14 MED ORDER — FENOFIBRATE 145 MG PO TABS: ORAL_TABLET | ORAL | Status: DC

## 2011-12-14 MED ORDER — TOLTERODINE TARTRATE ER 4 MG PO CP24: 4.0000 mg | ORAL_CAPSULE | Freq: Every day | ORAL | Status: DC

## 2011-12-14 MED ORDER — ATENOLOL 25 MG PO TABS: 25.0000 mg | ORAL_TABLET | Freq: Every day | ORAL | Status: DC

## 2011-12-14 MED ORDER — ATORVASTATIN CALCIUM 10 MG PO TABS: 10.0000 mg | ORAL_TABLET | Freq: Every day | ORAL | Status: DC

## 2011-12-14 MED ORDER — LEVOTHYROXINE SODIUM 112 MCG PO TABS: 112.0000 ug | ORAL_TABLET | Freq: Every day | ORAL | Status: DC

## 2011-12-14 NOTE — Telephone Encounter (Signed)
Refill done.  

## 2011-12-14 NOTE — Telephone Encounter (Signed)
Refills x 7 Last ov 2.7.13 96-month f/u --- requesting a 90-day supply of all 7  1-Atenolol (Tab) TENORMIN 25 MG Take 1 tablet (25 mg total) by mouth daily.--last wrt 8.20.13  2-Detrol LA Cap 4MG  Capsule Take 1 by mouth daily--last wrt 8.20.13  3-Fenofibrate tab 145mg  Take 1 by mouth daily --last wrt 8.20.13  4-Atorvastatin tab 10mg  tablet Take 1-tablet by mouth daily--last wrt 8.20.13  5-Gabapentin tab 600mg  tablet Take 1 tablet by mouth daily --last wrt 8.20.13  6-Pantoprazole tab 40mg  tablet ENT Take 1-tablet by mouth daily--last wrt 6.18.13  7-Levothyrox (0.112MG ) Tablet Take 1-by mouth daily--last wrt 3.20.13

## 2011-12-28 ENCOUNTER — Encounter: Payer: Self-pay | Admitting: Family Medicine

## 2011-12-28 ENCOUNTER — Ambulatory Visit (INDEPENDENT_AMBULATORY_CARE_PROVIDER_SITE_OTHER): Payer: Medicare Other | Admitting: Family Medicine

## 2011-12-28 VITALS — BP 136/72 | HR 62 | Temp 98.2°F | Wt 230.2 lb

## 2011-12-28 DIAGNOSIS — I10 Essential (primary) hypertension: Secondary | ICD-10-CM

## 2011-12-28 DIAGNOSIS — Z23 Encounter for immunization: Secondary | ICD-10-CM

## 2011-12-28 DIAGNOSIS — E785 Hyperlipidemia, unspecified: Secondary | ICD-10-CM

## 2011-12-28 NOTE — Patient Instructions (Signed)

## 2011-12-28 NOTE — Progress Notes (Signed)
  Subjective:    Patient here for follow-up of elevated blood pressure.  She is not exercising and is adherent to a low-salt diet.  Blood pressure is well controlled at home. Cardiac symptoms: none. Patient denies: chest pain, chest pressure/discomfort, claudication, dyspnea, exertional chest pressure/discomfort, fatigue, irregular heart beat, lower extremity edema, near-syncope, orthopnea, palpitations, paroxysmal nocturnal dyspnea, syncope and tachypnea. Cardiovascular risk factors: advanced age (older than 6 for men, 60 for women), dyslipidemia, hypertension, obesity (BMI >= 30 kg/m2) and sedentary lifestyle. Use of agents associated with hypertension: none. History of target organ damage: none.  The following portions of the patient's history were reviewed and updated as appropriate: allergies, current medications, past family history, past medical history, past social history, past surgical history and problem list.  Review of Systems Pertinent items are noted in HPI.     Objective:    BP 136/72  Pulse 62  Temp 98.2 F (36.8 C) (Oral)  Wt 230 lb 3.2 oz (104.418 kg)  SpO2 94% General appearance: alert, cooperative, appears stated age and no distress Lungs: clear to auscultation bilaterally Heart: S1, S2 normal Extremities: extremities normal, atraumatic, no cyanosis or edema    Assessment:    Hypertension, normal blood pressure . Evidence of target organ damage: none.    Plan:    Medication: no change. Dietary sodium restriction. Regular aerobic exercise. Check blood pressures 2-3 times weekly and record. Follow up: 3 months and as needed.

## 2011-12-29 DIAGNOSIS — Z23 Encounter for immunization: Secondary | ICD-10-CM

## 2011-12-29 NOTE — Assessment & Plan Note (Signed)
Check labs 

## 2012-02-12 ENCOUNTER — Telehealth: Payer: Self-pay | Admitting: Family Medicine

## 2012-02-12 MED ORDER — FENOFIBRATE 145 MG PO TABS: ORAL_TABLET | ORAL | Status: DC

## 2012-02-12 MED ORDER — LEVOTHYROXINE SODIUM 112 MCG PO TABS: 112.0000 ug | ORAL_TABLET | Freq: Every day | ORAL | Status: DC

## 2012-02-12 NOTE — Telephone Encounter (Signed)
Refill: fenofibrate tablet 145 mg. Take 1 by mouth daily. 90 day supply  Levothyroxine sodium tablet 112 mcg. Take 1 by mouth daily *Generic for Synthroid* 90 day supply

## 2012-02-22 ENCOUNTER — Encounter: Payer: Self-pay | Admitting: Family Medicine

## 2012-02-22 ENCOUNTER — Ambulatory Visit (INDEPENDENT_AMBULATORY_CARE_PROVIDER_SITE_OTHER): Payer: Medicare Other | Admitting: Family Medicine

## 2012-02-22 VITALS — BP 140/60 | HR 78 | Temp 98.2°F | Wt 227.2 lb

## 2012-02-22 DIAGNOSIS — R300 Dysuria: Secondary | ICD-10-CM

## 2012-02-22 DIAGNOSIS — R062 Wheezing: Secondary | ICD-10-CM

## 2012-02-22 DIAGNOSIS — J449 Chronic obstructive pulmonary disease, unspecified: Secondary | ICD-10-CM | POA: Insufficient documentation

## 2012-02-22 DIAGNOSIS — N390 Urinary tract infection, site not specified: Secondary | ICD-10-CM

## 2012-02-22 DIAGNOSIS — R350 Frequency of micturition: Secondary | ICD-10-CM

## 2012-02-22 LAB — POCT URINALYSIS DIPSTICK
Ketones, UA: NEGATIVE
Spec Grav, UA: >=1.03
Urobilinogen, UA: 1
pH, UA: 6

## 2012-02-22 MED ORDER — CEPHALEXIN 500 MG PO CAPS: 500.0000 mg | ORAL_CAPSULE | Freq: Two times a day (BID) | ORAL | Status: AC

## 2012-02-22 NOTE — Patient Instructions (Addendum)
Follow up in 2 weeks to recheck your wheezing/shortness of breath Start the Keflex twice daily for the bladder infection- take w/ food Drink plenty of fluids Call with any questions or concerns Happy Holidays!!!

## 2012-02-22 NOTE — Progress Notes (Signed)
  Subjective:    Patient ID: Kirsten Flores, female    DOB: Dec 04, 1923, 78 y.o.   MRN: [id number]  HPI SOB- pt reports she frequently wheezes, 'i'm unaware that i'm short of breath', no hx of asthma or COPD.  Denies recent cough or cold symptoms.  'am i short of breath?'  Dysuria- sxs initially started 2 weeks ago but worsened in the last week.  Increased frequency, urgency, incontinence- new for pt.  No blood in urine.  No fevers.  No new or different back pain.   Review of Systems For ROS see HPI     Objective:   Physical Exam  Vitals reviewed. Constitutional: She appears well-developed and well-nourished. No distress.  Neck: Normal range of motion. Neck supple.  Cardiovascular: Normal rate and regular rhythm.   Murmur (II/VI SEM heard best at RUSB) heard. Pulmonary/Chest: She has wheezes (w/ prolonged expiration).  Abdominal: Soft. She exhibits no distension. There is no tenderness (no suprapubic or CVA tenderness).  Musculoskeletal: She exhibits no edema.  Neurological: She is alert.       Some difficulty answering questions          Assessment & Plan:

## 2012-02-22 NOTE — Assessment & Plan Note (Signed)
New.  Pt reports she frequently wheezes but didn't think anyone else could hear it.  Is confused as to whether she is short of breath.  O2 93% on RA.  No distress.  No evidence of URI.  Will have pt f/u w/ PCP in 1-2 weeks to determine whether pt needs to see pulmonary or start inhalers for her wheezing.  Pt expressed understanding and is in agreement w/ plan.

## 2012-02-22 NOTE — Assessment & Plan Note (Signed)
New to provider.  Pt's UA consistent w/ infxn.  Start keflex.  Encouraged fluids.  Reviewed supportive care and red flags that should prompt return.  Pt expressed understanding and is in agreement w/ plan.

## 2012-02-24 LAB — URINE CULTURE

## 2012-02-25 ENCOUNTER — Encounter: Payer: Self-pay | Admitting: *Deleted

## 2012-03-03 ENCOUNTER — Encounter: Payer: Self-pay | Admitting: Family Medicine

## 2012-03-03 ENCOUNTER — Ambulatory Visit (INDEPENDENT_AMBULATORY_CARE_PROVIDER_SITE_OTHER): Payer: Medicare Other | Admitting: Family Medicine

## 2012-03-03 VITALS — BP 120/60 | HR 73 | Temp 97.6°F | Ht 62.25 in | Wt 223.0 lb

## 2012-03-03 DIAGNOSIS — N390 Urinary tract infection, site not specified: Secondary | ICD-10-CM

## 2012-03-03 LAB — POCT URINALYSIS DIPSTICK
Bilirubin, UA: NEGATIVE
Glucose, UA: NEGATIVE
Spec Grav, UA: 1.025
Urobilinogen, UA: 0.2

## 2012-03-03 MED ORDER — CIPROFLOXACIN HCL 500 MG PO TABS: 500.0000 mg | ORAL_TABLET | Freq: Two times a day (BID) | ORAL | Status: DC

## 2012-03-03 NOTE — Progress Notes (Signed)
  Subjective:    Patient ID: Kirsten Flores, female    DOB: 14-Nov-1923, 79 y.o.   MRN: [id number]  HPI UTI- pt was seen on 12/23 for UTI.  Took 7 days of keflex, sxs were improving but again returned 12/31.  + dysuria, frequency, urgency, hesitancy.  No fevers, back pain more than usual, hematuria.   Review of Systems For ROS see HPI     Objective:   Physical Exam  Vitals reviewed. Constitutional: She appears well-developed and well-nourished. No distress.  Abdominal: Soft. She exhibits no distension. There is no tenderness (no suprapubic or CVA tenderness).          Assessment & Plan:

## 2012-03-03 NOTE — Patient Instructions (Addendum)
This appears to be another bladder infection Take the Cipro twice daily Drink plenty of fluids Call with any questions or concerns Happy New Year!!!

## 2012-03-08 LAB — URINE CULTURE: Colony Count: >=100000

## 2012-03-08 NOTE — Assessment & Plan Note (Signed)
Recurrent issue for pt.  UA highly suspicious for infxn.  Start abx.  Pt to call if sxs don't improve.

## 2012-03-23 ENCOUNTER — Encounter: Payer: Self-pay | Admitting: Lab

## 2012-03-24 ENCOUNTER — Encounter: Payer: Self-pay | Admitting: Family Medicine

## 2012-03-24 ENCOUNTER — Ambulatory Visit (INDEPENDENT_AMBULATORY_CARE_PROVIDER_SITE_OTHER): Payer: Medicare Other | Admitting: Family Medicine

## 2012-03-24 VITALS — BP 140/56 | HR 72 | Temp 97.7°F | Wt 227.0 lb

## 2012-03-24 DIAGNOSIS — N390 Urinary tract infection, site not specified: Secondary | ICD-10-CM

## 2012-03-24 MED ORDER — CIPROFLOXACIN HCL 500 MG PO TABS: 500.0000 mg | ORAL_TABLET | Freq: Two times a day (BID) | ORAL | Status: DC

## 2012-03-24 NOTE — Assessment & Plan Note (Signed)
This is pt's 3rd UTI in a month.  Will again culture urine.  Recommended Urology referral but pt declined.  Start abx.  Reviewed supportive care and red flags that should prompt return.  Pt expressed understanding and is in agreement w/ plan.

## 2012-03-24 NOTE — Patient Instructions (Addendum)
This is another UTI Start the cipro twice daily Drink plenty of fluids If you change your mind and want to see the urologist- please call Sherri Rad in there!!!

## 2012-03-24 NOTE — Addendum Note (Signed)
Addended by: Silvio Pate D on: 03/24/2012 04:04 PM   Modules accepted: Orders

## 2012-03-24 NOTE — Progress Notes (Signed)
  Subjective:    Patient ID: Kirsten Flores, female    DOB: [birth year], 79 y.o.   MRN: [id number]  HPI UTI- pt was seen 12/23, 1/2, and again today for urinary sxs.  Developed dysuria again 5 days ago.  Denies increased frequency.  No blood in urine.  + urgency w/ incontinence.  No fevers, no back pain or CVA tenderness.  Denies baths/bubble baths.  No change in routine.   Review of Systems For ROS see HPI     Objective:   Physical Exam  Vitals reviewed. Constitutional: She appears well-developed and well-nourished. No distress.  Abdominal: Soft. She exhibits no distension. There is no tenderness (no suprapubic or CVA tenderness).          Assessment & Plan:

## 2012-03-27 LAB — URINE CULTURE: Colony Count: >=100000

## 2012-05-02 ENCOUNTER — Telehealth: Payer: Self-pay | Admitting: Family Medicine

## 2012-05-02 MED ORDER — TOLTERODINE TARTRATE ER 4 MG PO CP24: 4.0000 mg | ORAL_CAPSULE | Freq: Every day | ORAL | Status: DC

## 2012-05-02 MED ORDER — PANTOPRAZOLE SODIUM 40 MG PO TBEC: 40.0000 mg | DELAYED_RELEASE_TABLET | Freq: Every day | ORAL | Status: DC

## 2012-05-02 NOTE — Telephone Encounter (Signed)
refill Pantoprazole 40 MG Take 1 tablet (40 mg total) by mouth daily. - requesting 90-day supply no last fill date listed last wrt 10.14.13

## 2012-05-02 NOTE — Telephone Encounter (Signed)
also refill detrol la cap 4mg  capsule sr - take 1 tablet by mouth daily last wrt 10.14.13

## 2012-05-16 ENCOUNTER — Telehealth: Payer: Self-pay | Admitting: Family Medicine

## 2012-05-16 MED ORDER — ATENOLOL 25 MG PO TABS: 25.0000 mg | ORAL_TABLET | Freq: Every day | ORAL | Status: DC

## 2012-05-16 MED ORDER — ATORVASTATIN CALCIUM 10 MG PO TABS: ORAL_TABLET | ORAL | Status: DC

## 2012-05-16 NOTE — Telephone Encounter (Signed)
refills x 2 requesting a 90-day supply   1-Atenolol Tab 25MG  Tablet Take 1 tablet by mouth daily -- last wrt  10.14.13 #90  2-Atorvastatin Tab 10MG  Tablet Take 1 tablet by mouth daily last wrt 10.14.13 #90

## 2012-06-22 ENCOUNTER — Telehealth: Payer: Self-pay | Admitting: Family Medicine

## 2012-06-22 MED ORDER — SERTRALINE HCL 50 MG PO TABS: 50.0000 mg | ORAL_TABLET | Freq: Every day | ORAL | Status: DC

## 2012-06-22 NOTE — Telephone Encounter (Signed)
Refill: Sertraline tab 50 mg. Take 1 by mouth daily. 90 day supply

## 2012-07-26 ENCOUNTER — Encounter: Payer: Medicare Other | Admitting: Family Medicine

## 2012-07-26 ENCOUNTER — Encounter: Payer: Self-pay | Admitting: Lab

## 2012-07-27 ENCOUNTER — Ambulatory Visit (INDEPENDENT_AMBULATORY_CARE_PROVIDER_SITE_OTHER): Payer: Medicare Other | Admitting: Family Medicine

## 2012-07-27 ENCOUNTER — Encounter: Payer: Self-pay | Admitting: Family Medicine

## 2012-07-27 VITALS — BP 122/60 | HR 70 | Temp 99.1°F | Ht 62.25 in | Wt 222.8 lb

## 2012-07-27 DIAGNOSIS — I639 Cerebral infarction, unspecified: Secondary | ICD-10-CM

## 2012-07-27 DIAGNOSIS — R32 Unspecified urinary incontinence: Secondary | ICD-10-CM

## 2012-07-27 DIAGNOSIS — R0602 Shortness of breath: Secondary | ICD-10-CM

## 2012-07-27 DIAGNOSIS — E785 Hyperlipidemia, unspecified: Secondary | ICD-10-CM

## 2012-07-27 DIAGNOSIS — Z0000 Encounter for general adult medical examination without abnormal findings: Secondary | ICD-10-CM

## 2012-07-27 DIAGNOSIS — I10 Essential (primary) hypertension: Secondary | ICD-10-CM

## 2012-07-27 DIAGNOSIS — E039 Hypothyroidism, unspecified: Secondary | ICD-10-CM

## 2012-07-27 DIAGNOSIS — F329 Major depressive disorder, single episode, unspecified: Secondary | ICD-10-CM

## 2012-07-27 DIAGNOSIS — I6350 Cerebral infarction due to unspecified occlusion or stenosis of unspecified cerebral artery: Secondary | ICD-10-CM

## 2012-07-27 MED ORDER — CLOPIDOGREL BISULFATE 75 MG PO TABS: ORAL_TABLET | ORAL | Status: DC

## 2012-07-27 MED ORDER — POTASSIUM CHLORIDE CRYS ER 20 MEQ PO TBCR: 20.0000 meq | EXTENDED_RELEASE_TABLET | Freq: Every day | ORAL | Status: DC

## 2012-07-27 MED ORDER — LEVOTHYROXINE SODIUM 112 MCG PO TABS: 112.0000 ug | ORAL_TABLET | Freq: Every day | ORAL | Status: DC

## 2012-07-27 MED ORDER — GABAPENTIN 600 MG PO TABS: 600.0000 mg | ORAL_TABLET | Freq: Every day | ORAL | Status: DC

## 2012-07-27 MED ORDER — TOLTERODINE TARTRATE ER 4 MG PO CP24: 4.0000 mg | ORAL_CAPSULE | Freq: Every day | ORAL | Status: DC

## 2012-07-27 MED ORDER — SERTRALINE HCL 50 MG PO TABS: 50.0000 mg | ORAL_TABLET | Freq: Every day | ORAL | Status: DC

## 2012-07-27 NOTE — Progress Notes (Signed)
Subjective:    Kirsten Flores is a 79 y.o. female who presents for Medicare Annual/Subsequent preventive examination.  Preventive Screening-Counseling & Management  Tobacco History  Smoking status  . Never Smoker   Smokeless tobacco  . Never Used     Problems Prior to Visit 1.   Current Problems (verified) Patient Active Problem List   Diagnosis Date Noted  . Wheezing 02/22/2012  . Bronchitis 07/08/2010  . OSTEOPENIA 01/03/2009  . MEMORY LOSS 12/30/2007  . NUMBNESS, HAND 06/15/2007  . URI 03/01/2007  . LACERATION, SCALP 03/01/2007  . GAIT DISTURBANCE 02/22/2007  . Recurrent UTI 02/08/2007  . HYPOTHYROIDISM 08/27/2006  . B12 DEFICIENCY 08/27/2006  . HYPERLIPIDEMIA 08/27/2006  . HYPERTENSION 08/27/2006  . KNEE PAIN, RIGHT 08/27/2006    Medications Prior to Visit Current Outpatient Prescriptions on File Prior to Visit  Medication Sig Dispense Refill  . atenolol (TENORMIN) 25 MG tablet Take 1 tablet (25 mg total) by mouth daily.  90 tablet  0  . atorvastatin (LIPITOR) 10 MG tablet 1 tab by mouth daily--office visit with labs are due  90 tablet  0  . betaxolol (BETOPTIC-S) 0.25 % ophthalmic suspension Place 1 drop into both eyes 2 (two) times daily.        . clopidogrel (PLAVIX) 75 MG tablet TAKE 1 TABLET DAILY  90 tablet  1  . ezetimibe-simvastatin (VYTORIN) 10-20 MG per tablet Take 1 tablet by mouth at bedtime.        . fenofibrate (TRICOR) 145 MG tablet TAKE 1 TABLET DAILY  90 tablet  1  . ferrous sulfate 325 (65 FE) MG tablet Take 325 mg by mouth daily with breakfast.        . gabapentin (NEURONTIN) 600 MG tablet Take 1 tablet (600 mg total) by mouth daily.  90 tablet  0  . HYDROcodone-acetaminophen (VICODIN ES) 7.5-750 MG per tablet Take 1 tablet by mouth every 6 (six) hours as needed.        Marland Kitchen levothyroxine (SYNTHROID, LEVOTHROID) 112 MCG tablet Take 1 tablet (112 mcg total) by mouth daily.  90 tablet  1  . Multiple Vitamin (MULTIVITAMIN) tablet Take 1 tablet by  mouth daily.        . pantoprazole (PROTONIX) 40 MG tablet Take 1 tablet (40 mg total) by mouth daily.  90 tablet  1  . potassium chloride SA (K-DUR,KLOR-CON) 20 MEQ tablet Take 1 tablet (20 mEq total) by mouth daily.  90 tablet  1  . sertraline (ZOLOFT) 50 MG tablet Take 1 tablet (50 mg total) by mouth daily.  90 tablet  1  . tolterodine (DETROL LA) 4 MG 24 hr capsule Take 1 capsule (4 mg total) by mouth daily.  90 capsule  1  . traMADol (ULTRAM) 50 MG tablet Take 1 tablet (50 mg total) by mouth every 6 (six) hours as needed.  90 tablet  2  . valsartan-hydrochlorothiazide (DIOVAN-HCT) 160-25 MG per tablet Take 1 tablet by mouth daily.        . [DISCONTINUED] tolterodine (DETROL LA) 4 MG 24 hr capsule Take 1 capsule (4 mg total) by mouth daily.  90 capsule  0   No current facility-administered medications on file prior to visit.    Current Medications (verified) Current Outpatient Prescriptions  Medication Sig Dispense Refill  . atenolol (TENORMIN) 25 MG tablet Take 1 tablet (25 mg total) by mouth daily.  90 tablet  0  . atorvastatin (LIPITOR) 10 MG tablet 1 tab by mouth daily--office visit with  labs are due  90 tablet  0  . betaxolol (BETOPTIC-S) 0.25 % ophthalmic suspension Place 1 drop into both eyes 2 (two) times daily.        . clopidogrel (PLAVIX) 75 MG tablet TAKE 1 TABLET DAILY  90 tablet  1  . ezetimibe-simvastatin (VYTORIN) 10-20 MG per tablet Take 1 tablet by mouth at bedtime.        . fenofibrate (TRICOR) 145 MG tablet TAKE 1 TABLET DAILY  90 tablet  1  . ferrous sulfate 325 (65 FE) MG tablet Take 325 mg by mouth daily with breakfast.        . gabapentin (NEURONTIN) 600 MG tablet Take 1 tablet (600 mg total) by mouth daily.  90 tablet  0  . HYDROcodone-acetaminophen (VICODIN ES) 7.5-750 MG per tablet Take 1 tablet by mouth every 6 (six) hours as needed.        Marland Kitchen levothyroxine (SYNTHROID, LEVOTHROID) 112 MCG tablet Take 1 tablet (112 mcg total) by mouth daily.  90 tablet  1  .  Multiple Vitamin (MULTIVITAMIN) tablet Take 1 tablet by mouth daily.        . pantoprazole (PROTONIX) 40 MG tablet Take 1 tablet (40 mg total) by mouth daily.  90 tablet  1  . potassium chloride SA (K-DUR,KLOR-CON) 20 MEQ tablet Take 1 tablet (20 mEq total) by mouth daily.  90 tablet  1  . sertraline (ZOLOFT) 50 MG tablet Take 1 tablet (50 mg total) by mouth daily.  90 tablet  1  . tolterodine (DETROL LA) 4 MG 24 hr capsule Take 1 capsule (4 mg total) by mouth daily.  90 capsule  1  . traMADol (ULTRAM) 50 MG tablet Take 1 tablet (50 mg total) by mouth every 6 (six) hours as needed.  90 tablet  2  . valsartan-hydrochlorothiazide (DIOVAN-HCT) 160-25 MG per tablet Take 1 tablet by mouth daily.        . [DISCONTINUED] tolterodine (DETROL LA) 4 MG 24 hr capsule Take 1 capsule (4 mg total) by mouth daily.  90 capsule  0   No current facility-administered medications for this visit.     Allergies (verified) Diltiazem hcl and Neomycin   PAST HISTORY  Family History Family History  Problem Relation Age of Onset  . Colon cancer    . Melanoma    . Cancer Other     COLON...1ST DEGREE RELATIVE    Social History History  Substance Use Topics  . Smoking status: Never Smoker   . Smokeless tobacco: Never Used  . Alcohol Use: Yes     Are there smokers in your home (other than you)? No  Risk Factors Current exercise habits: The patient does not participate in regular exercise at present.  Dietary issues discussed: na   Cardiac risk factors: advanced age (older than 32 for men, 64 for women), dyslipidemia, hypertension, obesity (BMI >= 30 kg/m2) and sedentary lifestyle.  Depression Screen (Note: if answer to either of the following is "Yes", a more complete depression screening is indicated)   Over the past two weeks, have you felt down, depressed or hopeless? No  Over the past two weeks, have you felt little interest or pleasure in doing things? No  Have you lost interest or pleasure in  daily life? No  Do you often feel hopeless? No  Do you cry easily over simple problems? No  Activities of Daily Living In your present state of health, do you have any difficulty performing the following activities?:  Driving? Yes Managing money?  Yes Feeding yourself? no Getting from bed to chair? no Climbing a flight of stairs? Yes Preparing food and eating?: no Bathing or showering? no Getting dressed: Yes Getting to the toilet? No Using the toilet:Yes Moving around from place to place: Yes In the past year have you fallen or had a near fall?:No   Are you sexually active?  No  Do you have more than one partner?  No  Hearing Difficulties: No Do you often ask people to speak up or repeat themselves? No Do you experience ringing or noises in your ears? No Do you have difficulty understanding soft or whispered voices? No   Do you feel that you have a problem with memory? No  Do you often misplace items? No  Do you feel safe at home?  Yes  Cognitive Testing  Alert? Yes  Normal Appearance?Yes  Oriented to person? Yes  Place? Yes   Time? Yes  Recall of three objects?  Yes  Can perform simple calculations? Yes  Displays appropriate judgment?Yes  Can read the correct time from a watch face?Yes   Advanced Directives have been discussed with the patient? Yes  List the Names of Other Physician/Practitioners you currently use: 1.  oph--bon  Indicate any recent Medical Services you may have received from other than [facility] providers in the past year (date may be approximate).  Immunization History  Administered Date(s) Administered  . Influenza Split 12/02/2010, 12/29/2011  . Influenza Whole 12/28/2006, 12/30/2007, 01/03/2009, 12/17/2009  . Pneumococcal Polysaccharide 03/02/2001  . Zoster 12/28/2010    Screening Tests Health Maintenance  Topic Date Due  . Tetanus/tdap  04/03/1944  . Colonoscopy  10/10/2012  . Influenza Vaccine  10/31/2012  . Pneumococcal Polysaccharide  Vaccine Age 27 And Over  Completed  . Zostavax  Completed    All answers were reviewed with the patient and necessary referrals were made:  Loreen Freud, DO   07/27/2012   History reviewed:  She  has a past medical history of Hyperlipidemia; Hypertension; Osteopenia; Thyroid disease; B12 deficiency; Arthritis; Memory loss; Knee pain; and Gait disturbance. She  does not have any pertinent problems on file. She  has past surgical history that includes Appendectomy; Cholecystectomy; Abdominal hysterectomy; and Oophorectomy. Her family history includes Cancer in her other; Colon cancer in an unspecified family member; and Melanoma in an unspecified family member. She  reports that she has never smoked. She has never used smokeless tobacco. She reports that  drinks alcohol. She reports that she does not use illicit drugs. She has a current medication list which includes the following prescription(s): atenolol, atorvastatin, betaxolol, clopidogrel, ezetimibe-simvastatin, fenofibrate, ferrous sulfate, gabapentin, hydrocodone-acetaminophen, levothyroxine, multivitamin, pantoprazole, potassium chloride sa, sertraline, tolterodine, tramadol, and valsartan-hydrochlorothiazide. Current Outpatient Prescriptions on File Prior to Visit  Medication Sig Dispense Refill  . atenolol (TENORMIN) 25 MG tablet Take 1 tablet (25 mg total) by mouth daily.  90 tablet  0  . atorvastatin (LIPITOR) 10 MG tablet 1 tab by mouth daily--office visit with labs are due  90 tablet  0  . betaxolol (BETOPTIC-S) 0.25 % ophthalmic suspension Place 1 drop into both eyes 2 (two) times daily.        . clopidogrel (PLAVIX) 75 MG tablet TAKE 1 TABLET DAILY  90 tablet  1  . ezetimibe-simvastatin (VYTORIN) 10-20 MG per tablet Take 1 tablet by mouth at bedtime.        . fenofibrate (TRICOR) 145 MG tablet TAKE 1 TABLET DAILY  90 tablet  1  . ferrous sulfate 325 (65 FE) MG tablet Take 325 mg by mouth daily with breakfast.        . gabapentin  (NEURONTIN) 600 MG tablet Take 1 tablet (600 mg total) by mouth daily.  90 tablet  0  . HYDROcodone-acetaminophen (VICODIN ES) 7.5-750 MG per tablet Take 1 tablet by mouth every 6 (six) hours as needed.        Marland Kitchen levothyroxine (SYNTHROID, LEVOTHROID) 112 MCG tablet Take 1 tablet (112 mcg total) by mouth daily.  90 tablet  1  . Multiple Vitamin (MULTIVITAMIN) tablet Take 1 tablet by mouth daily.        . pantoprazole (PROTONIX) 40 MG tablet Take 1 tablet (40 mg total) by mouth daily.  90 tablet  1  . potassium chloride SA (K-DUR,KLOR-CON) 20 MEQ tablet Take 1 tablet (20 mEq total) by mouth daily.  90 tablet  1  . sertraline (ZOLOFT) 50 MG tablet Take 1 tablet (50 mg total) by mouth daily.  90 tablet  1  . tolterodine (DETROL LA) 4 MG 24 hr capsule Take 1 capsule (4 mg total) by mouth daily.  90 capsule  1  . traMADol (ULTRAM) 50 MG tablet Take 1 tablet (50 mg total) by mouth every 6 (six) hours as needed.  90 tablet  2  . valsartan-hydrochlorothiazide (DIOVAN-HCT) 160-25 MG per tablet Take 1 tablet by mouth daily.        . [DISCONTINUED] tolterodine (DETROL LA) 4 MG 24 hr capsule Take 1 capsule (4 mg total) by mouth daily.  90 capsule  0   No current facility-administered medications on file prior to visit.   She is allergic to diltiazem hcl and neomycin.  Review of Systems  Review of Systems  Constitutional: Negative for activity change, appetite change and fatigue.  HENT: Negative for hearing loss, congestion, tinnitus and ear discharge.   Eyes: Negative for visual disturbance (see optho q1y -) Respiratory: Negative for cough, chest tightness and shortness of breath.   Cardiovascular: Negative for chest pain, palpitations and leg swelling.  Gastrointestinal: Negative for abdominal pain, diarrhea, constipation and abdominal distention.  Genitourinary: Negative for urgency, frequency, decreased urine volume and difficulty urinating.  Musculoskeletal: Negative for back pain, arthralgias and  gait problem.  Skin: Negative for color change, pallor and rash.  Neurological: Negative for dizziness, light-headedness, numbness and headaches.  Hematological: Negative for adenopathy. Does not bruise/bleed easily.  Psychiatric/Behavioral: Negative for suicidal ideas, confusion, sleep disturbance, self-injury, dysphoric mood, decreased concentration and agitation.  Pt is able to read and write and can do all ADLs No risk for falling No abuse/ violence in home      Objective:     Vision by Snellen chart: opth Body mass index is 40.43 kg/(m^2). BP 122/60  Pulse 70  Temp(Src) 99.1 F (37.3 C) (Tympanic)  Ht 5' 2.25" (1.581 m)  Wt 222 lb 12.8 oz (101.061 kg)  BMI 40.43 kg/m2  SpO2 92%  BP 122/60  Pulse 70  Temp(Src) 99.1 F (37.3 C) (Tympanic)  Ht 5' 2.25" (1.581 m)  Wt 222 lb 12.8 oz (101.061 kg)  BMI 40.43 kg/m2  SpO2 92% General appearance: alert, cooperative, appears stated age, no distress and morbidly obese Head: Normocephalic, without obvious abnormality, atraumatic Eyes: negative findings: lids and lashes normal, conjunctivae and sclerae normal and pupils equal, round, reactive to light and accomodation Ears: normal TM's and external ear canals both ears Nose: Nares normal. Septum midline. Mucosa normal. No drainage or sinus tenderness.  Throat: lips, mucosa, and tongue normal; teeth and gums normal Neck: no adenopathy, no carotid bruit, no JVD, supple, symmetrical, trachea midline and thyroid not enlarged, symmetric, no tenderness/mass/nodules Back: negative Lungs: clear to auscultation bilaterally Breasts: pt refused exam Heart: S1, S2 normal and + murmur Abdomen: soft, non-tender; bowel sounds normal; no masses,  no organomegaly Pelvic: not indicated; post-menopausal, no abnormal Pap smears in past Extremities: extremities normal, atraumatic, no cyanosis or edema Pulses: 2+ and symmetric Skin: + seb keratosis, seems worse on forehead, pt refuses tx or  referral Lymph nodes: Cervical, supraclavicular, and axillary nodes normal. Neurologic: Mental status: Alert, oriented, thought content appropriate Gait: walks with walker Psych-- no depression, no anxety      Assessment:     cpe     Plan:     During the course of the visit the patient was educated and counseled about appropriate screening and preventive services including:    Pneumococcal vaccine   Influenza vaccine  Td vaccine  Screening mammography  Screening Pap smear and pelvic exam   Bone densitometry screening  Colorectal cancer screening  Glaucoma screening  Advanced directives: has an advanced directive - a copy HAS NOT been provided.  Diet review for nutrition referral? Yes ____  Not Indicated ___x_   Patient Instructions (the written plan) was given to the patient.  Medicare Attestation I have personally reviewed: The patient's medical and social history Their use of alcohol, tobacco or illicit drugs Their current medications and supplements The patient's functional ability including ADLs,fall risks, home safety risks, cognitive, and hearing and visual impairment Diet and physical activities Evidence for depression or mood disorders  The patient's weight, height, BMI, and visual acuity have been recorded in the chart.  I have made referrals, counseling, and provided education to the patient based on review of the above and I have provided the patient with a written personalized care plan for preventive services.     Loreen Freud, DO   07/27/2012

## 2012-07-27 NOTE — Patient Instructions (Signed)
Preventive Care for Adults, Female A healthy lifestyle and preventive care can promote health and wellness. Preventive health guidelines for women include the following key practices.  A routine yearly physical is a good way to check with your caregiver about your health and preventive screening. It is a chance to share any concerns and updates on your health, and to receive a thorough exam.  Visit your dentist for a routine exam and preventive care every 6 months. Brush your teeth twice a day and floss once a day. Good oral hygiene prevents tooth decay and gum disease.  The frequency of eye exams is based on your age, health, family medical history, use of contact lenses, and other factors. Follow your caregiver's recommendations for frequency of eye exams.  Eat a healthy diet. Foods like vegetables, fruits, whole grains, low-fat dairy products, and lean protein foods contain the nutrients you need without too many calories. Decrease your intake of foods high in solid fats, added sugars, and salt. Eat the right amount of calories for you.Get information about a proper diet from your caregiver, if necessary.  Regular physical exercise is one of the most important things you can do for your health. Most adults should get at least 150 minutes of moderate-intensity exercise (any activity that increases your heart rate and causes you to sweat) each week. In addition, most adults need muscle-strengthening exercises on 2 or more days a week.  Maintain a healthy weight. The body mass index (BMI) is a screening tool to identify possible weight problems. It provides an estimate of body fat based on height and weight. Your caregiver can help determine your BMI, and can help you achieve or maintain a healthy weight.For adults 20 years and older:  A BMI below 18.5 is considered underweight.  A BMI of 18.5 to 24.9 is normal.  A BMI of 25 to 29.9 is considered overweight.  A BMI of 30 and above is  considered obese.  Maintain normal blood lipids and cholesterol levels by exercising and minimizing your intake of saturated fat. Eat a balanced diet with plenty of fruit and vegetables. Blood tests for lipids and cholesterol should begin at age 20 and be repeated every 5 years. If your lipid or cholesterol levels are high, you are over 50, or you are at high risk for heart disease, you may need your cholesterol levels checked more frequently.Ongoing high lipid and cholesterol levels should be treated with medicines if diet and exercise are not effective.  If you smoke, find out from your caregiver how to quit. If you do not use tobacco, do not start.  If you are pregnant, do not drink alcohol. If you are breastfeeding, be very cautious about drinking alcohol. If you are not pregnant and choose to drink alcohol, do not exceed 1 drink per day. One drink is considered to be 12 ounces (355 mL) of beer, 5 ounces (148 mL) of wine, or 1.5 ounces (44 mL) of liquor.  Avoid use of street drugs. Do not share needles with anyone. Ask for help if you need support or instructions about stopping the use of drugs.  High blood pressure causes heart disease and increases the risk of stroke. Your blood pressure should be checked at least every 1 to 2 years. Ongoing high blood pressure should be treated with medicines if weight loss and exercise are not effective.  If you are 55 to 77 years old, ask your caregiver if you should take aspirin to prevent strokes.  Diabetes   screening involves taking a blood sample to check your fasting blood sugar level. This should be done once every 3 years, after age 45, if you are within normal weight and without risk factors for diabetes. Testing should be considered at a younger age or be carried out more frequently if you are overweight and have at least 1 risk factor for diabetes.  Breast cancer screening is essential preventive care for women. You should practice "breast  self-awareness." This means understanding the normal appearance and feel of your breasts and may include breast self-examination. Any changes detected, no matter how small, should be reported to a caregiver. Women in their 20s and 30s should have a clinical breast exam (CBE) by a caregiver as part of a regular health exam every 1 to 3 years. After age 40, women should have a CBE every year. Starting at age 40, women should consider having a mammography (breast X-ray test) every year. Women who have a family history of breast cancer should talk to their caregiver about genetic screening. Women at a high risk of breast cancer should talk to their caregivers about having magnetic resonance imaging (MRI) and a mammography every year.  The Pap test is a screening test for cervical cancer. A Pap test can show cell changes on the cervix that might become cervical cancer if left untreated. A Pap test is a procedure in which cells are obtained and examined from the lower end of the uterus (cervix).  Women should have a Pap test starting at age 21.  Between ages 21 and 29, Pap tests should be repeated every 2 years.  Beginning at age 30, you should have a Pap test every 3 years as long as the past 3 Pap tests have been normal.  Some women have medical problems that increase the chance of getting cervical cancer. Talk to your caregiver about these problems. It is especially important to talk to your caregiver if a new problem develops soon after your last Pap test. In these cases, your caregiver may recommend more frequent screening and Pap tests.  The above recommendations are the same for women who have or have not gotten the vaccine for human papillomavirus (HPV).  If you had a hysterectomy for a problem that was not cancer or a condition that could lead to cancer, then you no longer need Pap tests. Even if you no longer need a Pap test, a regular exam is a good idea to make sure no other problems are  starting.  If you are between ages 65 and 70, and you have had normal Pap tests going back 10 years, you no longer need Pap tests. Even if you no longer need a Pap test, a regular exam is a good idea to make sure no other problems are starting.  If you have had past treatment for cervical cancer or a condition that could lead to cancer, you need Pap tests and screening for cancer for at least 20 years after your treatment.  If Pap tests have been discontinued, risk factors (such as a new sexual partner) need to be reassessed to determine if screening should be resumed.  The HPV test is an additional test that may be used for cervical cancer screening. The HPV test looks for the virus that can cause the cell changes on the cervix. The cells collected during the Pap test can be tested for HPV. The HPV test could be used to screen women aged 30 years and older, and should   be used in women of any age who have unclear Pap test results. After the age of 30, women should have HPV testing at the same frequency as a Pap test.  Colorectal cancer can be detected and often prevented. Most routine colorectal cancer screening begins at the age of 50 and continues through age 75. However, your caregiver may recommend screening at an earlier age if you have risk factors for colon cancer. On a yearly basis, your caregiver may provide home test kits to check for hidden blood in the stool. Use of a small camera at the end of a tube, to directly examine the colon (sigmoidoscopy or colonoscopy), can detect the earliest forms of colorectal cancer. Talk to your caregiver about this at age 50, when routine screening begins. Direct examination of the colon should be repeated every 5 to 10 years through age 75, unless early forms of pre-cancerous polyps or small growths are found.  Hepatitis C blood testing is recommended for all people born from 1945 through 1965 and any individual with known risks for hepatitis C.  Practice  safe sex. Use condoms and avoid high-risk sexual practices to reduce the spread of sexually transmitted infections (STIs). STIs include gonorrhea, chlamydia, syphilis, trichomonas, herpes, HPV, and human immunodeficiency virus (HIV). Herpes, HIV, and HPV are viral illnesses that have no cure. They can result in disability, cancer, and death. Sexually active women aged 25 and younger should be checked for chlamydia. Older women with new or multiple partners should also be tested for chlamydia. Testing for other STIs is recommended if you are sexually active and at increased risk.  Osteoporosis is a disease in which the bones lose minerals and strength with aging. This can result in serious bone fractures. The risk of osteoporosis can be identified using a bone density scan. Women ages 65 and over and women at risk for fractures or osteoporosis should discuss screening with their caregivers. Ask your caregiver whether you should take a calcium supplement or vitamin D to reduce the rate of osteoporosis.  Menopause can be associated with physical symptoms and risks. Hormone replacement therapy is available to decrease symptoms and risks. You should talk to your caregiver about whether hormone replacement therapy is right for you.  Use sunscreen with sun protection factor (SPF) of 30 or more. Apply sunscreen liberally and repeatedly throughout the day. You should seek shade when your shadow is shorter than you. Protect yourself by wearing long sleeves, pants, a wide-brimmed hat, and sunglasses year round, whenever you are outdoors.  Once a month, do a whole body skin exam, using a mirror to look at the skin on your back. Notify your caregiver of new moles, moles that have irregular borders, moles that are larger than a pencil eraser, or moles that have changed in shape or color.  Stay current with required immunizations.  Influenza. You need a dose every fall (or winter). The composition of the flu vaccine  changes each year, so being vaccinated once is not enough.  Pneumococcal polysaccharide. You need 1 to 2 doses if you smoke cigarettes or if you have certain chronic medical conditions. You need 1 dose at age 65 (or older) if you have never been vaccinated.  Tetanus, diphtheria, pertussis (Tdap, Td). Get 1 dose of Tdap vaccine if you are younger than age 65, are over 65 and have contact with an infant, are a healthcare worker, are pregnant, or simply want to be protected from whooping cough. After that, you need a Td   booster dose every 10 years. Consult your caregiver if you have not had at least 3 tetanus and diphtheria-containing shots sometime in your life or have a deep or dirty wound.  HPV. You need this vaccine if you are a woman age 26 or younger. The vaccine is given in 3 doses over 6 months.  Measles, mumps, rubella (MMR). You need at least 1 dose of MMR if you were born in 1957 or later. You may also need a second dose.  Meningococcal. If you are age 19 to 21 and a first-year college student living in a residence hall, or have one of several medical conditions, you need to get vaccinated against meningococcal disease. You may also need additional booster doses.  Zoster (shingles). If you are age 60 or older, you should get this vaccine.  Varicella (chickenpox). If you have never had chickenpox or you were vaccinated but received only 1 dose, talk to your caregiver to find out if you need this vaccine.  Hepatitis A. You need this vaccine if you have a specific risk factor for hepatitis A virus infection or you simply wish to be protected from this disease. The vaccine is usually given as 2 doses, 6 to 18 months apart.  Hepatitis B. You need this vaccine if you have a specific risk factor for hepatitis B virus infection or you simply wish to be protected from this disease. The vaccine is given in 3 doses, usually over 6 months. Preventive Services / Frequency Ages 19 to 39  Blood  pressure check.** / Every 1 to 2 years.  Lipid and cholesterol check.** / Every 5 years beginning at age 20.  Clinical breast exam.** / Every 3 years for women in their 20s and 30s.  Pap test.** / Every 2 years from ages 21 through 29. Every 3 years starting at age 30 through age 65 or 70 with a history of 3 consecutive normal Pap tests.  HPV screening.** / Every 3 years from ages 30 through ages 65 to 70 with a history of 3 consecutive normal Pap tests.  Hepatitis C blood test.** / For any individual with known risks for hepatitis C.  Skin self-exam. / Monthly.  Influenza immunization.** / Every year.  Pneumococcal polysaccharide immunization.** / 1 to 2 doses if you smoke cigarettes or if you have certain chronic medical conditions.  Tetanus, diphtheria, pertussis (Tdap, Td) immunization. / A one-time dose of Tdap vaccine. After that, you need a Td booster dose every 10 years.  HPV immunization. / 3 doses over 6 months, if you are 26 and younger.  Measles, mumps, rubella (MMR) immunization. / You need at least 1 dose of MMR if you were born in 1957 or later. You may also need a second dose.  Meningococcal immunization. / 1 dose if you are age 19 to 21 and a first-year college student living in a residence hall, or have one of several medical conditions, you need to get vaccinated against meningococcal disease. You may also need additional booster doses.  Varicella immunization.** / Consult your caregiver.  Hepatitis A immunization.** / Consult your caregiver. 2 doses, 6 to 18 months apart.  Hepatitis B immunization.** / Consult your caregiver. 3 doses usually over 6 months. Ages 40 to 64  Blood pressure check.** / Every 1 to 2 years.  Lipid and cholesterol check.** / Every 5 years beginning at age 20.  Clinical breast exam.** / Every year after age 40.  Mammogram.** / Every year beginning at age 40   and continuing for as long as you are in good health. Consult with your  caregiver.  Pap test.** / Every 3 years starting at age 30 through age 65 or 70 with a history of 3 consecutive normal Pap tests.  HPV screening.** / Every 3 years from ages 30 through ages 65 to 70 with a history of 3 consecutive normal Pap tests.  Fecal occult blood test (FOBT) of stool. / Every year beginning at age 50 and continuing until age 75. You may not need to do this test if you get a colonoscopy every 10 years.  Flexible sigmoidoscopy or colonoscopy.** / Every 5 years for a flexible sigmoidoscopy or every 10 years for a colonoscopy beginning at age 50 and continuing until age 75.  Hepatitis C blood test.** / For all people born from 1945 through 1965 and any individual with known risks for hepatitis C.  Skin self-exam. / Monthly.  Influenza immunization.** / Every year.  Pneumococcal polysaccharide immunization.** / 1 to 2 doses if you smoke cigarettes or if you have certain chronic medical conditions.  Tetanus, diphtheria, pertussis (Tdap, Td) immunization.** / A one-time dose of Tdap vaccine. After that, you need a Td booster dose every 10 years.  Measles, mumps, rubella (MMR) immunization. / You need at least 1 dose of MMR if you were born in 1957 or later. You may also need a second dose.  Varicella immunization.** / Consult your caregiver.  Meningococcal immunization.** / Consult your caregiver.  Hepatitis A immunization.** / Consult your caregiver. 2 doses, 6 to 18 months apart.  Hepatitis B immunization.** / Consult your caregiver. 3 doses, usually over 6 months. Ages 65 and over  Blood pressure check.** / Every 1 to 2 years.  Lipid and cholesterol check.** / Every 5 years beginning at age 20.  Clinical breast exam.** / Every year after age 40.  Mammogram.** / Every year beginning at age 40 and continuing for as long as you are in good health. Consult with your caregiver.  Pap test.** / Every 3 years starting at age 30 through age 65 or 70 with a 3  consecutive normal Pap tests. Testing can be stopped between 65 and 70 with 3 consecutive normal Pap tests and no abnormal Pap or HPV tests in the past 10 years.  HPV screening.** / Every 3 years from ages 30 through ages 65 or 70 with a history of 3 consecutive normal Pap tests. Testing can be stopped between 65 and 70 with 3 consecutive normal Pap tests and no abnormal Pap or HPV tests in the past 10 years.  Fecal occult blood test (FOBT) of stool. / Every year beginning at age 50 and continuing until age 75. You may not need to do this test if you get a colonoscopy every 10 years.  Flexible sigmoidoscopy or colonoscopy.** / Every 5 years for a flexible sigmoidoscopy or every 10 years for a colonoscopy beginning at age 50 and continuing until age 75.  Hepatitis C blood test.** / For all people born from 1945 through 1965 and any individual with known risks for hepatitis C.  Osteoporosis screening.** / A one-time screening for women ages 65 and over and women at risk for fractures or osteoporosis.  Skin self-exam. / Monthly.  Influenza immunization.** / Every year.  Pneumococcal polysaccharide immunization.** / 1 dose at age 65 (or older) if you have never been vaccinated.  Tetanus, diphtheria, pertussis (Tdap, Td) immunization. / A one-time dose of Tdap vaccine if you are over   65 and have contact with an infant, are a healthcare worker, or simply want to be protected from whooping cough. After that, you need a Td booster dose every 10 years.  Varicella immunization.** / Consult your caregiver.  Meningococcal immunization.** / Consult your caregiver.  Hepatitis A immunization.** / Consult your caregiver. 2 doses, 6 to 18 months apart.  Hepatitis B immunization.** / Check with your caregiver. 3 doses, usually over 6 months. ** Family history and personal history of risk and conditions may change your caregiver's recommendations. Document Released: 04/14/2001 Document Revised: 05/11/2011  Document Reviewed: 07/14/2010 [employer] Patient Information 2014 [employer].  

## 2012-07-28 DIAGNOSIS — R0602 Shortness of breath: Secondary | ICD-10-CM | POA: Insufficient documentation

## 2012-07-28 NOTE — Assessment & Plan Note (Signed)
Check labs Cont' meds 

## 2012-07-28 NOTE — Assessment & Plan Note (Signed)
With O2 desat with walking--- pt refusing any workup except echo She will call or rto if more symptoms develop

## 2012-07-28 NOTE — Assessment & Plan Note (Signed)
Cont meds stable 

## 2012-07-28 NOTE — Assessment & Plan Note (Signed)
Check labs con't meds 

## 2012-08-01 ENCOUNTER — Ambulatory Visit (HOSPITAL_COMMUNITY)
Admission: RE | Admit: 2012-08-01 | Discharge: 2012-08-01 | Disposition: A | Discharge status: Home / Self Care | Payer: Medicare Other | Source: Ambulatory Visit | Attending: Family Medicine | Admitting: Family Medicine

## 2012-08-01 DIAGNOSIS — R011 Cardiac murmur, unspecified: Secondary | ICD-10-CM | POA: Insufficient documentation

## 2012-08-01 DIAGNOSIS — I10 Essential (primary) hypertension: Secondary | ICD-10-CM | POA: Insufficient documentation

## 2012-08-01 DIAGNOSIS — I080 Rheumatic disorders of both mitral and aortic valves: Secondary | ICD-10-CM | POA: Insufficient documentation

## 2012-08-01 DIAGNOSIS — I359 Nonrheumatic aortic valve disorder, unspecified: Secondary | ICD-10-CM

## 2012-08-01 DIAGNOSIS — R0602 Shortness of breath: Secondary | ICD-10-CM

## 2012-08-01 NOTE — Progress Notes (Signed)
Echocardiogram 2D Echocardiogram has been performed.  Richie Vadala 08/01/2012, 3:48 PM

## 2012-08-25 ENCOUNTER — Other Ambulatory Visit: Payer: Self-pay | Admitting: *Deleted

## 2012-08-25 MED ORDER — ATENOLOL 25 MG PO TABS: 25.0000 mg | ORAL_TABLET | Freq: Every day | ORAL | Status: DC

## 2012-08-25 MED ORDER — ATORVASTATIN CALCIUM 10 MG PO TABS: ORAL_TABLET | ORAL | Status: DC

## 2012-08-25 MED ORDER — FENOFIBRATE 145 MG PO TABS: ORAL_TABLET | ORAL | Status: DC

## 2012-08-25 NOTE — Telephone Encounter (Signed)
Rx sent, protonix last filled 05-02-12 #90 1 Pt should still have Rx on file.

## 2012-08-25 NOTE — Addendum Note (Signed)
Addended by: Verdene Rio on: 08/25/2012 09:33 AM   Modules accepted: Orders

## 2012-11-02 ENCOUNTER — Telehealth: Payer: Self-pay | Admitting: *Deleted

## 2012-11-02 MED ORDER — GABAPENTIN 600 MG PO TABS: 600.0000 mg | ORAL_TABLET | Freq: Every day | ORAL | Status: DC

## 2012-11-02 NOTE — Telephone Encounter (Signed)
No uds needed for gabepentin---refill 6 months

## 2012-11-02 NOTE — Telephone Encounter (Signed)
Refill request for Gabapentin 600 mg  Last ov 07/27/12 Last date filled 07/27/12 #90 0 R No UDS  No Contract   Please advise   Ag cma

## 2012-11-22 ENCOUNTER — Telehealth: Payer: Self-pay | Admitting: Family Medicine

## 2012-11-22 MED ORDER — PANTOPRAZOLE SODIUM 40 MG PO TBEC: 40.0000 mg | DELAYED_RELEASE_TABLET | Freq: Every day | ORAL | Status: DC

## 2012-11-22 NOTE — Telephone Encounter (Signed)
Refill: Pantoprazole tab 40 mg. Take 1 by mouth daily. 90 day supply

## 2013-01-27 ENCOUNTER — Ambulatory Visit (INDEPENDENT_AMBULATORY_CARE_PROVIDER_SITE_OTHER): Payer: Medicare Other | Admitting: Family Medicine

## 2013-01-27 ENCOUNTER — Encounter: Payer: Self-pay | Admitting: Family Medicine

## 2013-01-27 VITALS — BP 122/60 | HR 95 | Temp 98.3°F | Wt 229.0 lb

## 2013-01-27 DIAGNOSIS — R32 Unspecified urinary incontinence: Secondary | ICD-10-CM

## 2013-01-27 DIAGNOSIS — D229 Melanocytic nevi, unspecified: Secondary | ICD-10-CM

## 2013-01-27 DIAGNOSIS — E039 Hypothyroidism, unspecified: Secondary | ICD-10-CM

## 2013-01-27 DIAGNOSIS — K219 Gastro-esophageal reflux disease without esophagitis: Secondary | ICD-10-CM

## 2013-01-27 DIAGNOSIS — I6350 Cerebral infarction due to unspecified occlusion or stenosis of unspecified cerebral artery: Secondary | ICD-10-CM

## 2013-01-27 DIAGNOSIS — N390 Urinary tract infection, site not specified: Secondary | ICD-10-CM

## 2013-01-27 DIAGNOSIS — E785 Hyperlipidemia, unspecified: Secondary | ICD-10-CM

## 2013-01-27 DIAGNOSIS — D492 Neoplasm of unspecified behavior of bone, soft tissue, and skin: Secondary | ICD-10-CM

## 2013-01-27 DIAGNOSIS — I10 Essential (primary) hypertension: Secondary | ICD-10-CM

## 2013-01-27 DIAGNOSIS — I639 Cerebral infarction, unspecified: Secondary | ICD-10-CM

## 2013-01-27 DIAGNOSIS — F329 Major depressive disorder, single episode, unspecified: Secondary | ICD-10-CM

## 2013-01-27 DIAGNOSIS — R52 Pain, unspecified: Secondary | ICD-10-CM

## 2013-01-27 DIAGNOSIS — Z0000 Encounter for general adult medical examination without abnormal findings: Secondary | ICD-10-CM

## 2013-01-27 DIAGNOSIS — F32A Depression, unspecified: Secondary | ICD-10-CM

## 2013-01-27 LAB — CBC WITH DIFFERENTIAL/PLATELET
Basophils Absolute: 0 10*3/uL (ref 0.0–0.1)
Hemoglobin: 12.6 g/dL (ref 12.0–15.0)
Lymphocytes Relative: 26.1 % (ref 12.0–46.0)
Lymphs Abs: 1.2 10*3/uL (ref 0.7–4.0)
MCV: 99.7 fl (ref 78.0–100.0)
Monocytes Absolute: 0.4 10*3/uL (ref 0.1–1.0)
Monocytes Relative: 10 % (ref 3.0–12.0)
Neutro Abs: 2.7 10*3/uL (ref 1.4–7.7)
RBC: 3.77 Mil/uL — ABNORMAL LOW (ref 3.87–5.11)
RDW: 14.2 % (ref 11.5–14.6)
WBC: 4.5 10*3/uL (ref 4.5–10.5)

## 2013-01-27 LAB — BASIC METABOLIC PANEL
Calcium: 9.4 mg/dL (ref 8.4–10.5)
GFR: 67.11 mL/min (ref >60.00)
Sodium: 139 mEq/L (ref 135–145)

## 2013-01-27 LAB — POCT URINALYSIS DIPSTICK
Bilirubin, UA: NEGATIVE
Nitrite, UA: NEGATIVE
Protein, UA: NEGATIVE
Spec Grav, UA: 1.01
Urobilinogen, UA: 0.2
pH, UA: 7.5

## 2013-01-27 LAB — HEPATIC FUNCTION PANEL
AST: 22 U/L (ref 0–37)
Albumin: 4 g/dL (ref 3.5–5.2)
Alkaline Phosphatase: 34 U/L — ABNORMAL LOW (ref 39–117)

## 2013-01-27 LAB — LIPID PANEL
HDL: 40.5 mg/dL (ref >39.00)
LDL Cholesterol: 53 mg/dL (ref 0–99)
Triglycerides: 123 mg/dL (ref 0.0–149.0)
VLDL: 24.6 mg/dL (ref 0.0–40.0)

## 2013-01-27 LAB — TSH: TSH: 3.36 u[IU]/mL (ref 0.35–5.50)

## 2013-01-27 MED ORDER — ATENOLOL 25 MG PO TABS: 25.0000 mg | ORAL_TABLET | Freq: Every day | ORAL | Status: DC

## 2013-01-27 MED ORDER — TOLTERODINE TARTRATE ER 4 MG PO CP24: 4.0000 mg | ORAL_CAPSULE | Freq: Every day | ORAL | Status: DC

## 2013-01-27 MED ORDER — FENOFIBRATE 145 MG PO TABS: ORAL_TABLET | ORAL | Status: DC

## 2013-01-27 MED ORDER — CLOPIDOGREL BISULFATE 75 MG PO TABS: ORAL_TABLET | ORAL | Status: DC

## 2013-01-27 MED ORDER — VALSARTAN-HYDROCHLOROTHIAZIDE 160-25 MG PO TABS: 1.0000 | ORAL_TABLET | Freq: Every day | ORAL | Status: DC

## 2013-01-27 MED ORDER — LEVOTHYROXINE SODIUM 112 MCG PO TABS: 112.0000 ug | ORAL_TABLET | Freq: Every day | ORAL | Status: DC

## 2013-01-27 MED ORDER — ASPIRIN EC 81 MG PO TBEC: 81.0000 mg | DELAYED_RELEASE_TABLET | Freq: Every day | ORAL | Status: DC

## 2013-01-27 MED ORDER — GABAPENTIN 600 MG PO TABS: 600.0000 mg | ORAL_TABLET | Freq: Every day | ORAL | Status: DC

## 2013-01-27 MED ORDER — PANTOPRAZOLE SODIUM 40 MG PO TBEC: 40.0000 mg | DELAYED_RELEASE_TABLET | Freq: Every day | ORAL | Status: DC

## 2013-01-27 MED ORDER — TRAMADOL HCL 50 MG PO TABS: 50.0000 mg | ORAL_TABLET | Freq: Four times a day (QID) | ORAL | Status: DC | PRN

## 2013-01-27 MED ORDER — EZETIMIBE-SIMVASTATIN 10-20 MG PO TABS: 1.0000 | ORAL_TABLET | Freq: Every day | ORAL | Status: DC

## 2013-01-27 MED ORDER — POTASSIUM CHLORIDE CRYS ER 20 MEQ PO TBCR: 20.0000 meq | EXTENDED_RELEASE_TABLET | Freq: Every day | ORAL | Status: DC

## 2013-01-27 MED ORDER — ATORVASTATIN CALCIUM 10 MG PO TABS: ORAL_TABLET | ORAL | Status: DC

## 2013-01-27 MED ORDER — SERTRALINE HCL 50 MG PO TABS: 50.0000 mg | ORAL_TABLET | Freq: Every day | ORAL | Status: DC

## 2013-01-27 NOTE — Assessment & Plan Note (Signed)
Check labs con't meds 

## 2013-01-27 NOTE — Progress Notes (Signed)
Pre visit review using our clinic review tool, if applicable. No additional management support is needed unless otherwise documented below in the visit note. 

## 2013-01-27 NOTE — Patient Instructions (Signed)

## 2013-01-27 NOTE — Addendum Note (Signed)
Addended by: Silvio Pate D on: 01/27/2013 03:16 PM   Modules accepted: Orders

## 2013-01-27 NOTE — Progress Notes (Signed)
  Subjective:    Patient here for follow-up of elevated blood pressure.  She is not exercising and is adherent to a low-salt diet.  Blood pressure is well controlled at home. Cardiac symptoms: none. Patient denies: chest pain, chest pressure/discomfort, claudication, dyspnea, exertional chest pressure/discomfort, irregular heart beat, lower extremity edema, near-syncope, orthopnea, palpitations, paroxysmal nocturnal dyspnea, syncope and tachypnea. Cardiovascular risk factors: advanced age (older than 33 for men, 60 for women), dyslipidemia, hypertension, obesity (BMI >= 30 kg/m2) and sedentary lifestyle. Use of agents associated with hypertension: none. History of target organ damage: none.  The following portions of the patient's history were reviewed and updated as appropriate: allergies, current medications, past family history, past medical history, past social history, past surgical history and problem list.  Review of Systems Pertinent items are noted in HPI.     Objective:    BP 122/60  Pulse 95  Temp(Src) 98.3 F (36.8 C) (Tympanic)  Wt 229 lb (103.874 kg)  SpO2 98% General appearance: alert, cooperative, appears stated age and no distress Neck: no adenopathy, no carotid bruit, no JVD, supple, symmetrical, trachea midline and thyroid not enlarged, symmetric, no tenderness/mass/nodules Lungs: clear to auscultation bilaterally Heart: S1, S2 normal   + murmur Extremities: edema tr pitting edema b/l low ext    Assessment:    Hypertension, normal blood pressure . Evidence of target organ damage: none.    Plan:    Medication: no change. Dietary sodium restriction. Follow up: 6 months and as needed.

## 2013-01-27 NOTE — Assessment & Plan Note (Signed)
Stable , con't meds  

## 2013-01-30 LAB — URINE CULTURE: Colony Count: >=100000

## 2013-01-31 ENCOUNTER — Other Ambulatory Visit: Payer: Self-pay

## 2013-01-31 MED ORDER — CIPROFLOXACIN HCL 500 MG PO TABS: 500.0000 mg | ORAL_TABLET | Freq: Two times a day (BID) | ORAL | Status: DC

## 2013-02-01 ENCOUNTER — Other Ambulatory Visit: Payer: Self-pay | Admitting: *Deleted

## 2013-02-01 DIAGNOSIS — N390 Urinary tract infection, site not specified: Secondary | ICD-10-CM

## 2013-02-01 MED ORDER — CIPROFLOXACIN HCL 500 MG PO TABS: 500.0000 mg | ORAL_TABLET | Freq: Two times a day (BID) | ORAL | Status: DC

## 2013-02-07 ENCOUNTER — Other Ambulatory Visit: Payer: Self-pay | Admitting: Family Medicine

## 2013-02-14 ENCOUNTER — Encounter: Payer: Self-pay | Admitting: Lab

## 2013-02-14 ENCOUNTER — Other Ambulatory Visit (INDEPENDENT_AMBULATORY_CARE_PROVIDER_SITE_OTHER): Payer: Medicare Other

## 2013-02-14 DIAGNOSIS — N390 Urinary tract infection, site not specified: Secondary | ICD-10-CM

## 2013-02-14 LAB — POCT URINALYSIS DIPSTICK
Bilirubin, UA: NEGATIVE
Blood, UA: NEGATIVE
Ketones, UA: NEGATIVE
Spec Grav, UA: <=1.005
pH, UA: 7.5

## 2013-06-19 ENCOUNTER — Other Ambulatory Visit: Payer: Self-pay

## 2013-06-19 DIAGNOSIS — E785 Hyperlipidemia, unspecified: Secondary | ICD-10-CM

## 2013-06-19 DIAGNOSIS — I10 Essential (primary) hypertension: Secondary | ICD-10-CM

## 2013-06-19 MED ORDER — ATENOLOL 25 MG PO TABS: 25.0000 mg | ORAL_TABLET | Freq: Every day | ORAL | Status: DC

## 2013-06-19 MED ORDER — ATORVASTATIN CALCIUM 10 MG PO TABS: ORAL_TABLET | ORAL | Status: DC

## 2013-06-19 MED ORDER — POTASSIUM CHLORIDE CRYS ER 20 MEQ PO TBCR
20.0000 meq | EXTENDED_RELEASE_TABLET | Freq: Every day | ORAL | Status: DC
Start: 2013-06-19 — End: 2013-10-24

## 2013-06-19 MED ORDER — FENOFIBRATE 145 MG PO TABS: ORAL_TABLET | ORAL | Status: DC

## 2013-06-19 NOTE — Telephone Encounter (Signed)
Medication sent.      KP

## 2013-06-20 ENCOUNTER — Other Ambulatory Visit: Payer: Self-pay | Admitting: Dermatology

## 2013-07-20 ENCOUNTER — Encounter (HOSPITAL_COMMUNITY): Payer: Self-pay | Admitting: Emergency Medicine

## 2013-07-20 ENCOUNTER — Inpatient Hospital Stay (HOSPITAL_COMMUNITY)
Admission: EM | Admit: 2013-07-20 | Discharge: 2013-07-22 | DRG: 189 | Disposition: A | Discharge status: Home / Self Care | Payer: Medicare Other | Attending: Family Medicine | Admitting: Family Medicine

## 2013-07-20 ENCOUNTER — Emergency Department (HOSPITAL_COMMUNITY): Payer: Medicare Other

## 2013-07-20 ENCOUNTER — Telehealth: Payer: Self-pay

## 2013-07-20 DIAGNOSIS — Z87891 Personal history of nicotine dependence: Secondary | ICD-10-CM

## 2013-07-20 DIAGNOSIS — M949 Disorder of cartilage, unspecified: Secondary | ICD-10-CM

## 2013-07-20 DIAGNOSIS — F329 Major depressive disorder, single episode, unspecified: Secondary | ICD-10-CM | POA: Diagnosis present

## 2013-07-20 DIAGNOSIS — R413 Other amnesia: Secondary | ICD-10-CM

## 2013-07-20 DIAGNOSIS — E538 Deficiency of other specified B group vitamins: Secondary | ICD-10-CM

## 2013-07-20 DIAGNOSIS — R062 Wheezing: Secondary | ICD-10-CM

## 2013-07-20 DIAGNOSIS — R209 Unspecified disturbances of skin sensation: Secondary | ICD-10-CM

## 2013-07-20 DIAGNOSIS — H539 Unspecified visual disturbance: Secondary | ICD-10-CM

## 2013-07-20 DIAGNOSIS — J438 Other emphysema: Secondary | ICD-10-CM | POA: Diagnosis present

## 2013-07-20 DIAGNOSIS — J40 Bronchitis, not specified as acute or chronic: Secondary | ICD-10-CM

## 2013-07-20 DIAGNOSIS — J9600 Acute respiratory failure, unspecified whether with hypoxia or hypercapnia: Principal | ICD-10-CM | POA: Diagnosis present

## 2013-07-20 DIAGNOSIS — R0989 Other specified symptoms and signs involving the circulatory and respiratory systems: Secondary | ICD-10-CM

## 2013-07-20 DIAGNOSIS — Z6835 Body mass index (BMI) 35.0-35.9, adult: Secondary | ICD-10-CM

## 2013-07-20 DIAGNOSIS — R269 Unspecified abnormalities of gait and mobility: Secondary | ICD-10-CM

## 2013-07-20 DIAGNOSIS — R609 Edema, unspecified: Secondary | ICD-10-CM | POA: Diagnosis present

## 2013-07-20 DIAGNOSIS — S0100XA Unspecified open wound of scalp, initial encounter: Secondary | ICD-10-CM

## 2013-07-20 DIAGNOSIS — E039 Hypothyroidism, unspecified: Secondary | ICD-10-CM | POA: Diagnosis present

## 2013-07-20 DIAGNOSIS — I10 Essential (primary) hypertension: Secondary | ICD-10-CM | POA: Diagnosis present

## 2013-07-20 DIAGNOSIS — R0609 Other forms of dyspnea: Secondary | ICD-10-CM

## 2013-07-20 DIAGNOSIS — J069 Acute upper respiratory infection, unspecified: Secondary | ICD-10-CM

## 2013-07-20 DIAGNOSIS — R0902 Hypoxemia: Secondary | ICD-10-CM

## 2013-07-20 DIAGNOSIS — Z7982 Long term (current) use of aspirin: Secondary | ICD-10-CM

## 2013-07-20 DIAGNOSIS — I509 Heart failure, unspecified: Secondary | ICD-10-CM

## 2013-07-20 DIAGNOSIS — R0602 Shortness of breath: Secondary | ICD-10-CM

## 2013-07-20 DIAGNOSIS — T502X5A Adverse effect of carbonic-anhydrase inhibitors, benzothiadiazides and other diuretics, initial encounter: Secondary | ICD-10-CM | POA: Diagnosis not present

## 2013-07-20 DIAGNOSIS — E785 Hyperlipidemia, unspecified: Secondary | ICD-10-CM | POA: Diagnosis present

## 2013-07-20 DIAGNOSIS — M25569 Pain in unspecified knee: Secondary | ICD-10-CM

## 2013-07-20 DIAGNOSIS — N179 Acute kidney failure, unspecified: Secondary | ICD-10-CM | POA: Diagnosis not present

## 2013-07-20 DIAGNOSIS — N390 Urinary tract infection, site not specified: Secondary | ICD-10-CM

## 2013-07-20 DIAGNOSIS — R42 Dizziness and giddiness: Secondary | ICD-10-CM | POA: Diagnosis present

## 2013-07-20 DIAGNOSIS — Z79899 Other long term (current) drug therapy: Secondary | ICD-10-CM

## 2013-07-20 DIAGNOSIS — F3289 Other specified depressive episodes: Secondary | ICD-10-CM | POA: Diagnosis present

## 2013-07-20 DIAGNOSIS — H538 Other visual disturbances: Secondary | ICD-10-CM | POA: Diagnosis present

## 2013-07-20 DIAGNOSIS — M899 Disorder of bone, unspecified: Secondary | ICD-10-CM

## 2013-07-20 DIAGNOSIS — R55 Syncope and collapse: Secondary | ICD-10-CM | POA: Diagnosis present

## 2013-07-20 LAB — BASIC METABOLIC PANEL
BUN: 21 mg/dL (ref 6–23)
CALCIUM: 9.7 mg/dL (ref 8.4–10.5)
CO2: 27 mEq/L (ref 19–32)
Chloride: 101 mEq/L (ref 96–112)
Creatinine, Ser: 1.12 mg/dL — ABNORMAL HIGH (ref 0.50–1.10)
GFR, EST AFRICAN AMERICAN: 49 mL/min — AB (ref >90)
GFR, EST NON AFRICAN AMERICAN: 43 mL/min — AB (ref >90)
GLUCOSE: 114 mg/dL — AB (ref 70–99)
POTASSIUM: 4.3 meq/L (ref 3.7–5.3)
SODIUM: 140 meq/L (ref 137–147)

## 2013-07-20 LAB — BLOOD GAS, ARTERIAL
ACID-BASE EXCESS: 1 mmol/L (ref 0.0–2.0)
Bicarbonate: 26.1 mEq/L — ABNORMAL HIGH (ref 20.0–24.0)
DRAWN BY: 257701
FIO2: 0.21 %
O2 SAT: 96.2 %
PCO2 ART: 46.6 mmHg — AB (ref 35.0–45.0)
Patient temperature: 98.6
TCO2: 23.9 mmol/L (ref 0–100)
pH, Arterial: 7.368 (ref 7.350–7.450)
pO2, Arterial: 87 mmHg (ref 80.0–100.0)

## 2013-07-20 LAB — CBC
HCT: 36.8 % (ref 36.0–46.0)
Hemoglobin: 12.2 g/dL (ref 12.0–15.0)
MCH: 33.8 pg (ref 26.0–34.0)
MCHC: 33.2 g/dL (ref 30.0–36.0)
MCV: 101.9 fL — ABNORMAL HIGH (ref 78.0–100.0)
Platelets: 164 10*3/uL (ref 150–400)
RBC: 3.61 MIL/uL — ABNORMAL LOW (ref 3.87–5.11)
RDW: 12.7 % (ref 11.5–15.5)
WBC: 5.1 10*3/uL (ref 4.0–10.5)

## 2013-07-20 LAB — PRO B NATRIURETIC PEPTIDE: PRO B NATRI PEPTIDE: 982.2 pg/mL — AB (ref 0–450)

## 2013-07-20 LAB — I-STAT TROPONIN, ED: Troponin i, poc: 0 ng/mL (ref 0.00–0.08)

## 2013-07-20 MED ORDER — GABAPENTIN 300 MG PO CAPS
600.0000 mg | ORAL_CAPSULE | Freq: Every day | ORAL | Status: DC
  Administered 2013-07-21 – 2013-07-22 (×2): 600 mg via ORAL
  Filled 2013-07-20 (×2): qty 2

## 2013-07-20 MED ORDER — POTASSIUM CHLORIDE CRYS ER 20 MEQ PO TBCR
20.0000 meq | EXTENDED_RELEASE_TABLET | Freq: Every day | ORAL | Status: DC
  Administered 2013-07-21 – 2013-07-22 (×2): 20 meq via ORAL
  Filled 2013-07-20 (×2): qty 1

## 2013-07-20 MED ORDER — ACETAMINOPHEN 325 MG PO TABS: 650.0000 mg | ORAL_TABLET | Freq: Four times a day (QID) | ORAL | Status: DC | PRN

## 2013-07-20 MED ORDER — EZETIMIBE-SIMVASTATIN 10-20 MG PO TABS
1.0000 | ORAL_TABLET | Freq: Every evening | ORAL | Status: DC
  Administered 2013-07-21: 1 via ORAL
  Filled 2013-07-20 (×2): qty 1

## 2013-07-20 MED ORDER — ONDANSETRON HCL 4 MG/2ML IJ SOLN
4.0000 mg | Freq: Four times a day (QID) | INTRAMUSCULAR | Status: DC | PRN
Start: 2013-07-20 — End: 2013-07-22

## 2013-07-20 MED ORDER — SODIUM CHLORIDE 0.9 % IJ SOLN
3.0000 mL | Freq: Two times a day (BID) | INTRAMUSCULAR | Status: DC
  Administered 2013-07-20 – 2013-07-21 (×3): 3 mL via INTRAVENOUS

## 2013-07-20 MED ORDER — FUROSEMIDE 10 MG/ML IJ SOLN
40.0000 mg | Freq: Once | INTRAMUSCULAR | Status: AC
  Administered 2013-07-20: 40 mg via INTRAVENOUS
  Filled 2013-07-20: qty 4

## 2013-07-20 MED ORDER — HYDROCHLOROTHIAZIDE 25 MG PO TABS
25.0000 mg | ORAL_TABLET | Freq: Every day | ORAL | Status: DC
  Administered 2013-07-21: 25 mg via ORAL
  Filled 2013-07-20: qty 1

## 2013-07-20 MED ORDER — BETAXOLOL HCL 0.25 % OP SUSP
1.0000 [drp] | Freq: Two times a day (BID) | OPHTHALMIC | Status: DC
  Administered 2013-07-20 – 2013-07-22 (×4): 1 [drp] via OPHTHALMIC
  Filled 2013-07-20: qty 10

## 2013-07-20 MED ORDER — ATENOLOL 25 MG PO TABS
25.0000 mg | ORAL_TABLET | Freq: Every day | ORAL | Status: DC
  Administered 2013-07-21 – 2013-07-22 (×2): 25 mg via ORAL
  Filled 2013-07-20 (×2): qty 1

## 2013-07-20 MED ORDER — ONDANSETRON HCL 4 MG PO TABS
4.0000 mg | ORAL_TABLET | Freq: Four times a day (QID) | ORAL | Status: DC | PRN
Start: 2013-07-20 — End: 2013-07-22

## 2013-07-20 MED ORDER — PANTOPRAZOLE SODIUM 40 MG PO TBEC
40.0000 mg | DELAYED_RELEASE_TABLET | Freq: Every day | ORAL | Status: DC
  Administered 2013-07-21 – 2013-07-22 (×2): 40 mg via ORAL
  Filled 2013-07-20 (×2): qty 1

## 2013-07-20 MED ORDER — ENOXAPARIN SODIUM 40 MG/0.4ML ~~LOC~~ SOLN
40.0000 mg | Freq: Every day | SUBCUTANEOUS | Status: DC
  Administered 2013-07-20 – 2013-07-21 (×2): 40 mg via SUBCUTANEOUS
  Filled 2013-07-20 (×3): qty 0.4

## 2013-07-20 MED ORDER — CLOPIDOGREL BISULFATE 75 MG PO TABS
75.0000 mg | ORAL_TABLET | Freq: Every day | ORAL | Status: DC
  Administered 2013-07-21 – 2013-07-22 (×2): 75 mg via ORAL
  Filled 2013-07-20 (×3): qty 1

## 2013-07-20 MED ORDER — IOHEXOL 350 MG/ML SOLN
100.0000 mL | Freq: Once | INTRAVENOUS | Status: AC | PRN
  Administered 2013-07-20: 100 mL via INTRAVENOUS

## 2013-07-20 MED ORDER — FESOTERODINE FUMARATE ER 8 MG PO TB24
8.0000 mg | ORAL_TABLET | Freq: Every day | ORAL | Status: DC
  Administered 2013-07-21 – 2013-07-22 (×2): 8 mg via ORAL
  Filled 2013-07-20 (×4): qty 1

## 2013-07-20 MED ORDER — IRBESARTAN 150 MG PO TABS
150.0000 mg | ORAL_TABLET | Freq: Every day | ORAL | Status: DC
  Administered 2013-07-21 – 2013-07-22 (×2): 150 mg via ORAL
  Filled 2013-07-20 (×2): qty 1

## 2013-07-20 MED ORDER — ACETAMINOPHEN 650 MG RE SUPP: 650.0000 mg | Freq: Four times a day (QID) | RECTAL | Status: DC | PRN

## 2013-07-20 MED ORDER — LEVOTHYROXINE SODIUM 112 MCG PO TABS
112.0000 ug | ORAL_TABLET | Freq: Every day | ORAL | Status: DC
  Administered 2013-07-21 – 2013-07-22 (×2): 112 ug via ORAL
  Filled 2013-07-20 (×3): qty 1

## 2013-07-20 MED ORDER — SERTRALINE HCL 50 MG PO TABS
50.0000 mg | ORAL_TABLET | Freq: Every day | ORAL | Status: DC
  Administered 2013-07-21 – 2013-07-22 (×2): 50 mg via ORAL
  Filled 2013-07-20 (×2): qty 1

## 2013-07-20 MED ORDER — VALSARTAN-HYDROCHLOROTHIAZIDE 160-25 MG PO TABS: 1.0000 | ORAL_TABLET | Freq: Every day | ORAL | Status: DC

## 2013-07-20 NOTE — ED Notes (Signed)
Pt. Oxygen level 95% on room air. Pt. Oxygen level decreased to 87%, RN, Luz Lex made aware and pt. Placed on 2 liters oxygen.

## 2013-07-20 NOTE — Telephone Encounter (Signed)
agree

## 2013-07-20 NOTE — H&P (Addendum)
History and Physical  Kirsten Flores VZD:[id number] DOB: 09-06-1923 DOA: 07/20/2013   PCP: Garnet Koyanagi, DO   Chief Complaint: Lightheadedness and shortness of breath  HPI:  80-year-old female with a history of hypertension, hyperlipidemia, hypothyroidism, B12 deficiency presents with one-day history of lightheadedness and near syncope as she was getting up from the commode this morning. She stated that it persisted for 3-4 hours. As a result should call her son and she was brought to the hospital by private vehicle. The patient has  also been complaining of shortness of breath which she states "comes and goes" for a couple years. She denies any fevers, chills, chest pain, nausea, vomiting, diarrhea, abdominal pain, dysuria, hematuria. She denies any orthopnea, PND, significant weight gain. Patient has chronic bilateral lower extremity edema which she states is unchanged over the last several months. She states that most of her shortness of breath comes with exertion although she states that this has not been any worse than usual. She has remote tobacco history, quit 40 years ago. Regarding her dizziness, patient states that this was accompanied by some blurry vision for 3-4 hours. She denies any focal tremor weakness, syncope, dysarthria, any other dysesthesias. The patient denies any new medications. She denies any dysuria, hematuria, abdominal pain, hematochezia, melena. In the emergency department, the patient received furosemide 40 mg IV x1. Her vitals were stable. EKG shows sinus rhythm with poor R-wave progression. BMP was unremarkable. CBC was unremarkable. BMP was 982. On the patient and family, the patient's oxygenation decreased to 89% Assessment/Plan:  Dizziness -This has improved in the emergency department -Concerned about TIA/stroke -MRI brain -Orthostatic vital signs -TSH -Urinalysis Dyspnea on exertion/hypoxemia -ABG shows 7.36/46/87/26 on room air -Etiology unclear  presently although suspect underlying COPD, question pulm HTN/R-heart failure -CT angiogram chest was negative for pulmonary embolus, only showed some scarring without pulmonary edema or infiltrates -Chest x-ray showed upper lobe scarring  -Cycle troponins -Echocardiogram -Presently stable with a 95-96% saturation on 1 L Hypertension -Continue atenolol Hypothyroidism -Continue Synthroid -Check TSH Depression -Continue Synthroid Hypertension -Continue ARB/HCTZ LE edema/pain -duplex legs r/o DVT     Past Medical History  Diagnosis Date  . Hyperlipidemia   . Hypertension   . Osteopenia   . Thyroid disease   . B12 deficiency   . Arthritis   . Memory loss   . Knee pain     RIGHT  . Gait disturbance    Past Surgical History  Procedure Laterality Date  . Appendectomy    . Cholecystectomy    . Abdominal hysterectomy    . Oophorectomy     Social History:  reports that she has quit smoking. She has never used smokeless tobacco. She reports that she drinks alcohol. She reports that she does not use illicit drugs.   Family History  Problem Relation Age of Onset  . Colon cancer    . Melanoma    . Cancer Other     COLON...1ST DEGREE RELATIVE     Allergies  Allergen Reactions  . Diltiazem Hcl Other (See Comments)    Unknown reaction  . Neomycin Other (See Comments)    Unknown reaction (a long time ago)      Prior to Admission medications   Medication Sig Start Date End Date Taking? Authorizing Provider  acetaminophen (TYLENOL ARTHRITIS PAIN) 650 MG CR tablet Take 1,300 mg by mouth 2 (two) times daily.   Yes Historical Provider, MD  atenolol (TENORMIN) 25 MG tablet Take  1 tablet (25 mg total) by mouth daily. 06/19/13  Yes Yvonne R Lowne, DO  betaxolol (BETOPTIC-S) 0.25 % ophthalmic suspension Place 1 drop into both eyes 2 (two) times daily.     Yes Historical Provider, MD  clopidogrel (PLAVIX) 75 MG tablet Take 75 mg by mouth daily with breakfast.   Yes Historical  Provider, MD  ezetimibe-simvastatin (VYTORIN) 10-20 MG per tablet Take 1 tablet by mouth every evening.   Yes Historical Provider, MD  gabapentin (NEURONTIN) 600 MG tablet Take 1 tablet (600 mg total) by mouth daily. 01/27/13  Yes Rosalita Chessman, DO  levothyroxine (SYNTHROID, LEVOTHROID) 112 MCG tablet Take 1 tablet (112 mcg total) by mouth daily. 01/27/13  Yes Yvonne R Lowne, DO  pantoprazole (PROTONIX) 40 MG tablet Take 1 tablet (40 mg total) by mouth daily. 01/27/13  Yes Yvonne R Lowne, DO  potassium chloride SA (K-DUR,KLOR-CON) 20 MEQ tablet Take 1 tablet (20 mEq total) by mouth daily. 06/19/13  Yes Yvonne R Lowne, DO  sertraline (ZOLOFT) 50 MG tablet Take 1 tablet (50 mg total) by mouth daily. 01/27/13  Yes Rosalita Chessman, DO  tolterodine (DETROL LA) 4 MG 24 hr capsule Take 1 capsule (4 mg total) by mouth daily. 01/27/13  Yes Yvonne R Lowne, DO  valsartan-hydrochlorothiazide (DIOVAN-HCT) 160-25 MG per tablet Take 1 tablet by mouth daily. 01/27/13  Yes Rosalita Chessman, DO    Review of Systems:  Constitutional:  No weight loss, night sweats, Fevers, chills, Head&Eyes: No headache.  No vision loss.  No eye pain or scotoma ENT:  No Difficulty swallowing,Tooth/dental problems,Sore throat,   Cardio-vascular:  No chest pain, Orthopnea, PND, swelling in lower extremities, GI:  No  abdominal pain, nausea, vomiting, diarrhea, loss of appetite, hematochezia, melena, heartburn, indigestion, Resp:  No shortness of breath with exertion or at rest. No cough. No coughing up of blood .No wheezing.No chest wall deformity  Skin:  no rash or lesions.  GU:  no dysuria, change in color of urine, no urgency or frequency. No flank pain.  Musculoskeletal:  No joint pain or swelling. No decreased range of motion. No back pain.  Psych:  No change in mood or affect. No depression or anxiety. Neurologic: No headache, no dysesthesia, no focal weakness, no vision loss. No syncope  Physical Exam: Filed  Vitals:   07/20/13 1626 07/20/13 2042  BP: 137/49 163/54  Pulse:  75  Temp: 98.4 F (36.9 C)   TempSrc: Oral   Resp: 20 20  SpO2: 89% 95%   General:  A&O x 3, NAD, nontoxic, pleasant/cooperative Head/Eye: No conjunctival hemorrhage, no icterus, K[place]/AT, No nystagmus ENT:  No icterus,  No thrush, edentulous, no pharyngeal exudate Neck:  No masses, no lymphadenpathy, no bruits CV:  RRR, no rub, no gallop, no S3 Lung:  Bibasilar crackles. No wheezing. Air movement.  Abdomen: soft/NT, +BS, nondistended, no peritoneal signs Ext: No cyanosis, No rashes, No petechiae, No lymphangitis, No edema Neuro: CNII-XII intact, strength 4/5 in bilateral upper and lower extremities, no dysmetria  Labs on Admission:  Basic Metabolic Panel:  Recent Labs Lab 07/20/13 1656  NA 140  K 4.3  CL 101  CO2 27  GLUCOSE 114*  BUN 21  CREATININE 1.12*  CALCIUM 9.7   Liver Function Tests: No results found for this basename: AST, ALT, ALKPHOS, BILITOT, PROT, ALBUMIN,  in the last 168 hours No results found for this basename: LIPASE, AMYLASE,  in the last 168 hours No results found for this basename:  AMMONIA,  in the last 168 hours CBC:  Recent Labs Lab 07/20/13 1656  WBC 5.1  HGB 12.2  HCT 36.8  MCV 101.9*  PLT 164   Cardiac Enzymes: No results found for this basename: CKTOTAL, CKMB, CKMBINDEX, TROPONINI,  in the last 168 hours BNP: No components found with this basename: POCBNP,  CBG: No results found for this basename: GLUCAP,  in the last 168 hours  Radiological Exams on Admission: Dg Chest 2 View  07/20/2013   CLINICAL DATA:  Shortness of Breath  EXAM: CHEST  2 VIEW  COMPARISON:  June 24, 2010  FINDINGS: There is scarring in the upper lobes. There is no edema or consolidation. Heart is upper normal in size with normal pulmonary vascularity. No adenopathy.  IMPRESSION: Areas of upper lobe scarring.  No edema or consolidation.   Electronically Signed   By: Lowella Grip M.D.   On:  07/20/2013 17:45   Ct Head Wo Contrast  07/20/2013   CLINICAL DATA:  Dizziness.  EXAM: CT HEAD WITHOUT CONTRAST  TECHNIQUE: Contiguous axial images were obtained from the base of the skull through the vertex without intravenous contrast.  COMPARISON:  CT scan of February 20, 2007; MRI scan of January 07, 2008.  FINDINGS: Bony calvarium appears intact. Moderate diffuse cortical atrophy is noted. Mild chronic ischemic white matter disease is noted. No mass effect or midline shift is noted. Ventricular size is within normal limits. There is no evidence of mass lesion, hemorrhage or acute infarction.  IMPRESSION: Moderate diffuse cortical atrophy. Mild chronic ischemic white matter disease. No acute intracranial abnormality seen.   Electronically Signed   By: Sabino Dick M.D.   On: 07/20/2013 19:52   Ct Angio Chest W/cm &/or Wo Cm  07/20/2013   CLINICAL DATA:  Shortness of breath and low oxygen saturation  EXAM: CT ANGIOGRAPHY CHEST WITH CONTRAST  TECHNIQUE: Multidetector CT imaging of the chest was performed using the standard protocol during bolus administration of intravenous contrast. Multiplanar CT image reconstructions and MIPs were obtained to evaluate the vascular anatomy.  CONTRAST:  136mL OMNIPAQUE IOHEXOL 350 MG/ML SOLN  COMPARISON:  Chest CT April 18, 2004 and chest radiograph Jul 20, 2013  FINDINGS: There is no demonstrable pulmonary embolus. There is no thoracic aortic aneurysm or dissection. There is atherosclerotic change in the aorta. There is calcification in the mitral annulus.  Visualized upper abdominal structures appear unremarkable. There is degenerative change in the thoracic spine. There are no blastic or lytic bone lesions appreciable. No thyroid lesions are identified.  There is underlying centrilobular emphysema. There are scattered areas of scarring in both upper and lower lobes. There is no frank edema or consolidation. Patchy atelectatic change bilaterally is present. There are  scattered 1-2 mm nodular opacities in each upper lobe. No larger pulmonary nodular opacities are identified.  There is no appreciable thoracic adenopathy. The pericardium is not thickened.  Visualized upper abdominal structures appear normal. There are no blastic or lytic bone lesions. Thyroid appears unremarkable.  Review of the MIP images confirms the above findings.  IMPRESSION: No demonstrable pulmonary embolus. Mild reticular interstitial disease. Underlying emphysema with areas of scarring and patchy atelectasis. No edema or consolidation. No adenopathy.   Electronically Signed   By: Lowella Grip M.D.   On: 07/20/2013 19:59    EKG: Independently reviewed. Sinus rhythm, left axis deviation, poor R-wave progression    Time spent:70 minutes Code Status:   FULL Family Communication:   Family at bedside  Orson Eva, DO  [facility] Pager [phone]  If 7PM-7AM, please contact night-coverage [email or web address] Password [facility] 07/20/2013, 9:48 PM

## 2013-07-20 NOTE — ED Notes (Signed)
Pt. Claimed her legs felt "funny' upon ambulation, claimed weak, per tech Asencion Partridge.

## 2013-07-20 NOTE — ED Notes (Signed)
Patient transported to CT 

## 2013-07-20 NOTE — ED Notes (Signed)
Respiratory in to do arterial blood gas.

## 2013-07-20 NOTE — ED Notes (Signed)
Pt's son states that pt was walking back from the bathroom and had a dizzy spell.  Did not fall or pass out.  Not on 02 at home.  No new pain.  States that she does not feel more SOB than usual. Son states that pt's breathing is usually shallow.  Denies dizziness right now.

## 2013-07-20 NOTE — ED Provider Notes (Signed)
CSN: [id number]     Arrival date & time 07/20/13  1615 History   First MD Initiated Contact with Patient 07/20/13 1629     Chief Complaint  Patient presents with  . Dizziness   HPI This morning pt had an episode of feeling lightheaded and dizzy.  She felt like she might fall.  When she was walking she suddenly felt like she had to sit down.  The dizziness has occurred before but it has not been this bad.  Usually it occurs when she is getting up from a sitting a position.  She noticed it when she turned her head this morning but not now.  She has not felt short of breath at rest but when she does certain things she does feel winded.  Family checked her oxygen sat and it was low.    No LOC.  No trouble with speech or coordination.   No focal weakness or numbness. Past Medical History  Diagnosis Date  . Hyperlipidemia   . Hypertension   . Osteopenia   . Thyroid disease   . B12 deficiency   . Arthritis   . Memory loss   . Knee pain     RIGHT  . Gait disturbance    Past Surgical History  Procedure Laterality Date  . Appendectomy    . Cholecystectomy    . Abdominal hysterectomy    . Oophorectomy     Family History  Problem Relation Age of Onset  . Colon cancer    . Melanoma    . Cancer Other     COLON...1ST DEGREE RELATIVE   History  Substance Use Topics  . Smoking status: Never Smoker   . Smokeless tobacco: Never Used  . Alcohol Use: Yes   OB History   Grav Para Term Preterm Abortions TAB SAB Ect Mult Living                 Review of Systems  Constitutional: Negative for fever.  Cardiovascular: Negative for chest pain.  Gastrointestinal: Positive for nausea (one episode earlier today that has resolved). Negative for abdominal pain.  Genitourinary: Negative for dysuria.  Neurological: Negative for speech difficulty and headaches.      Allergies  Diltiazem hcl and Neomycin  Home Medications   Prior to Admission medications   Medication Sig Start Date End  Date Taking? Authorizing Provider  aspirin EC 81 MG tablet Take 1 tablet (81 mg total) by mouth daily. 01/27/13   Rosalita Chessman, DO  atenolol (TENORMIN) 25 MG tablet Take 1 tablet (25 mg total) by mouth daily. 06/19/13   Rosalita Chessman, DO  atorvastatin (LIPITOR) 10 MG tablet 1 tab by mouth daily--office visit with labs are due 06/19/13   Rosalita Chessman, DO  betaxolol (BETOPTIC-S) 0.25 % ophthalmic suspension Place 1 drop into both eyes 2 (two) times daily.      Historical Provider, MD  ciprofloxacin (CIPRO) 500 MG tablet Take 1 tablet (500 mg total) by mouth 2 (two) times daily. 02/01/13   Rosalita Chessman, DO  clopidogrel (PLAVIX) 75 MG tablet TAKE 1 TABLET DAILY 01/27/13   Rosalita Chessman, DO  fenofibrate (TRICOR) 145 MG tablet TAKE 1 TABLET DAILY 06/19/13   Rosalita Chessman, DO  ferrous sulfate 325 (65 FE) MG tablet Take 325 mg by mouth daily with breakfast.      Historical Provider, MD  gabapentin (NEURONTIN) 600 MG tablet Take 1 tablet (600 mg total) by mouth daily. 01/27/13  Rosalita Chessman, DO  HYDROcodone-acetaminophen (VICODIN ES) 7.5-750 MG per tablet Take 1 tablet by mouth every 6 (six) hours as needed.      Historical Provider, MD  levothyroxine (SYNTHROID, LEVOTHROID) 112 MCG tablet Take 1 tablet (112 mcg total) by mouth daily. 01/27/13   Rosalita Chessman, DO  Multiple Vitamin (MULTIVITAMIN) tablet Take 1 tablet by mouth daily.      Historical Provider, MD  pantoprazole (PROTONIX) 40 MG tablet Take 1 tablet (40 mg total) by mouth daily. 01/27/13   Rosalita Chessman, DO  potassium chloride SA (K-DUR,KLOR-CON) 20 MEQ tablet Take 1 tablet (20 mEq total) by mouth daily. 06/19/13   Rosalita Chessman, DO  sertraline (ZOLOFT) 50 MG tablet Take 1 tablet (50 mg total) by mouth daily. 01/27/13   Rosalita Chessman, DO  tolterodine (DETROL LA) 4 MG 24 hr capsule Take 1 capsule (4 mg total) by mouth daily. 01/27/13   Rosalita Chessman, DO  traMADol (ULTRAM) 50 MG tablet Take 1 tablet (50 mg total) by mouth every 6 (six)  hours as needed. 01/27/13   Alferd Apa Lowne, DO  valsartan-hydrochlorothiazide (DIOVAN-HCT) 160-25 MG per tablet Take 1 tablet by mouth daily. 01/27/13   Yvonne R Lowne, DO   BP 137/49  Temp(Src) 98.4 F (36.9 C) (Oral)  Resp 20  SpO2 89% Physical Exam  Nursing note and vitals reviewed. Constitutional: No distress.  HENT:  Head: Normocephalic and atraumatic.  Right Ear: External ear normal.  Left Ear: External ear normal.  Eyes: Conjunctivae are normal. Right eye exhibits no discharge. Left eye exhibits no discharge. No scleral icterus.  Neck: Neck supple. No tracheal deviation present.  Cardiovascular: Normal rate, regular rhythm and intact distal pulses.   Murmur heard. Pulmonary/Chest: Effort normal and breath sounds normal. No stridor. No respiratory distress. She has no wheezes. She has no rales.  Abdominal: Soft. Bowel sounds are normal. She exhibits no distension. There is no tenderness. There is no rebound and no guarding.  Musculoskeletal: She exhibits edema. She exhibits no tenderness.  Neurological: She is alert. She has normal strength. No cranial nerve deficit (no facial droop, extraocular movements intact, no slurred speech) or sensory deficit. She exhibits normal muscle tone. She displays no seizure activity. Coordination normal.  Skin: Skin is warm and dry. Rash noted. She is not diaphoretic.  Few areas pm forehead with scaling, no erythema or discharge  Psychiatric: She has a normal mood and affect.    ED Course  Procedures (including critical care time) Labs Review Labs Reviewed  CBC - Abnormal; Notable for the following:    RBC 3.61 (*)    MCV 101.9 (*)    All other components within normal limits  BASIC METABOLIC PANEL - Abnormal; Notable for the following:    Glucose, Bld 114 (*)    Creatinine, Ser 1.12 (*)    GFR calc non Af Amer 43 (*)    GFR calc Af Amer 49 (*)    All other components within normal limits  PRO B NATRIURETIC PEPTIDE - Abnormal; Notable  for the following:    Pro B Natriuretic peptide (BNP) 982.2 (*)    All other components within normal limits  BLOOD GAS, ARTERIAL - Abnormal; Notable for the following:    pCO2 arterial 46.6 (*)    Bicarbonate 26.1 (*)    All other components within normal limits  Randolm Idol, ED    Imaging Review Dg Chest 2 View  07/20/2013   CLINICAL DATA:  Shortness of Breath  EXAM: CHEST  2 VIEW  COMPARISON:  June 24, 2010  FINDINGS: There is scarring in the upper lobes. There is no edema or consolidation. Heart is upper normal in size with normal pulmonary vascularity. No adenopathy.  IMPRESSION: Areas of upper lobe scarring.  No edema or consolidation.   Electronically Signed   By: Lowella Grip M.D.   On: 07/20/2013 17:45   Ct Head Wo Contrast  07/20/2013   CLINICAL DATA:  Dizziness.  EXAM: CT HEAD WITHOUT CONTRAST  TECHNIQUE: Contiguous axial images were obtained from the base of the skull through the vertex without intravenous contrast.  COMPARISON:  CT scan of February 20, 2007; MRI scan of January 07, 2008.  FINDINGS: Bony calvarium appears intact. Moderate diffuse cortical atrophy is noted. Mild chronic ischemic white matter disease is noted. No mass effect or midline shift is noted. Ventricular size is within normal limits. There is no evidence of mass lesion, hemorrhage or acute infarction.  IMPRESSION: Moderate diffuse cortical atrophy. Mild chronic ischemic white matter disease. No acute intracranial abnormality seen.   Electronically Signed   By: Sabino Dick M.D.   On: 07/20/2013 19:52   Ct Angio Chest W/cm &/or Wo Cm  07/20/2013   CLINICAL DATA:  Shortness of breath and low oxygen saturation  EXAM: CT ANGIOGRAPHY CHEST WITH CONTRAST  TECHNIQUE: Multidetector CT imaging of the chest was performed using the standard protocol during bolus administration of intravenous contrast. Multiplanar CT image reconstructions and MIPs were obtained to evaluate the vascular anatomy.  CONTRAST:  124mL  OMNIPAQUE IOHEXOL 350 MG/ML SOLN  COMPARISON:  Chest CT April 18, 2004 and chest radiograph Jul 20, 2013  FINDINGS: There is no demonstrable pulmonary embolus. There is no thoracic aortic aneurysm or dissection. There is atherosclerotic change in the aorta. There is calcification in the mitral annulus.  Visualized upper abdominal structures appear unremarkable. There is degenerative change in the thoracic spine. There are no blastic or lytic bone lesions appreciable. No thyroid lesions are identified.  There is underlying centrilobular emphysema. There are scattered areas of scarring in both upper and lower lobes. There is no frank edema or consolidation. Patchy atelectatic change bilaterally is present. There are scattered 1-2 mm nodular opacities in each upper lobe. No larger pulmonary nodular opacities are identified.  There is no appreciable thoracic adenopathy. The pericardium is not thickened.  Visualized upper abdominal structures appear normal. There are no blastic or lytic bone lesions. Thyroid appears unremarkable.  Review of the MIP images confirms the above findings.  IMPRESSION: No demonstrable pulmonary embolus. Mild reticular interstitial disease. Underlying emphysema with areas of scarring and patchy atelectasis. No edema or consolidation. No adenopathy.   Electronically Signed   By: Lowella Grip M.D.   On: 07/20/2013 19:59     EKG Interpretation   Date/Time:  Thursday Jul 20 2013 16:34:43 EDT Ventricular Rate:  68 PR Interval:  195 QRS Duration: 102 QT Interval:  401 QTC Calculation: 426 R Axis:   -38 Text Interpretation:  Sinus rhythm Left axis deviation Low voltage,  precordial leads Abnormal R-wave progression, late transition Baseline  wander in lead(s) V2 V4 No significant change since last tracing Confirmed  by Mairim Bade  MD-J, Marlean Mortell [id number]) on 07/20/2013 4:44:55 PM     Medications  furosemide (LASIX) injection 40 mg (not administered)  iohexol (OMNIPAQUE) 350 MG/ML  injection 100 mL (100 mLs Intravenous Contrast Given 07/20/13 1938)    MDM   Final diagnoses:  CHF (congestive  heart failure)  Hypoxia   The patient's chest x-ray did not show pneumonia or definite congestive heart failure. CT scan was performed to rule out pulmonary embolism. CT scan that shows underlying emphysema and mild reticular interstitial disease. Patient's BNP was elevated.  Her ABG on room air did not show significant hypoxia. She attempted to ambulate in the emergency department.  The patient became weak and her oxygen saturation dropped down to 80%.  It is possible that she has mild exacerbation of congestive heart failure. I will give her dose of IV Lasix. I will consult the medical service regarding admission for further treatment and evaluation.    Dorie Rank, MD 07/20/13 2105

## 2013-07-20 NOTE — Telephone Encounter (Signed)
[name], patient's son, called and stated that the patient had a 'dizzy spell' this morning. She was able to get to the chair and has been in the chair since. O2 is currently at 83%. Advised to take her to the ED--stat. Verbalizes understanding. Will take her now.

## 2013-07-20 NOTE — ED Notes (Signed)
Pt. Slow and steady with walker but, "head doesn't feel right."

## 2013-07-20 NOTE — ED Notes (Signed)
Spoke to pt.'s daughter Ishmael Holter on phone and updated on pt.'s condition, verbalized understanding.

## 2013-07-21 ENCOUNTER — Ambulatory Visit: Payer: Medicare Other | Admitting: Family Medicine

## 2013-07-21 ENCOUNTER — Inpatient Hospital Stay (HOSPITAL_COMMUNITY): Payer: Medicare Other

## 2013-07-21 DIAGNOSIS — M79609 Pain in unspecified limb: Secondary | ICD-10-CM

## 2013-07-21 DIAGNOSIS — I509 Heart failure, unspecified: Secondary | ICD-10-CM

## 2013-07-21 DIAGNOSIS — M7989 Other specified soft tissue disorders: Secondary | ICD-10-CM

## 2013-07-21 DIAGNOSIS — I517 Cardiomegaly: Secondary | ICD-10-CM

## 2013-07-21 LAB — BASIC METABOLIC PANEL
BUN: 18 mg/dL (ref 6–23)
CHLORIDE: 99 meq/L (ref 96–112)
CO2: 30 meq/L (ref 19–32)
CREATININE: 1.09 mg/dL (ref 0.50–1.10)
Calcium: 10 mg/dL (ref 8.4–10.5)
GFR calc Af Amer: 51 mL/min — ABNORMAL LOW (ref >90)
GFR calc non Af Amer: 44 mL/min — ABNORMAL LOW (ref >90)
GLUCOSE: 95 mg/dL (ref 70–99)
Potassium: 3.4 mEq/L — ABNORMAL LOW (ref 3.7–5.3)
Sodium: 141 mEq/L (ref 137–147)

## 2013-07-21 LAB — URINALYSIS, ROUTINE W REFLEX MICROSCOPIC
Bilirubin Urine: NEGATIVE
Glucose, UA: NEGATIVE mg/dL
Hgb urine dipstick: NEGATIVE
KETONES UR: NEGATIVE mg/dL
Nitrite: NEGATIVE
PH: 5 (ref 5.0–8.0)
PROTEIN: NEGATIVE mg/dL
SPECIFIC GRAVITY, URINE: 1.02 (ref 1.005–1.030)
UROBILINOGEN UA: 0.2 mg/dL (ref 0.0–1.0)

## 2013-07-21 LAB — CBC
HEMATOCRIT: 37 % (ref 36.0–46.0)
Hemoglobin: 12.1 g/dL (ref 12.0–15.0)
MCH: 33.6 pg (ref 26.0–34.0)
MCHC: 32.7 g/dL (ref 30.0–36.0)
MCV: 102.8 fL — ABNORMAL HIGH (ref 78.0–100.0)
Platelets: 163 10*3/uL (ref 150–400)
RBC: 3.6 MIL/uL — ABNORMAL LOW (ref 3.87–5.11)
RDW: 12.9 % (ref 11.5–15.5)
WBC: 4.8 10*3/uL (ref 4.0–10.5)

## 2013-07-21 LAB — TROPONIN I

## 2013-07-21 LAB — URINE MICROSCOPIC-ADD ON

## 2013-07-21 LAB — TSH: TSH: 6.77 u[IU]/mL — ABNORMAL HIGH (ref 0.350–4.500)

## 2013-07-21 MED ORDER — POTASSIUM CHLORIDE CRYS ER 20 MEQ PO TBCR
40.0000 meq | EXTENDED_RELEASE_TABLET | Freq: Once | ORAL | Status: AC
  Administered 2013-07-21: 40 meq via ORAL
  Filled 2013-07-21: qty 2

## 2013-07-21 MED ORDER — FUROSEMIDE 10 MG/ML IJ SOLN
20.0000 mg | Freq: Once | INTRAMUSCULAR | Status: AC
  Administered 2013-07-21: 20 mg via INTRAVENOUS
  Filled 2013-07-21: qty 2

## 2013-07-21 NOTE — Progress Notes (Signed)
[facility] PROGRESS NOTE  YUN GUTIERREZ GLO:[id number] DOB: 07-27-1923 DOA: 07/20/2013 PCP: Garnet Koyanagi, DO  Assessment/Plan: 1. Dizziness- Resolved, MRI is negative for stroke, Orthostatics negative, UA is negative. Probably secondary to dyspnea. 2. Dyspnea- BNP is elevated to 982, was given lasix last night with good diuretic response.Will repeat another dose of lasix 20 mg IV x one. 3. Hypothyroidism- Continue Synthroid 4. Hypertension- BP stable, hold HCTZ at this time as she has been started on IV lasix. 5. Depression- continue Zoloft.   Code Status: *Full code Family Communication: No family at bedside Disposition Plan: *Home when stable   Consultants:  None  Procedures:  None*  Antibiotics:  None  HPI/Subjective: Patient seen and examined, admitted with dizziness, which has improved. She also had shortness of breath on exertion. o2 sats drop to 90% on RA  Objective: Filed Vitals:   07/21/13 1329  BP: 109/28  Pulse: 67  Temp: 97.4 F (36.3 C)  Resp: 18    Intake/Output Summary (Last 24 hours) at 07/21/13 1613 Last data filed at 07/21/13 1400  Gross per 24 hour  Intake    300 ml  Output      0 ml  Net    300 ml   Filed Weights   07/20/13 2314  Weight: 97.659 kg (215 lb 4.8 oz)    Exam:  Physical Exam: Head: Normocephalic, atraumatic.  Eyes: No signs of jaundice, EOMI Nose: Mucous membranes dry.  Throat: Oropharynx nonerythematous, no exudate appreciated.  Neck: supple,No deformities, masses, or tenderness noted. Lungs: Normal respiratory effort.Bibasilar crackles Heart: Regular RR. S1 and S2 normal  Abdomen: BS normoactive. Soft, Nondistended, non-tender.  Extremities: No pretibial edema, no erythema   Data Reviewed: Basic Metabolic Panel:  Recent Labs Lab 07/20/13 1656 07/21/13 0458  NA 140 141  K 4.3 3.4*  CL 101 99  CO2 27 30  GLUCOSE 114* 95  BUN 21 18  CREATININE 1.12* 1.09  CALCIUM 9.7 10.0   Liver Function  Tests: No results found for this basename: AST, ALT, ALKPHOS, BILITOT, PROT, ALBUMIN,  in the last 168 hours No results found for this basename: LIPASE, AMYLASE,  in the last 168 hours No results found for this basename: AMMONIA,  in the last 168 hours CBC:  Recent Labs Lab 07/20/13 1656 07/21/13 0458  WBC 5.1 4.8  HGB 12.2 12.1  HCT 36.8 37.0  MCV 101.9* 102.8*  PLT 164 163   Cardiac Enzymes:  Recent Labs Lab 07/20/13 2133 07/21/13 0458 07/21/13 1138  TROPONINI <0.30 <0.30 <0.30   BNP (last 3 results)  Recent Labs  07/20/13 1656  PROBNP 982.2*   CBG: No results found for this basename: GLUCAP,  in the last 168 hours  No results found for this or any previous visit (from the past 240 hour(s)).   Studies: Dg Chest 2 View  07/20/2013   CLINICAL DATA:  Shortness of Breath  EXAM: CHEST  2 VIEW  COMPARISON:  June 24, 2010  FINDINGS: There is scarring in the upper lobes. There is no edema or consolidation. Heart is upper normal in size with normal pulmonary vascularity. No adenopathy.  IMPRESSION: Areas of upper lobe scarring.  No edema or consolidation.   Electronically Signed   By: Lowella Grip M.D.   On: 07/20/2013 17:45   Ct Head Wo Contrast  07/20/2013   CLINICAL DATA:  Dizziness.  EXAM: CT HEAD WITHOUT CONTRAST  TECHNIQUE: Contiguous axial images were obtained from the base of the skull through  the vertex without intravenous contrast.  COMPARISON:  CT scan of February 20, 2007; MRI scan of January 07, 2008.  FINDINGS: Bony calvarium appears intact. Moderate diffuse cortical atrophy is noted. Mild chronic ischemic white matter disease is noted. No mass effect or midline shift is noted. Ventricular size is within normal limits. There is no evidence of mass lesion, hemorrhage or acute infarction.  IMPRESSION: Moderate diffuse cortical atrophy. Mild chronic ischemic white matter disease. No acute intracranial abnormality seen.   Electronically Signed   By: Sabino Dick  M.D.   On: 07/20/2013 19:52   Ct Angio Chest W/cm &/or Wo Cm  07/20/2013   CLINICAL DATA:  Shortness of breath and low oxygen saturation  EXAM: CT ANGIOGRAPHY CHEST WITH CONTRAST  TECHNIQUE: Multidetector CT imaging of the chest was performed using the standard protocol during bolus administration of intravenous contrast. Multiplanar CT image reconstructions and MIPs were obtained to evaluate the vascular anatomy.  CONTRAST:  111mL OMNIPAQUE IOHEXOL 350 MG/ML SOLN  COMPARISON:  Chest CT April 18, 2004 and chest radiograph Jul 20, 2013  FINDINGS: There is no demonstrable pulmonary embolus. There is no thoracic aortic aneurysm or dissection. There is atherosclerotic change in the aorta. There is calcification in the mitral annulus.  Visualized upper abdominal structures appear unremarkable. There is degenerative change in the thoracic spine. There are no blastic or lytic bone lesions appreciable. No thyroid lesions are identified.  There is underlying centrilobular emphysema. There are scattered areas of scarring in both upper and lower lobes. There is no frank edema or consolidation. Patchy atelectatic change bilaterally is present. There are scattered 1-2 mm nodular opacities in each upper lobe. No larger pulmonary nodular opacities are identified.  There is no appreciable thoracic adenopathy. The pericardium is not thickened.  Visualized upper abdominal structures appear normal. There are no blastic or lytic bone lesions. Thyroid appears unremarkable.  Review of the MIP images confirms the above findings.  IMPRESSION: No demonstrable pulmonary embolus. Mild reticular interstitial disease. Underlying emphysema with areas of scarring and patchy atelectasis. No edema or consolidation. No adenopathy.   Electronically Signed   By: Lowella Grip M.D.   On: 07/20/2013 19:59   Mr Brain Wo Contrast  07/21/2013   CLINICAL DATA:  Sudden onset of dizziness and blurred vision yesterday with partial resolution.   EXAM: MRI HEAD WITHOUT CONTRAST  TECHNIQUE: Multiplanar, multiecho pulse sequences of the brain and surrounding structures were obtained without intravenous contrast.  COMPARISON:  CT head without contrast 07/20/2013. MRI brain 01/07/2008.  FINDINGS: And and the diffusion-weighted images demonstrate no evidence for acute or subacute infarction.  No hemorrhage or mass lesion is present. The moderate generalized atrophy is present. Mild periventricular and subcortical white matter changes are noted bilaterally. The ventricles are proportionate to the degree of atrophy.  Remote lacunar infarcts are present in the cerebellum and basal ganglia. And  And patient is status post bilateral lens replacements. The paranasal sinuses and mastoid air cells are clear.  IMPRESSION: 1. Moderate generalized atrophy and mild white matter disease. 2. Remote lacunar infarcts in the cerebellum and basal ganglia. 3. No acute intracranial abnormality.   Electronically Signed   By: Lawrence Santiago M.D.   On: 07/21/2013 08:02    Scheduled Meds: . atenolol  25 mg Oral Daily  . betaxolol  1 drop Both Eyes BID  . clopidogrel  75 mg Oral Q breakfast  . enoxaparin (LOVENOX) injection  40 mg Subcutaneous QHS  . ezetimibe-simvastatin  1 tablet  Oral QPM  . fesoterodine  8 mg Oral Daily  . gabapentin  600 mg Oral Daily  . irbesartan  150 mg Oral Daily  . levothyroxine  112 mcg Oral Daily  . pantoprazole  40 mg Oral Daily  . potassium chloride SA  20 mEq Oral Daily  . sertraline  50 mg Oral Daily  . sodium chloride  3 mL Intravenous Q12H   Continuous Infusions:   Active Problems:   HYPOTHYROIDISM   HYPERTENSION   Acute respiratory failure   Hypoxemia   Visual disturbance   Dyspnea on exertion    Time spent: 25 min    Oswald Hillock  [facility] Pager [phone] If 7PM-7AM, please contact night-coverage at [email or web address], password [facility] 07/21/2013, 4:13 PM  LOS: 1 day

## 2013-07-21 NOTE — Progress Notes (Signed)
Echocardiogram 2D Echocardiogram has been performed.  Kirsten Flores 07/21/2013, 3:09 PM

## 2013-07-21 NOTE — Progress Notes (Signed)
*  PRELIMINARY RESULTS* Vascular Ultrasound Lower extremity venous duplex has been completed.  Preliminary findings: No evidence of DVT or baker's cyst.   Landry Mellow, RDMS, RVT  07/21/2013, 3:12 PM

## 2013-07-21 NOTE — Progress Notes (Signed)
Nutrition Brief Note  Patient identified on the Malnutrition Screening Tool (MST) Report  Wt Readings from Last 15 Encounters:  07/20/13 215 lb 4.8 oz (97.659 kg)  01/27/13 229 lb (103.874 kg)  07/27/12 222 lb 12.8 oz (101.061 kg)  03/24/12 227 lb (102.967 kg)  03/03/12 223 lb (101.152 kg)  02/22/12 227 lb 3.2 oz (103.057 kg)  12/28/11 230 lb 3.2 oz (104.418 kg)  04/09/11 228 lb 3.2 oz (103.511 kg)  12/02/10 223 lb (101.152 kg)  10/14/10 225 lb (102.059 kg)  07/08/10 218 lb 3.2 oz (98.975 kg)  06/24/10 226 lb (102.513 kg)  03/06/10 226 lb 12.8 oz (102.876 kg)  12/17/09 229 lb 3.2 oz (103.964 kg)  10/08/09 229 lb (103.874 kg)    Body mass index is 35.83 kg/(m^2). Patient meets criteria for Obesity II based on current BMI.   Current diet order is Heart Healthy, patient is consuming approximately 75% of meals at this time. Labs and medications reviewed.   Pt denied any changes in appetite or weight. Eats three small meals/day consisting of soups, hot cereals, yogurt, and various dishes pt's son prepares. Assisted pt in ordering lunch and dinner meals-encouraged intake of low salt/fat food items. Pt does not consume any type of nutrition supplement at home. Denied any nausea/abd pain or questions regarding current heart healthy diet restrictions  No nutrition interventions warranted at this time. If nutrition issues arise, please consult RD.   Kirsten Abide MS RD LDN Clinical Dietitian [phone]

## 2013-07-22 LAB — BASIC METABOLIC PANEL
BUN: 25 mg/dL — ABNORMAL HIGH (ref 6–23)
BUN: 28 mg/dL — ABNORMAL HIGH (ref 6–23)
CHLORIDE: 101 meq/L (ref 96–112)
CO2: 31 mEq/L (ref 19–32)
CO2: 32 mEq/L (ref 19–32)
Calcium: 9.9 mg/dL (ref 8.4–10.5)
Calcium: 9.9 mg/dL (ref 8.4–10.5)
Chloride: 101 mEq/L (ref 96–112)
Creatinine, Ser: 1.64 mg/dL — ABNORMAL HIGH (ref 0.50–1.10)
Creatinine, Ser: 1.49 mg/dL — ABNORMAL HIGH (ref 0.50–1.10)
GFR calc Af Amer: 31 mL/min — ABNORMAL LOW (ref >90)
GFR calc Af Amer: 35 mL/min — ABNORMAL LOW (ref >90)
GFR calc non Af Amer: 27 mL/min — ABNORMAL LOW (ref >90)
GFR calc non Af Amer: 30 mL/min — ABNORMAL LOW (ref >90)
GLUCOSE: 107 mg/dL — AB (ref 70–99)
Glucose, Bld: 110 mg/dL — ABNORMAL HIGH (ref 70–99)
POTASSIUM: 4.9 meq/L (ref 3.7–5.3)
Potassium: 4.1 mEq/L (ref 3.7–5.3)
SODIUM: 142 meq/L (ref 137–147)
Sodium: 143 mEq/L (ref 137–147)

## 2013-07-22 MED ORDER — VALSARTAN-HYDROCHLOROTHIAZIDE 160-25 MG PO TABS: 0.5000 | ORAL_TABLET | Freq: Every day | ORAL | Status: DC

## 2013-07-22 MED ORDER — SODIUM CHLORIDE 0.9 % IV SOLN
INTRAVENOUS | Status: DC
  Administered 2013-07-22: 1 mL via INTRAVENOUS

## 2013-07-22 NOTE — Progress Notes (Addendum)
CARE MANAGEMENT NOTE 07/22/2013  Patient:  Kirsten Flores, Kirsten Flores   Account Number:  [id number]  Date Initiated:  07/22/2013  Documentation initiated by:  [facility]  Subjective/Objective Assessment:   hypothyroidism, dizziness     Action/Plan:   home   Anticipated DC Date:  07/22/2013   Anticipated DC Plan:  [place]  CM consult      Choice offered to / List presented to:     DME arranged  [facility]      DME agency  [facility].        Status of service:  Completed, signed off Medicare Important Message given?  NA - LOS <3 / Initial given by admissions (If response is "NO", the following Medicare IM given date fields will be blank) Date Medicare IM given:   Date Additional Medicare IM given:    Discharge Disposition:  HOME/SELF CARE  Per UR Regulation:    If discussed at Long Length of Stay Meetings, dates discussed:    Comments:  07/22/2013 1530 NCM contacted [facility] for oxygen. Spoke to pt and gave permission to speak to son. Instructed to contact [facility] when they arrive home to deliver oxygen. Jonnie Finner RN CCM Case Mgmt phone [phone]

## 2013-07-22 NOTE — Discharge Summary (Signed)
Physician Discharge Summary  Kirsten Flores WLN:[id number] DOB: November 01, 1923 DOA: 07/20/2013  PCP: Garnet Koyanagi, DO  Admit date: 07/20/2013 Discharge date: 07/22/2013  Time spent: 50 minutes  Recommendations for Outpatient Follow-up:  1. *Follow up PCP in 2 weeks  Discharge Diagnoses:  Active Problems:   HYPOTHYROIDISM   HYPERTENSION   Acute respiratory failure   Hypoxemia   Visual disturbance   Dyspnea on exertion   Discharge Condition: Resolved  Diet recommendation: Low salt diet  Filed Weights   07/20/13 2314  Weight: 97.659 kg (215 lb 4.8 oz)    History of present illness:  80 year old female with a history of hypertension, hyperlipidemia, hypothyroidism, B12 deficiency presents with one-day history of lightheadedness and near syncope as she was getting up from the commode this morning. She stated that it persisted for 3-4 hours. As a result should call her son and she was brought to the hospital by private vehicle. The patient has also been complaining of shortness of breath which she states "comes and goes" for a couple years. She denies any fevers, chills, chest pain, nausea, vomiting, diarrhea, abdominal pain, dysuria, hematuria. She denies any orthopnea, PND, significant weight gain. Patient has chronic bilateral lower extremity edema which she states is unchanged over the last several months. She states that most of her shortness of breath comes with exertion although she states that this has not been any worse than usual. She has remote tobacco history, quit 40 years ago. Regarding her dizziness, patient states that this was accompanied by some blurry vision for 3-4 hours. She denies any focal tremor weakness, syncope, dysarthria, any other dysesthesias. The patient denies any new medications. She denies any dysuria, hematuria, abdominal pain, hematochezia, melena.  In the emergency department, the patient received furosemide 40 mg IV x1. Her vitals were stable. EKG shows sinus  rhythm with poor R-wave progression. BMP was unremarkable. CBC was unremarkable. BNP was 982. On the patient and family, the patient's oxygenation decreased to 89%   Hospital Course:  1. Dizziness- Resolved, MRI is negative for stroke, Orthostatics negative, UA is negative. Probably secondary to dyspnea. Will cut down the valsartan-HCTZ to 1/2 tab po daily. 2. Dyspnea- BNP is elevated to 982, was given IV  lasix  with good diuretic response. 2D echo showed grade 1 diastolic dysfunction. Will continue with HCTZ 12.5 mg po daily at home. patient's oxygen saturation dropped to 79 on walking, will need home oxygen. Patient has underlying emphysema as per CT chest. No PE was seen on the CT angio. 3. Hypothyroidism- Continue Synthroid 4. Hypertension- BP on the lower side, will cut down the dose of Valsartan/HCTZ 160-25  to 0.5 tab daily,  And continue taking Atenolol 25 mg po daily. 5. AKI- Developed AKI due to diuresis with IV lasix. Started gentle IV hydration, Cr improved from 1.64- 1.49.  6. Depression- continue Zoloft.      Patient has now improved, she is breathing better. Dizziness has resolved at this time.  Procedures:  2 d echo- Grade 1 diastolic dysfunction  Lower extremity doppler- negative for DVT  Consultations:  None  Discharge Exam: Filed Vitals:   07/22/13 1228  BP: 113/48  Pulse: 70  Temp: 97.4 F (36.3 C)  Resp: 16    General: Appear in no acute distress Cardiovascular: S1s2 RRR Respiratory: Clear bilaterally  Discharge Instructions You were cared for by a hospitalist during your hospital stay. If you have any questions about your discharge medications or the care you received while you  were in the hospital after you are discharged, you can call the unit and asked to speak with the hospitalist on call if the hospitalist that took care of you is not available. Once you are discharged, your primary care physician will handle any further medical issues. Please note  that NO REFILLS for any discharge medications will be authorized once you are discharged, as it is imperative that you return to your primary care physician (or establish a relationship with a primary care physician if you do not have one) for your aftercare needs so that they can reassess your need for medications and monitor your lab values.  Discharge Instructions   Diet - low sodium heart healthy    Complete by:  As directed      Increase activity slowly    Complete by:  As directed             Medication List         atenolol 25 MG tablet  Commonly known as:  TENORMIN  Take 1 tablet (25 mg total) by mouth daily.     betaxolol 0.25 % ophthalmic suspension  Commonly known as:  BETOPTIC-S  Place 1 drop into both eyes 2 (two) times daily.     clopidogrel 75 MG tablet  Commonly known as:  PLAVIX  Take 75 mg by mouth daily with breakfast.     ezetimibe-simvastatin 10-20 MG per tablet  Commonly known as:  VYTORIN  Take 1 tablet by mouth every evening.     gabapentin 600 MG tablet  Commonly known as:  NEURONTIN  Take 1 tablet (600 mg total) by mouth daily.     levothyroxine 112 MCG tablet  Commonly known as:  SYNTHROID, LEVOTHROID  Take 1 tablet (112 mcg total) by mouth daily.     pantoprazole 40 MG tablet  Commonly known as:  PROTONIX  Take 1 tablet (40 mg total) by mouth daily.     potassium chloride SA 20 MEQ tablet  Commonly known as:  K-DUR,KLOR-CON  Take 1 tablet (20 mEq total) by mouth daily.     sertraline 50 MG tablet  Commonly known as:  ZOLOFT  Take 1 tablet (50 mg total) by mouth daily.     tolterodine 4 MG 24 hr capsule  Commonly known as:  DETROL LA  Take 1 capsule (4 mg total) by mouth daily.     TYLENOL ARTHRITIS PAIN 650 MG CR tablet  Generic drug:  acetaminophen  Take 1,300 mg by mouth 2 (two) times daily.     valsartan-hydrochlorothiazide 160-25 MG per tablet  Commonly known as:  DIOVAN-HCT  Take 1 tablet by mouth daily.       Allergies   Allergen Reactions  . Diltiazem Hcl Other (See Comments)    Unknown reaction  . Neomycin Other (See Comments)    Unknown reaction (a long time ago)      The results of significant diagnostics from this hospitalization (including imaging, microbiology, ancillary and laboratory) are listed below for reference.    Significant Diagnostic Studies: Dg Chest 2 View  07/20/2013   CLINICAL DATA:  Shortness of Breath  EXAM: CHEST  2 VIEW  COMPARISON:  June 24, 2010  FINDINGS: There is scarring in the upper lobes. There is no edema or consolidation. Heart is upper normal in size with normal pulmonary vascularity. No adenopathy.  IMPRESSION: Areas of upper lobe scarring.  No edema or consolidation.   Electronically Signed   By: Lowella Grip M.D.  On: 07/20/2013 17:45   Ct Head Wo Contrast  07/20/2013   CLINICAL DATA:  Dizziness.  EXAM: CT HEAD WITHOUT CONTRAST  TECHNIQUE: Contiguous axial images were obtained from the base of the skull through the vertex without intravenous contrast.  COMPARISON:  CT scan of February 20, 2007; MRI scan of January 07, 2008.  FINDINGS: Bony calvarium appears intact. Moderate diffuse cortical atrophy is noted. Mild chronic ischemic white matter disease is noted. No mass effect or midline shift is noted. Ventricular size is within normal limits. There is no evidence of mass lesion, hemorrhage or acute infarction.  IMPRESSION: Moderate diffuse cortical atrophy. Mild chronic ischemic white matter disease. No acute intracranial abnormality seen.   Electronically Signed   By: Sabino Dick M.D.   On: 07/20/2013 19:52   Ct Angio Chest W/cm &/or Wo Cm  07/20/2013   CLINICAL DATA:  Shortness of breath and low oxygen saturation  EXAM: CT ANGIOGRAPHY CHEST WITH CONTRAST  TECHNIQUE: Multidetector CT imaging of the chest was performed using the standard protocol during bolus administration of intravenous contrast. Multiplanar CT image reconstructions and MIPs were obtained to  evaluate the vascular anatomy.  CONTRAST:  181mL OMNIPAQUE IOHEXOL 350 MG/ML SOLN  COMPARISON:  Chest CT April 18, 2004 and chest radiograph Jul 20, 2013  FINDINGS: There is no demonstrable pulmonary embolus. There is no thoracic aortic aneurysm or dissection. There is atherosclerotic change in the aorta. There is calcification in the mitral annulus.  Visualized upper abdominal structures appear unremarkable. There is degenerative change in the thoracic spine. There are no blastic or lytic bone lesions appreciable. No thyroid lesions are identified.  There is underlying centrilobular emphysema. There are scattered areas of scarring in both upper and lower lobes. There is no frank edema or consolidation. Patchy atelectatic change bilaterally is present. There are scattered 1-2 mm nodular opacities in each upper lobe. No larger pulmonary nodular opacities are identified.  There is no appreciable thoracic adenopathy. The pericardium is not thickened.  Visualized upper abdominal structures appear normal. There are no blastic or lytic bone lesions. Thyroid appears unremarkable.  Review of the MIP images confirms the above findings.  IMPRESSION: No demonstrable pulmonary embolus. Mild reticular interstitial disease. Underlying emphysema with areas of scarring and patchy atelectasis. No edema or consolidation. No adenopathy.   Electronically Signed   By: Lowella Grip M.D.   On: 07/20/2013 19:59   Mr Brain Wo Contrast  07/21/2013   CLINICAL DATA:  Sudden onset of dizziness and blurred vision yesterday with partial resolution.  EXAM: MRI HEAD WITHOUT CONTRAST  TECHNIQUE: Multiplanar, multiecho pulse sequences of the brain and surrounding structures were obtained without intravenous contrast.  COMPARISON:  CT head without contrast 07/20/2013. MRI brain 01/07/2008.  FINDINGS: And and the diffusion-weighted images demonstrate no evidence for acute or subacute infarction.  No hemorrhage or mass lesion is present. The  moderate generalized atrophy is present. Mild periventricular and subcortical white matter changes are noted bilaterally. The ventricles are proportionate to the degree of atrophy.  Remote lacunar infarcts are present in the cerebellum and basal ganglia. And  And patient is status post bilateral lens replacements. The paranasal sinuses and mastoid air cells are clear.  IMPRESSION: 1. Moderate generalized atrophy and mild white matter disease. 2. Remote lacunar infarcts in the cerebellum and basal ganglia. 3. No acute intracranial abnormality.   Electronically Signed   By: Lawrence Santiago M.D.   On: 07/21/2013 08:02    Microbiology: Recent Results (from the past  240 hour(s))  URINE CULTURE     Status: None   Collection Time    07/21/13  6:20 AM      Result Value Ref Range Status   Specimen Description URINE, RANDOM   Final   Special Requests NONE   Final   Culture  Setup Time     Final   Value: 07/21/2013 10:32     Performed at [facility]     Final   Value: 75,000 COLONIES/ML     Performed at [facility]   Culture     Final   Value: ESCHERICHIA COLI     Performed at [facility]   Report Status PENDING   Incomplete     Labs: Basic Metabolic Panel:  Recent Labs Lab 07/20/13 1656 07/21/13 0458 07/22/13 0400 07/22/13 1336  NA 140 141 142 143  K 4.3 3.4* 4.1 4.9  CL 101 99 101 101  CO2 27 30 31  32  GLUCOSE 114* 95 110* 107*  BUN 21 18 25* 28*  CREATININE 1.12* 1.09 1.64* 1.49*  CALCIUM 9.7 10.0 9.9 9.9   Liver Function Tests: No results found for this basename: AST, ALT, ALKPHOS, BILITOT, PROT, ALBUMIN,  in the last 168 hours No results found for this basename: LIPASE, AMYLASE,  in the last 168 hours No results found for this basename: AMMONIA,  in the last 168 hours CBC:  Recent Labs Lab 07/20/13 1656 07/21/13 0458  WBC 5.1 4.8  HGB 12.2 12.1  HCT 36.8 37.0  MCV 101.9* 102.8*  PLT 164 163   Cardiac Enzymes:  Recent  Labs Lab 07/20/13 2133 07/21/13 0458 07/21/13 1138  TROPONINI <0.30 <0.30 <0.30   BNP: BNP (last 3 results)  Recent Labs  07/20/13 1656  PROBNP 982.2*   CBG: No results found for this basename: GLUCAP,  in the last 168 hours     Signed:  Oswald Hillock  [facility] 07/22/2013, 2:41 PM

## 2013-07-22 NOTE — Progress Notes (Signed)
SATURATION QUALIFICATIONS: (This note is used to comply with regulatory documentation for home oxygen) ° °Patient Saturations on Room Air at Rest = 95% ° °Patient Saturations on Room Air while Ambulating = 79% ° °Patient Saturations on 2 Liters of oxygen while Ambulating = 93% ° °Please briefly explain why patient needs home oxygen: ° ° °

## 2013-07-23 LAB — URINE CULTURE: Colony Count: 75000

## 2013-07-24 NOTE — Progress Notes (Signed)
Discharge summary sent to payer through [facility]  

## 2013-07-25 ENCOUNTER — Telehealth: Payer: Self-pay

## 2013-07-25 NOTE — Telephone Encounter (Signed)
Called, patient was sleeping, son Jaquay Morneault picked up the phone.  Stated that he was familiar with patient's information and would be driving patient to her appointment.  He was encouraged to sign a release of information form during patient's next office visit.  He stated he would.  He shared that patient was seen in the ED and later admitted to [facility] on 07/20/13 for dizziness and dyspnea and was discharged on 07/22/13.  She is currently "doing a little bit better."  She's on oxygen around the clock.  Stated that patient's dizziness was attributed to low oxygen levels and now has to wear oxygen 24/7.  Ms. Hinz is still independent of her ADLS.  Ambulates with a walker.  Has a sitter twice a week.  Is maintaining a low sodium/moderate sugar intake.  She is currently afraid to drive due to oxygen tank.  Son is now having to drive patient until she is more comfortable.    Allergies:  verified with son Medication:  Son stated that he would bring med list during patient's appt. 90 day supply/mail order:  [facility]MAIL [facility] - [place], [place] - [place] Local pharmacy:  [id number] - [place], [address] AT [place] OF [address]  No changes to personal, family history or past surgical hx  Appointment scheduled:  07/27/13 @ 2:15 pm.

## 2013-07-27 ENCOUNTER — Ambulatory Visit (INDEPENDENT_AMBULATORY_CARE_PROVIDER_SITE_OTHER): Payer: Medicare Other | Admitting: Family Medicine

## 2013-07-27 ENCOUNTER — Encounter: Payer: Self-pay | Admitting: Family Medicine

## 2013-07-27 ENCOUNTER — Ambulatory Visit (HOSPITAL_BASED_OUTPATIENT_CLINIC_OR_DEPARTMENT_OTHER)
Admission: RE | Admit: 2013-07-27 | Discharge: 2013-07-27 | Disposition: A | Discharge status: Home / Self Care | Payer: Medicare Other | Source: Ambulatory Visit | Attending: Family Medicine | Admitting: Family Medicine

## 2013-07-27 VITALS — BP 120/68 | HR 64 | Temp 98.1°F

## 2013-07-27 DIAGNOSIS — J984 Other disorders of lung: Secondary | ICD-10-CM | POA: Insufficient documentation

## 2013-07-27 DIAGNOSIS — J441 Chronic obstructive pulmonary disease with (acute) exacerbation: Secondary | ICD-10-CM

## 2013-07-27 DIAGNOSIS — R0602 Shortness of breath: Secondary | ICD-10-CM

## 2013-07-27 DIAGNOSIS — R7981 Abnormal blood-gas level: Secondary | ICD-10-CM

## 2013-07-27 NOTE — Patient Instructions (Signed)

## 2013-07-27 NOTE — Progress Notes (Signed)
Pre visit review using our clinic review tool, if applicable. No additional management support is needed unless otherwise documented below in the visit note. 

## 2013-07-28 ENCOUNTER — Telehealth: Payer: Self-pay | Admitting: Family Medicine

## 2013-07-28 ENCOUNTER — Encounter: Payer: Self-pay | Admitting: Family Medicine

## 2013-07-28 NOTE — Telephone Encounter (Signed)
Discussed with [name] and made him aware of the ONO and he said it was not discussed at the visit but now that he know's what is going on he feels better about the process, he is thinking about a home health nurse coming in an helping out with cleaning and caring for the patient while he is at work, I made him aware to let us know because since she has Medicaid we can fax the required information to the state and he voiced understanding and agreed.      KP

## 2013-07-28 NOTE — Progress Notes (Signed)
Subjective:    Patient ID: Kirsten Flores, female    DOB: 01-12-1924, 80 y.o.   MRN: [id number]  HPI Pt is here for f/u from hospital.  She is doing better but still c/o sob.  She is in the exam room in a wheelchair.     Past Medical History  Diagnosis Date  . Hyperlipidemia   . Hypertension   . Osteopenia   . Thyroid disease   . B12 deficiency   . Arthritis   . Memory loss   . Knee pain     RIGHT  . Gait disturbance    History   Social History  . Marital Status: Widowed    Spouse Name: N/A    Number of Children: N/A  . Years of Education: N/A   Occupational History  . RETIRED NURSE    Social History Main Topics  . Smoking status: Former Research scientist (life sciences)  . Smokeless tobacco: Never Used  . Alcohol Use: Yes     Comment: 1 cocktail in the evening and a glass of wine - not everyday but doesn't state how often  . Drug Use: No  . Sexual Activity: Not on file   Other Topics Concern  . Not on file   Social History Narrative   HUSBAND DIED IN Dec 28, 2009   Current Outpatient Prescriptions  Medication Sig Dispense Refill  . acetaminophen (TYLENOL ARTHRITIS PAIN) 650 MG CR tablet Take 1,300 mg by mouth 2 (two) times daily.      Marland Kitchen atenolol (TENORMIN) 25 MG tablet Take 1 tablet (25 mg total) by mouth daily.  90 tablet  1  . betaxolol (BETOPTIC-S) 0.25 % ophthalmic suspension Place 1 drop into both eyes 2 (two) times daily.        . clopidogrel (PLAVIX) 75 MG tablet Take 75 mg by mouth daily with breakfast.      . ezetimibe-simvastatin (VYTORIN) 10-20 MG per tablet Take 1 tablet by mouth every evening.      . gabapentin (NEURONTIN) 600 MG tablet Take 1 tablet (600 mg total) by mouth daily.  90 tablet  1  . levothyroxine (SYNTHROID, LEVOTHROID) 112 MCG tablet Take 1 tablet (112 mcg total) by mouth daily.  90 tablet  1  . pantoprazole (PROTONIX) 40 MG tablet Take 1 tablet (40 mg total) by mouth daily.  90 tablet  1  . potassium chloride SA (K-DUR,KLOR-CON) 20 MEQ tablet Take 1 tablet (20  mEq total) by mouth daily.  90 tablet  1  . sertraline (ZOLOFT) 50 MG tablet Take 1 tablet (50 mg total) by mouth daily.  90 tablet  1  . tolterodine (DETROL LA) 4 MG 24 hr capsule Take 1 capsule (4 mg total) by mouth daily.  90 capsule  1  . valsartan-hydrochlorothiazide (DIOVAN-HCT) 160-25 MG per tablet Take 0.5 tablets by mouth daily.  90 tablet  3  . atorvastatin (LIPITOR) 10 MG tablet       . [DISCONTINUED] tolterodine (DETROL LA) 4 MG 24 hr capsule Take 1 capsule (4 mg total) by mouth daily.  90 capsule  0   No current facility-administered medications for this visit.   .   Review of Systems    as above Objective:   Physical Exam BP 120/68  Pulse 64  Temp(Src) 98.1 F (36.7 C) (Oral)  SpO2 89% General appearance: alert, cooperative, appears stated age and no distress Nose: Nares normal. Septum midline. Mucosa normal. No drainage or sinus tenderness. Throat: lips, mucosa, and tongue normal; teeth  and gums normal Neck: no adenopathy, no carotid bruit, no JVD, supple, symmetrical, trachea midline and thyroid not enlarged, symmetric, no tenderness/mass/nodules Lungs: rales bilaterally Heart: S1, S2 normal Extremities: edema +1 pitting,  better then when she went to hospital        Assessment & Plan:  1. COPD exacerbation Worsening-- check ONO with home health Pt is on home o2 - Ambulatory referral to Pulmonology - Pulse oximetry, overnight; Future  2. SOB (shortness of breath) ? chf - DG Chest 2 View; Future - Pulse oximetry, overnight; Future  3. Low oxygen saturation   - Pulse oximetry, overnight; Future

## 2013-07-28 NOTE — Telephone Encounter (Signed)
Caller name: [name] Relation to pt: son Call back [phone]   Reason for call:  [name] called and states that [place] showed up at pt's house and were under the impression that pt was coming off the Oxygen.  [name] and pt are unaware of this.  Please contact as soon as possible because [name] will not be able to speak with [place] after 4pm today

## 2013-08-01 NOTE — Telephone Encounter (Signed)
Patient's son, [name], walked in to the office today and requested information concerning the process of home health care set up. Please advise. JG//CMA

## 2013-08-02 NOTE — Telephone Encounter (Signed)
I made her son aware in a previous call that I will need a copy of her Medicaid card for in home health private nurse, otherwise I can send the referral to [place].  MSG left for a return call.     KP

## 2013-08-02 NOTE — Telephone Encounter (Signed)
Forms faxed      [place]

## 2013-08-02 NOTE — Telephone Encounter (Signed)
Call back from Mrs.Mezo and she advised the her Medicaid ID number is [id number]. I advised that I will fax the paper to [facility] today. I have completed the paperwork and will fax once I get MD signature.     KP

## 2013-08-07 ENCOUNTER — Other Ambulatory Visit: Payer: Self-pay

## 2013-08-07 DIAGNOSIS — R52 Pain, unspecified: Secondary | ICD-10-CM

## 2013-08-07 DIAGNOSIS — E039 Hypothyroidism, unspecified: Secondary | ICD-10-CM

## 2013-08-07 DIAGNOSIS — F32A Depression, unspecified: Secondary | ICD-10-CM

## 2013-08-07 DIAGNOSIS — R32 Unspecified urinary incontinence: Secondary | ICD-10-CM

## 2013-08-07 DIAGNOSIS — K219 Gastro-esophageal reflux disease without esophagitis: Secondary | ICD-10-CM

## 2013-08-07 DIAGNOSIS — F329 Major depressive disorder, single episode, unspecified: Secondary | ICD-10-CM

## 2013-08-07 MED ORDER — LEVOTHYROXINE SODIUM 125 MCG PO TABS: 125.0000 ug | ORAL_TABLET | Freq: Every day | ORAL | Status: DC

## 2013-08-07 MED ORDER — TOLTERODINE TARTRATE ER 4 MG PO CP24: 4.0000 mg | ORAL_CAPSULE | Freq: Every day | ORAL | Status: DC

## 2013-08-07 MED ORDER — LEVOTHYROXINE SODIUM 112 MCG PO TABS: 112.0000 ug | ORAL_TABLET | Freq: Every day | ORAL | Status: DC

## 2013-08-07 MED ORDER — GABAPENTIN 600 MG PO TABS: 600.0000 mg | ORAL_TABLET | Freq: Every day | ORAL | Status: DC

## 2013-08-07 MED ORDER — PANTOPRAZOLE SODIUM 40 MG PO TBEC: 40.0000 mg | DELAYED_RELEASE_TABLET | Freq: Every day | ORAL | Status: DC

## 2013-08-07 MED ORDER — SERTRALINE HCL 50 MG PO TABS: 50.0000 mg | ORAL_TABLET | Freq: Every day | ORAL | Status: DC

## 2013-08-07 MED ORDER — CLOPIDOGREL BISULFATE 75 MG PO TABS: 75.0000 mg | ORAL_TABLET | Freq: Every day | ORAL | Status: DC

## 2013-08-07 NOTE — Telephone Encounter (Signed)
Increase synthroid to 125 mcg #30  2 refills Recheck 2 months tsh  244.9

## 2013-08-07 NOTE — Telephone Encounter (Signed)
Request for 112 mcg of synthroid. Please see recent lab TSH and advise      KP

## 2013-08-07 NOTE — Telephone Encounter (Signed)
Patient aware she will recheck her labs on 10/30/13.    KP

## 2013-08-07 NOTE — Addendum Note (Signed)
Addended by: Ewing Schlein on: 08/07/2013 05:04 PM   Modules accepted: Orders

## 2013-08-09 ENCOUNTER — Encounter: Payer: Self-pay | Admitting: Family Medicine

## 2013-08-15 ENCOUNTER — Ambulatory Visit (INDEPENDENT_AMBULATORY_CARE_PROVIDER_SITE_OTHER): Payer: Medicare Other | Admitting: Emergency Medicine

## 2013-08-15 ENCOUNTER — Encounter: Payer: Self-pay | Admitting: Emergency Medicine

## 2013-08-15 VITALS — BP 120/64 | HR 77 | Ht 65.0 in | Wt 214.0 lb

## 2013-08-15 DIAGNOSIS — R0989 Other specified symptoms and signs involving the circulatory and respiratory systems: Secondary | ICD-10-CM

## 2013-08-15 DIAGNOSIS — R0609 Other forms of dyspnea: Secondary | ICD-10-CM

## 2013-08-15 DIAGNOSIS — R0602 Shortness of breath: Secondary | ICD-10-CM

## 2013-08-15 NOTE — Patient Instructions (Signed)
We will start Spiriva once inhalation daily Turn your oxygen up to 3L/min when you are at home using your concentrator. Use 2-3L/min with exertion on your oxygen tanks.  Follow with Dr Lamonte Sakai in 1 month

## 2013-08-15 NOTE — Assessment & Plan Note (Signed)
Multifactorial - obesity, overall deconditioning, HTN / diastolic dysfxn, but also consider component of COPD given her tobacco hx.  - will perform spirometry today - consider starting spiriva and following for improvement - need to insure that she is adequately oxygenated > will increase to 3L/min when she is using her home concentrator.  - rov 1

## 2013-08-15 NOTE — Progress Notes (Signed)
Subjective:    Patient ID: Kirsten Flores, female    DOB: [birth year], 80 y.o.   MRN: [id number]  HPI 80 yo woman, former smoker (23 pk-yrs), HTN diastolic dysfxn, mild PAH, hyperlipidemia, arthritis. She was recently admitted for a near syncopal episode. CT chest at that time showed no PE, possible mild GG infiltrate that improved through the hospitalization. They started o2 at the hospital. She was treated with diuresis, fluid restriction. She describes SOB that showed up with walking. Her son helps transport her.  She slipped and fell last night, EMS came to help.  She hears occasional cough, never wheezes.    Review of Systems  Constitutional: Negative for fever and unexpected weight change.  HENT: Positive for congestion and postnasal drip. Negative for dental problem, ear pain, nosebleeds, rhinorrhea, sinus pressure, sneezing, sore throat and trouble swallowing.   Eyes: Negative for redness and itching.  Respiratory: Positive for shortness of breath. Negative for cough, chest tightness and wheezing.   Cardiovascular: Positive for leg swelling. Negative for palpitations.       Ankles and feet  Gastrointestinal: Negative for nausea and vomiting.  Genitourinary: Negative for dysuria.  Musculoskeletal: Negative for joint swelling.  Skin: Negative for rash.  Neurological: Negative for headaches.  Hematological: Bruises/bleeds easily.  Psychiatric/Behavioral: Negative for dysphoric mood. The patient is not nervous/anxious.    Past Medical History  Diagnosis Date  . Hyperlipidemia   . Hypertension   . Osteopenia   . Thyroid disease   . B12 deficiency   . Arthritis   . Memory loss   . Knee pain     RIGHT  . Gait disturbance      Family History  Problem Relation Age of Onset  . Colon cancer    . Melanoma    . Cancer Other     COLON...1ST DEGREE RELATIVE     History   Social History  . Marital Status: Widowed    Spouse Name: N/A    Number of Children: N/A  . Years of  Education: N/A   Occupational History  . RETIRED NURSE    Social History Main Topics  . Smoking status: Former Smoker -- 2.00 packs/day for 25 years    Types: Cigarettes    Quit date: 03/03/1971  . Smokeless tobacco: Never Used  . Alcohol Use: Yes     Comment: 1 cocktail in the evening and a glass of wine - not everyday but doesn't state how often  . Drug Use: No  . Sexual Activity: Not on file   Other Topics Concern  . Not on file   Social History Narrative   HUSBAND DIED IN 2009/12/11     Allergies  Allergen Reactions  . Diltiazem Hcl Other (See Comments)    Unknown reaction  . Neomycin Other (See Comments)    Unknown reaction (a long time ago)     Outpatient Prescriptions Prior to Visit  Medication Sig Dispense Refill  . acetaminophen (TYLENOL ARTHRITIS PAIN) 650 MG CR tablet Take 1,300 mg by mouth 2 (two) times daily.      Marland Kitchen atenolol (TENORMIN) 25 MG tablet Take 1 tablet (25 mg total) by mouth daily.  90 tablet  1  . atorvastatin (LIPITOR) 10 MG tablet       . betaxolol (BETOPTIC-S) 0.25 % ophthalmic suspension Place 1 drop into both eyes 2 (two) times daily.        . clopidogrel (PLAVIX) 75 MG tablet Take 1 tablet (75 mg total)  by mouth daily with breakfast.  90 tablet  3  . ezetimibe-simvastatin (VYTORIN) 10-20 MG per tablet Take 1 tablet by mouth every evening.      . gabapentin (NEURONTIN) 600 MG tablet Take 1 tablet (600 mg total) by mouth at bedtime.  90 tablet  3  . levothyroxine (SYNTHROID, LEVOTHROID) 125 MCG tablet Take 1 tablet (125 mcg total) by mouth daily.  90 tablet  0  . pantoprazole (PROTONIX) 40 MG tablet Take 1 tablet (40 mg total) by mouth daily.  90 tablet  3  . potassium chloride SA (K-DUR,KLOR-CON) 20 MEQ tablet Take 1 tablet (20 mEq total) by mouth daily.  90 tablet  1  . sertraline (ZOLOFT) 50 MG tablet Take 1 tablet (50 mg total) by mouth daily.  90 tablet  3  . tolterodine (DETROL LA) 4 MG 24 hr capsule Take 1 capsule (4 mg total) by mouth daily.   90 capsule  3  . valsartan-hydrochlorothiazide (DIOVAN-HCT) 160-25 MG per tablet Take 0.5 tablets by mouth daily.  90 tablet  3   No facility-administered medications prior to visit.         Objective:   Physical Exam   TTE 07/21/13 --  Study Conclusions - Left ventricle: The cavity size was normal. Wall thickness was increased in a pattern of mild LVH. Systolic function was normal. The estimated ejection fraction was in the range of 55% to 60%. Wall motion was normal; there were no regional wall motion abnormalities. Doppler parameters are consistent with abnormal left ventricular relaxation (grade 1 diastolic dysfunction). - Aortic valve: There was trivial regurgitation. - Mitral valve: Calcified annulus. - Left atrium: The atrium was mildly dilated. - Right ventricle: The cavity size was mildly dilated. Wall thickness was normal  CT scan 07/20/13 --  COMPARISON: Chest CT April 18, 2004 and chest radiograph Jul 20, 2013  FINDINGS:  There is no demonstrable pulmonary embolus. There is no thoracic  aortic aneurysm or dissection. There is atherosclerotic change in  the aorta. There is calcification in the mitral annulus.  Visualized upper abdominal structures appear unremarkable. There is  degenerative change in the thoracic spine. There are no blastic or  lytic bone lesions appreciable. No thyroid lesions are identified.  There is underlying centrilobular emphysema. There are scattered  areas of scarring in both upper and lower lobes. There is no frank  edema or consolidation. Patchy atelectatic change bilaterally is  present. There are scattered 1-2 mm nodular opacities in each upper  lobe. No larger pulmonary nodular opacities are identified.  There is no appreciable thoracic adenopathy. The pericardium is not  thickened.  Visualized upper abdominal structures appear normal. There are no  blastic or lytic bone lesions. Thyroid appears unremarkable.  Review of the  MIP images confirms the above findings.  IMPRESSION:  No demonstrable pulmonary embolus. Mild reticular interstitial  disease. Underlying emphysema with areas of scarring and patchy  atelectasis. No edema or consolidation. No adenopathy.      Assessment & Plan:  Dyspnea on exertion Multifactorial - obesity, overall deconditioning, HTN / diastolic dysfxn, but also consider component of COPD given her tobacco hx.  - will perform spirometry today - consider starting spiriva and following for improvement - need to insure that she is adequately oxygenated > will increase to 3L/min when she is using her home concentrator.  - rov 1

## 2013-08-20 DIAGNOSIS — R0602 Shortness of breath: Secondary | ICD-10-CM

## 2013-08-20 DIAGNOSIS — J441 Chronic obstructive pulmonary disease with (acute) exacerbation: Secondary | ICD-10-CM

## 2013-08-20 DIAGNOSIS — R7981 Abnormal blood-gas level: Secondary | ICD-10-CM

## 2013-09-06 ENCOUNTER — Telehealth: Payer: Self-pay | Admitting: *Deleted

## 2013-09-06 ENCOUNTER — Telehealth: Payer: Self-pay | Admitting: Family Medicine

## 2013-09-06 ENCOUNTER — Other Ambulatory Visit: Payer: Self-pay | Admitting: Family Medicine

## 2013-09-06 DIAGNOSIS — J441 Chronic obstructive pulmonary disease with (acute) exacerbation: Secondary | ICD-10-CM

## 2013-09-06 DIAGNOSIS — R269 Unspecified abnormalities of gait and mobility: Secondary | ICD-10-CM

## 2013-09-06 DIAGNOSIS — J9601 Acute respiratory failure with hypoxia: Secondary | ICD-10-CM

## 2013-09-06 DIAGNOSIS — R413 Other amnesia: Secondary | ICD-10-CM

## 2013-09-06 NOTE — Telephone Encounter (Signed)
Received a call from [facility] in regards to referral for Kirsten Flores aide. They are unable to provide this service for patients unless there is a skilled nursing need. Currently they are not able to take any new cases due to staffing concerns and all the HHRN's are running a maximum pt load. She is going to gather more info for [place] with services available for [facility] CNA's and send all that info to Kirsten Flores, Therapist, sports.   The son stated that they have previously used [facility] and the cost per hour would be $30.00 (they need someone 2-3 hours daily), this is unaffordable to them. Patient's son needs an aide to assist the patient with ADL's and light house keeping and light meal prep for the patient.

## 2013-09-06 NOTE — Progress Notes (Signed)
Spoke with patient's son who stated that they have not received any HH services. In reviewing the EMR, Maudie Mercury has been working diligently on this and faxed all necessary forms to [place] on 08/02/13. New referral order placed and son has been made aware.

## 2013-09-06 NOTE — Telephone Encounter (Signed)
A user error has taken place.

## 2013-09-14 ENCOUNTER — Ambulatory Visit (INDEPENDENT_AMBULATORY_CARE_PROVIDER_SITE_OTHER): Payer: Medicare Other | Admitting: Emergency Medicine

## 2013-09-14 ENCOUNTER — Encounter: Payer: Self-pay | Admitting: Emergency Medicine

## 2013-09-14 VITALS — BP 130/76 | HR 87 | Ht 66.0 in | Wt 220.0 lb

## 2013-09-14 DIAGNOSIS — J449 Chronic obstructive pulmonary disease, unspecified: Secondary | ICD-10-CM

## 2013-09-14 MED ORDER — TIOTROPIUM BROMIDE MONOHYDRATE 2.5 MCG/ACT IN AERS: 1.0000 | INHALATION_SPRAY | Freq: Every day | RESPIRATORY_TRACT | Status: DC

## 2013-09-14 NOTE — Patient Instructions (Signed)
Please continue Spiriva respimat once a day Wear your oxygen at 3L/min at all times.  Follow with Dr Lamonte Sakai in 6 months or sooner if you have any problems

## 2013-09-14 NOTE — Progress Notes (Signed)
Subjective:    Patient ID: Kirsten Flores, female    DOB: 08/01/1923, 80 y.o.   MRN: [id number]  HPI 80 yo woman, former smoker (10 pk-yrs), HTN diastolic dysfxn, mild PAH, hyperlipidemia, arthritis. She was recently admitted for a near syncopal episode. CT chest at that time showed no PE, possible mild GG infiltrate that improved through the hospitalization. They started o2 at the hospital. She was treated with diuresis, fluid restriction. She describes SOB that showed up with walking. Her son helps transport her.  She slipped and fell last night, EMS came to help.  She hears occasional cough, never wheezes.   ROV 09/14/13 -- follows for multifactorial dyspnea, hypoxemia, suspected COPD based on hx and spirometry. She started spiriva respimat last time, not sure whether it has helped her. She believes that the o2 has helped her more. She has the help of her son. She gets around in the house with her O2 on but still has limitations.    Review of Systems  Constitutional: Negative for fever and unexpected weight change.  HENT: Positive for congestion and postnasal drip. Negative for dental problem, ear pain, nosebleeds, rhinorrhea, sinus pressure, sneezing, sore throat and trouble swallowing.   Eyes: Negative for redness and itching.  Respiratory: Positive for shortness of breath. Negative for cough, chest tightness and wheezing.   Cardiovascular: Positive for leg swelling. Negative for palpitations.       Ankles and feet  Gastrointestinal: Negative for nausea and vomiting.  Genitourinary: Negative for dysuria.  Musculoskeletal: Negative for joint swelling.  Skin: Negative for rash.  Neurological: Negative for headaches.  Hematological: Bruises/bleeds easily.  Psychiatric/Behavioral: Negative for dysphoric mood. The patient is not nervous/anxious.        Objective:   Physical Exam Filed Vitals:   09/14/13 1644  BP: 130/76  Pulse: 87  Height: 5\' 6"  (1.676 m)  Weight: 220 lb (99.791  kg)  SpO2: 91%   Gen: Pleasant, obese woman in wheelchair, in no distress,  normal affect  ENT: No lesions,  mouth clear,  oropharynx clear, no postnasal drip  Neck: No JVD, no TMG, no carotid bruits  Lungs: No use of accessory muscles, distant but clear without rales or rhonchi  Cardiovascular: RRR, heart sounds normal, no murmur or gallops, no peripheral edema  Musculoskeletal: No deformities, no cyanosis or clubbing  Neuro: alert, non focal  Skin: Warm, no lesions or rashes    TTE 07/21/13 --  Study Conclusions - Left ventricle: The cavity size was normal. Wall thickness was increased in a pattern of mild LVH. Systolic function was normal. The estimated ejection fraction was in the range of 55% to 60%. Wall motion was normal; there were no regional wall motion abnormalities. Doppler parameters are consistent with abnormal left ventricular relaxation (grade 1 diastolic dysfunction). - Aortic valve: There was trivial regurgitation. - Mitral valve: Calcified annulus. - Left atrium: The atrium was mildly dilated. - Right ventricle: The cavity size was mildly dilated. Wall thickness was normal  CT scan 07/20/13 --  COMPARISON: Chest CT April 18, 2004 and chest radiograph Jul 20, 2013  FINDINGS:  There is no demonstrable pulmonary embolus. There is no thoracic  aortic aneurysm or dissection. There is atherosclerotic change in  the aorta. There is calcification in the mitral annulus.  Visualized upper abdominal structures appear unremarkable. There is  degenerative change in the thoracic spine. There are no blastic or  lytic bone lesions appreciable. No thyroid lesions are identified.  There is  underlying centrilobular emphysema. There are scattered  areas of scarring in both upper and lower lobes. There is no frank  edema or consolidation. Patchy atelectatic change bilaterally is  present. There are scattered 1-2 mm nodular opacities in each upper  lobe. No larger  pulmonary nodular opacities are identified.  There is no appreciable thoracic adenopathy. The pericardium is not  thickened.  Visualized upper abdominal structures appear normal. There are no  blastic or lytic bone lesions. Thyroid appears unremarkable.  Review of the MIP images confirms the above findings.  IMPRESSION:  No demonstrable pulmonary embolus. Mild reticular interstitial  disease. Underlying emphysema with areas of scarring and patchy  atelectasis. No edema or consolidation. No adenopathy.      Assessment & Plan:  Mixed type COPD (chronic obstructive pulmonary disease) Based on spirometry and clinical hx. Spiriva has helped her some, but she has benefited more from o2 - consistent with multifactorial nature of her dyspnea.  - agrees to continue spiriva to see how she benefits - o2 - rov 6

## 2013-09-14 NOTE — Assessment & Plan Note (Signed)
Based on spirometry and clinical hx. Spiriva has helped her some, but she has benefited more from o2 - consistent with multifactorial nature of her dyspnea.  - agrees to continue spiriva to see how she benefits - o2 - rov 6

## 2013-10-24 ENCOUNTER — Telehealth: Payer: Self-pay | Admitting: Family Medicine

## 2013-10-24 DIAGNOSIS — R52 Pain, unspecified: Secondary | ICD-10-CM

## 2013-10-24 DIAGNOSIS — F32A Depression, unspecified: Secondary | ICD-10-CM

## 2013-10-24 DIAGNOSIS — F329 Major depressive disorder, single episode, unspecified: Secondary | ICD-10-CM

## 2013-10-24 MED ORDER — ATENOLOL 25 MG PO TABS: 25.0000 mg | ORAL_TABLET | Freq: Every day | ORAL | Status: DC

## 2013-10-24 MED ORDER — SERTRALINE HCL 50 MG PO TABS: 50.0000 mg | ORAL_TABLET | Freq: Every day | ORAL | Status: DC

## 2013-10-24 MED ORDER — GABAPENTIN 600 MG PO TABS: 600.0000 mg | ORAL_TABLET | Freq: Every day | ORAL | Status: DC

## 2013-10-24 MED ORDER — POTASSIUM CHLORIDE CRYS ER 20 MEQ PO TBCR: 20.0000 meq | EXTENDED_RELEASE_TABLET | Freq: Every day | ORAL | Status: DC

## 2013-10-24 MED ORDER — PANTOPRAZOLE SODIUM 40 MG PO TBEC: 40.0000 mg | DELAYED_RELEASE_TABLET | Freq: Every day | ORAL | Status: DC

## 2013-10-24 NOTE — Telephone Encounter (Signed)
Spoke with patient and she have me the 5 medications that needed to be filled, some of these med's were sent back in June but the patient said they did not have the new script on file . Med's sent       KP

## 2013-10-24 NOTE — Telephone Encounter (Signed)
Caller name: Bera  Relation to pt: self  Call back number: [phone] Pharmacy: [id number] [place], [place]  [phone]   Reason for call: pt requesting a refill on all medication. Pt next appointment CPE 12/26/2013.

## 2013-10-30 ENCOUNTER — Ambulatory Visit (INDEPENDENT_AMBULATORY_CARE_PROVIDER_SITE_OTHER): Payer: Medicare Other | Admitting: Family Medicine

## 2013-10-30 ENCOUNTER — Encounter: Payer: Self-pay | Admitting: Family Medicine

## 2013-10-30 VITALS — BP 116/60 | HR 71 | Temp 98.5°F | Wt 255.0 lb

## 2013-10-30 DIAGNOSIS — Z9181 History of falling: Secondary | ICD-10-CM | POA: Insufficient documentation

## 2013-10-30 DIAGNOSIS — J449 Chronic obstructive pulmonary disease, unspecified: Secondary | ICD-10-CM

## 2013-10-30 DIAGNOSIS — I10 Essential (primary) hypertension: Secondary | ICD-10-CM

## 2013-10-30 DIAGNOSIS — J4489 Other specified chronic obstructive pulmonary disease: Secondary | ICD-10-CM

## 2013-10-30 DIAGNOSIS — E039 Hypothyroidism, unspecified: Secondary | ICD-10-CM

## 2013-10-30 DIAGNOSIS — E785 Hyperlipidemia, unspecified: Secondary | ICD-10-CM

## 2013-10-30 DIAGNOSIS — Z23 Encounter for immunization: Secondary | ICD-10-CM

## 2013-10-30 MED ORDER — VALSARTAN-HYDROCHLOROTHIAZIDE 160-25 MG PO TABS: 0.5000 | ORAL_TABLET | Freq: Every day | ORAL | Status: DC

## 2013-10-30 NOTE — Progress Notes (Addendum)
Subjective:    Patient ID: Kirsten Flores, female    DOB: [birth year], 80 y.o.   MRN: [id number]  HPI Pt is here with her son for f/u.  Her breathing is better with O2 and spiriva per pt.  She feels the swelling is better too.  They still have not heard from home health about personal care services.  No new complaints.    Review of Systems As above  Past Medical History  Diagnosis Date  . Hyperlipidemia   . Hypertension   . Osteopenia   . Thyroid disease   . B12 deficiency   . Arthritis   . Memory loss   . Knee pain     RIGHT  . Gait disturbance    History   Social History  . Marital Status: Widowed    Spouse Name: N/A    Number of Children: N/A  . Years of Education: N/A   Occupational History  . RETIRED NURSE    Social History Main Topics  . Smoking status: Former Smoker -- 2.00 packs/day for 25 years    Types: Cigarettes    Quit date: 03/03/1971  . Smokeless tobacco: Never Used  . Alcohol Use: Yes     Comment: 1 cocktail in the evening and a glass of wine - not everyday but doesn't state how often  . Drug Use: No  . Sexual Activity: Not on file   Other Topics Concern  . Not on file   Social History Narrative   HUSBAND DIED IN Dec 06, 2009   Current Outpatient Prescriptions  Medication Sig Dispense Refill  . acetaminophen (TYLENOL ARTHRITIS PAIN) 650 MG CR tablet Take 1,300 mg by mouth 2 (two) times daily.      Marland Kitchen atenolol (TENORMIN) 25 MG tablet Take 1 tablet (25 mg total) by mouth daily.  90 tablet  1  . atorvastatin (LIPITOR) 10 MG tablet       . betaxolol (BETOPTIC-S) 0.25 % ophthalmic suspension Place 1 drop into both eyes 2 (two) times daily.        . clopidogrel (PLAVIX) 75 MG tablet Take 1 tablet (75 mg total) by mouth daily with breakfast.  90 tablet  3  . ezetimibe-simvastatin (VYTORIN) 10-20 MG per tablet Take 1 tablet by mouth every evening.      . gabapentin (NEURONTIN) 600 MG tablet Take 1 tablet (600 mg total) by mouth at bedtime.  90 tablet  3    . levothyroxine (SYNTHROID, LEVOTHROID) 125 MCG tablet Take 1 tablet (125 mcg total) by mouth daily.  90 tablet  0  . pantoprazole (PROTONIX) 40 MG tablet Take 1 tablet (40 mg total) by mouth daily.  90 tablet  3  . potassium chloride SA (K-DUR,KLOR-CON) 20 MEQ tablet Take 1 tablet (20 mEq total) by mouth daily.  90 tablet  1  . sertraline (ZOLOFT) 50 MG tablet Take 1 tablet (50 mg total) by mouth daily.  90 tablet  3  . Tiotropium Bromide Monohydrate (SPIRIVA RESPIMAT) 2.5 MCG/ACT AERS Inhale 1 puff into the lungs daily.  1 Inhaler  6  . tolterodine (DETROL LA) 4 MG 24 hr capsule Take 1 capsule (4 mg total) by mouth daily.  90 capsule  3  . valsartan-hydrochlorothiazide (DIOVAN-HCT) 160-25 MG per tablet Take 0.5 tablets by mouth daily.  90 tablet  3  . [DISCONTINUED] tolterodine (DETROL LA) 4 MG 24 hr capsule Take 1 capsule (4 mg total) by mouth daily.  90 capsule  0   No  current facility-administered medications for this visit.   Allergies  Allergen Reactions  . Diltiazem Hcl Other (See Comments)    Unknown reaction  . Neomycin Other (See Comments)    Unknown reaction (a long time ago)        Objective:   Physical Exam BP 116/60  Pulse 71  Temp(Src) 98.5 F (36.9 C) (Oral)  Wt 255 lb (115.667 kg)  SpO2 93% General appearance: alert, cooperative, appears stated age and no distress Throat: lips, mucosa, and tongue normal; teeth and gums normal Neck: no adenopathy, supple, symmetrical, trachea midline and thyroid not enlarged, symmetric, no tenderness/mass/nodules Lungs: rales bibasilar Heart: S1, S2 normal--+ murmur Extremities: edema tr pitting edema        Assessment & Plan:  1. Need for prophylactic vaccination and inoculation against influenza   - Flu Vaccine QUAD 36+ mos PF IM (Fluarix Quad PF)  2. Essential hypertension Stable, cont meds - Basic metabolic panel - CBC with Differential - POCT urinalysis dipstick - valsartan-hydrochlorothiazide (DIOVAN-HCT)  160-25 MG per tablet; Take 0.5 tablets by mouth daily.  Dispense: 90 tablet; Refill: 3  3. Other and unspecified hyperlipidemia Check labs - Hepatic function panel - Lipid panel  4. Unspecified hypothyroidism Check labs con't meds - TSH  5. COPD mixed type Per pulm

## 2013-10-30 NOTE — Progress Notes (Signed)
Pre visit review using our clinic review tool, if applicable. No additional management support is needed unless otherwise documented below in the visit note. 

## 2013-10-30 NOTE — Patient Instructions (Signed)

## 2013-10-30 NOTE — Assessment & Plan Note (Signed)
PT Kirsten Flores NEEDS AND FOR PERSONAL CARE

## 2013-10-31 LAB — CBC WITH DIFFERENTIAL/PLATELET
Basophils Absolute: 0 10*3/uL (ref 0.0–0.1)
Basophils Relative: 0.9 % (ref 0.0–3.0)
Eosinophils Absolute: 0.2 10*3/uL (ref 0.0–0.7)
Eosinophils Relative: 4 % (ref 0.0–5.0)
HEMATOCRIT: 30.8 % — AB (ref 36.0–46.0)
Hemoglobin: 10.2 g/dL — ABNORMAL LOW (ref 12.0–15.0)
LYMPHS ABS: 1.1 10*3/uL (ref 0.7–4.0)
Lymphocytes Relative: 26.2 % (ref 12.0–46.0)
MCHC: 33 g/dL (ref 30.0–36.0)
MCV: 104.2 fl — AB (ref 78.0–100.0)
MONO ABS: 0.4 10*3/uL (ref 0.1–1.0)
Monocytes Relative: 8.9 % (ref 3.0–12.0)
Neutro Abs: 2.6 10*3/uL (ref 1.4–7.7)
Neutrophils Relative %: 60 % (ref 43.0–77.0)
PLATELETS: 125 10*3/uL — AB (ref 150.0–400.0)
RBC: 2.96 Mil/uL — ABNORMAL LOW (ref 3.87–5.11)
RDW: 13.4 % (ref 11.5–15.5)
WBC: 4.3 10*3/uL (ref 4.0–10.5)

## 2013-10-31 LAB — HEPATIC FUNCTION PANEL
ALBUMIN: 3.6 g/dL (ref 3.5–5.2)
ALT: 15 U/L (ref 0–35)
AST: 29 U/L (ref 0–37)
Alkaline Phosphatase: 29 U/L — ABNORMAL LOW (ref 39–117)
Bilirubin, Direct: 0.2 mg/dL (ref 0.0–0.3)
TOTAL PROTEIN: 6.1 g/dL (ref 6.0–8.3)
Total Bilirubin: 0.6 mg/dL (ref 0.2–1.2)

## 2013-10-31 LAB — POCT URINALYSIS DIPSTICK
BILIRUBIN UA: NEGATIVE
Blood, UA: NEGATIVE
GLUCOSE UA: NEGATIVE
KETONES UA: NEGATIVE
Leukocytes, UA: NEGATIVE
Nitrite, UA: NEGATIVE
Protein, UA: NEGATIVE
SPEC GRAV UA: 1.01
Urobilinogen, UA: 0.2
pH, UA: 6.5

## 2013-10-31 LAB — BASIC METABOLIC PANEL
BUN: 26 mg/dL — AB (ref 6–23)
CHLORIDE: 107 meq/L (ref 96–112)
CO2: 31 mEq/L (ref 19–32)
Calcium: 9.3 mg/dL (ref 8.4–10.5)
Creatinine, Ser: 1.1 mg/dL (ref 0.4–1.2)
GFR: 50.82 mL/min — ABNORMAL LOW (ref >60.00)
Glucose, Bld: 106 mg/dL — ABNORMAL HIGH (ref 70–99)
Potassium: 4.3 mEq/L (ref 3.5–5.1)
Sodium: 144 mEq/L (ref 135–145)

## 2013-10-31 LAB — LIPID PANEL
CHOLESTEROL: 105 mg/dL (ref 0–200)
HDL: 27.3 mg/dL — ABNORMAL LOW (ref >39.00)
LDL Cholesterol: 40 mg/dL (ref 0–99)
NonHDL: 77.7
TRIGLYCERIDES: 187 mg/dL — AB (ref 0.0–149.0)
Total CHOL/HDL Ratio: 4
VLDL: 37.4 mg/dL (ref 0.0–40.0)

## 2013-10-31 LAB — TSH: TSH: 0.53 u[IU]/mL (ref 0.35–4.50)

## 2013-11-09 ENCOUNTER — Telehealth: Payer: Self-pay

## 2013-11-09 NOTE — Telephone Encounter (Signed)
Home Health Certification and Plan of Care received via fax from [facility] via fax.  Forms are labeled.  Placed labeled forms in Dr. Nonda Lou red folder for review and signature.

## 2013-12-12 ENCOUNTER — Other Ambulatory Visit: Payer: Medicare Other

## 2013-12-14 ENCOUNTER — Other Ambulatory Visit: Payer: Self-pay

## 2013-12-14 MED ORDER — LEVOTHYROXINE SODIUM 125 MCG PO TABS: 125.0000 ug | ORAL_TABLET | Freq: Every day | ORAL | Status: DC

## 2013-12-20 ENCOUNTER — Emergency Department (HOSPITAL_BASED_OUTPATIENT_CLINIC_OR_DEPARTMENT_OTHER): Payer: Medicare Other

## 2013-12-20 ENCOUNTER — Encounter (HOSPITAL_BASED_OUTPATIENT_CLINIC_OR_DEPARTMENT_OTHER): Payer: Self-pay | Admitting: Emergency Medicine

## 2013-12-20 ENCOUNTER — Emergency Department (HOSPITAL_BASED_OUTPATIENT_CLINIC_OR_DEPARTMENT_OTHER)
Admission: EM | Admit: 2013-12-20 | Discharge: 2013-12-20 | Disposition: A | Discharge status: Home / Self Care | Payer: Medicare Other | Attending: Emergency Medicine | Admitting: Emergency Medicine

## 2013-12-20 DIAGNOSIS — Z79899 Other long term (current) drug therapy: Secondary | ICD-10-CM | POA: Insufficient documentation

## 2013-12-20 DIAGNOSIS — Z23 Encounter for immunization: Secondary | ICD-10-CM | POA: Insufficient documentation

## 2013-12-20 DIAGNOSIS — Z7902 Long term (current) use of antithrombotics/antiplatelets: Secondary | ICD-10-CM | POA: Diagnosis not present

## 2013-12-20 DIAGNOSIS — S20211A Contusion of right front wall of thorax, initial encounter: Secondary | ICD-10-CM | POA: Insufficient documentation

## 2013-12-20 DIAGNOSIS — Z87891 Personal history of nicotine dependence: Secondary | ICD-10-CM | POA: Insufficient documentation

## 2013-12-20 DIAGNOSIS — W01198A Fall on same level from slipping, tripping and stumbling with subsequent striking against other object, initial encounter: Secondary | ICD-10-CM | POA: Diagnosis not present

## 2013-12-20 DIAGNOSIS — E079 Disorder of thyroid, unspecified: Secondary | ICD-10-CM | POA: Insufficient documentation

## 2013-12-20 DIAGNOSIS — E785 Hyperlipidemia, unspecified: Secondary | ICD-10-CM | POA: Insufficient documentation

## 2013-12-20 DIAGNOSIS — IMO0002 Reserved for concepts with insufficient information to code with codable children: Secondary | ICD-10-CM

## 2013-12-20 DIAGNOSIS — S92911A Unspecified fracture of right toe(s), initial encounter for closed fracture: Secondary | ICD-10-CM

## 2013-12-20 DIAGNOSIS — Y92009 Unspecified place in unspecified non-institutional (private) residence as the place of occurrence of the external cause: Secondary | ICD-10-CM | POA: Insufficient documentation

## 2013-12-20 DIAGNOSIS — S99921A Unspecified injury of right foot, initial encounter: Secondary | ICD-10-CM | POA: Diagnosis present

## 2013-12-20 DIAGNOSIS — I10 Essential (primary) hypertension: Secondary | ICD-10-CM | POA: Insufficient documentation

## 2013-12-20 DIAGNOSIS — S92511A Displaced fracture of proximal phalanx of right lesser toe(s), initial encounter for closed fracture: Secondary | ICD-10-CM | POA: Insufficient documentation

## 2013-12-20 DIAGNOSIS — M1990 Unspecified osteoarthritis, unspecified site: Secondary | ICD-10-CM | POA: Diagnosis not present

## 2013-12-20 DIAGNOSIS — S91114A Laceration without foreign body of right lesser toe(s) without damage to nail, initial encounter: Secondary | ICD-10-CM | POA: Insufficient documentation

## 2013-12-20 DIAGNOSIS — Y9389 Activity, other specified: Secondary | ICD-10-CM | POA: Diagnosis not present

## 2013-12-20 DIAGNOSIS — W19XXXA Unspecified fall, initial encounter: Secondary | ICD-10-CM

## 2013-12-20 MED ORDER — HYDROCODONE-ACETAMINOPHEN 5-325 MG PO TABS: 1.0000 | ORAL_TABLET | Freq: Four times a day (QID) | ORAL | Status: DC | PRN

## 2013-12-20 MED ORDER — TETANUS-DIPHTH-ACELL PERTUSSIS 5-2.5-18.5 LF-MCG/0.5 IM SUSP
0.5000 mL | Freq: Once | INTRAMUSCULAR | Status: AC
  Administered 2013-12-20: 0.5 mL via INTRAMUSCULAR
  Filled 2013-12-20: qty 0.5

## 2013-12-20 MED ORDER — HYDROCODONE-ACETAMINOPHEN 5-325 MG PO TABS
1.0000 | ORAL_TABLET | Freq: Once | ORAL | Status: AC
  Administered 2013-12-20: 1 via ORAL
  Filled 2013-12-20: qty 1

## 2013-12-20 NOTE — ED Provider Notes (Signed)
CSN: [id number]     Arrival date & time 12/20/13  1147 History   First MD Initiated Contact with Patient 12/20/13 1151     Chief Complaint  Patient presents with  . Fall     (Consider location/radiation/quality/duration/timing/severity/associated sxs/prior Treatment) HPI  This is an 80 year old female who presents following a fall. Patient has a history of osteopenia, hyperlipidemia, hypertension. At baseline she is on 3-3.5 L  of home oxygen.  Patient normally walks with the walker. She reports that she got caught up in her oxygen tubing and fell onto her right side. She's complaining of right chest wall pain. She did not hit her head or lose consciousness. She is also reporting pain over her right third toe. She denies any increased shortness of breath, abdominal pain, nausea, vomiting.  Past Medical History  Diagnosis Date  . Hyperlipidemia   . Hypertension   . Osteopenia   . Thyroid disease   . B12 deficiency   . Arthritis   . Memory loss   . Knee pain     RIGHT  . Gait disturbance    Past Surgical History  Procedure Laterality Date  . Appendectomy    . Cholecystectomy    . Abdominal hysterectomy    . Oophorectomy     Family History  Problem Relation Age of Onset  . Colon cancer    . Melanoma    . Cancer Other     COLON...1ST DEGREE RELATIVE   History  Substance Use Topics  . Smoking status: Former Smoker -- 2.00 packs/day for 25 years    Types: Cigarettes    Quit date: 03/03/1971  . Smokeless tobacco: Never Used  . Alcohol Use: Yes     Comment: 1 cocktail in the evening and a glass of wine - not everyday but doesn't state how often   OB History   Grav Para Term Preterm Abortions TAB SAB Ect Mult Living                 Review of Systems  Constitutional: Negative for fever.  Respiratory: Positive for chest tightness. Negative for cough and shortness of breath.   Cardiovascular: Positive for chest pain. Negative for leg swelling.  Gastrointestinal:  Negative for nausea, vomiting and abdominal pain.  Genitourinary: Negative for dysuria.  Musculoskeletal: Negative for back pain and neck pain.  Skin: Positive for wound.  Neurological: Negative for headaches.  Psychiatric/Behavioral: Negative for confusion.  All other systems reviewed and are negative.     Allergies  Diltiazem hcl and Neomycin  Home Medications   Prior to Admission medications   Medication Sig Start Date End Date Taking? Authorizing Provider  acetaminophen (TYLENOL ARTHRITIS PAIN) 650 MG CR tablet Take 1,300 mg by mouth 2 (two) times daily.    Historical Provider, MD  atenolol (TENORMIN) 25 MG tablet Take 1 tablet (25 mg total) by mouth daily. 10/24/13   Rosalita Chessman, DO  atorvastatin (LIPITOR) 10 MG tablet  07/01/13   Historical Provider, MD  betaxolol (BETOPTIC-S) 0.25 % ophthalmic suspension Place 1 drop into both eyes 2 (two) times daily.      Historical Provider, MD  clopidogrel (PLAVIX) 75 MG tablet Take 1 tablet (75 mg total) by mouth daily with breakfast. 08/07/13   Rosalita Chessman, DO  ezetimibe-simvastatin (VYTORIN) 10-20 MG per tablet Take 1 tablet by mouth every evening.    Historical Provider, MD  gabapentin (NEURONTIN) 600 MG tablet Take 1 tablet (600 mg total) by mouth at bedtime.  10/24/13   Rosalita Chessman, DO  HYDROcodone-acetaminophen (NORCO/VICODIN) 5-325 MG per tablet Take 1 tablet by mouth every 6 (six) hours as needed for moderate pain. 12/20/13   Merryl Hacker, MD  levothyroxine (SYNTHROID, LEVOTHROID) 125 MCG tablet Take 1 tablet (125 mcg total) by mouth daily. 12/14/13   Alferd Apa Lowne, DO  pantoprazole (PROTONIX) 40 MG tablet Take 1 tablet (40 mg total) by mouth daily. 10/24/13   Rosalita Chessman, DO  potassium chloride SA (K-DUR,KLOR-CON) 20 MEQ tablet Take 1 tablet (20 mEq total) by mouth daily. 10/24/13   Rosalita Chessman, DO  sertraline (ZOLOFT) 50 MG tablet Take 1 tablet (50 mg total) by mouth daily. 10/24/13   Rosalita Chessman, DO  Tiotropium  Bromide Monohydrate (SPIRIVA RESPIMAT) 2.5 MCG/ACT AERS Inhale 1 puff into the lungs daily. 09/14/13   Collene Gobble, MD  tolterodine (DETROL LA) 4 MG 24 hr capsule Take 1 capsule (4 mg total) by mouth daily. 08/07/13   Rosalita Chessman, DO  valsartan-hydrochlorothiazide (DIOVAN-HCT) 160-25 MG per tablet Take 0.5 tablets by mouth daily. 10/30/13   Yvonne R Lowne, DO   BP 137/63  Pulse 61  Temp(Src) 98 F (36.7 C) (Oral)  Resp 18  SpO2 100% Physical Exam  Nursing note and vitals reviewed. Constitutional: She is oriented to person, place, and time. No distress.  Elderly, chronically ill-appearing, home oxygen in place, no acute distress  HENT:  Head: Normocephalic and atraumatic.  Mouth/Throat: Oropharynx is clear and moist.  Eyes: Pupils are equal, round, and reactive to light.  Neck: Normal range of motion. Neck supple.  No midline C-spine  Cardiovascular: Normal rate, regular rhythm and normal heart sounds.   No murmur heard. Pulmonary/Chest: Effort normal. No respiratory distress. She has no wheezes. She exhibits tenderness.  Coarse breath sounds bilaterally, oxygen in place, tenderness to palpation over the right lateral chest wall without crepitus, no obvious ecchymosis  Abdominal: Soft. Bowel sounds are normal. There is no tenderness. There is no rebound.  Musculoskeletal: She exhibits no edema.  Range of motion of the bilateral hips and knees with no obvious deformity or pain  Neurological: She is alert and oriented to person, place, and time.  Skin: Skin is warm.  Small laceration on the plantar aspect of the right third toe at the proximal phalanx approximately 1/2 cm, bleeding controlled  Psychiatric: She has a normal mood and affect.    ED Course  Procedures (including critical care time) Labs Review Labs Reviewed - No data to display  Imaging Review Dg Chest 2 View  12/20/2013   CLINICAL DATA:  Fall.  Chest pain  EXAM: CHEST  2 VIEW  COMPARISON:  07/27/2013  FINDINGS:  Prominent heart size without heart failure. Negative for pneumonia or effusion. Mild right lower lobe atelectasis.  IMPRESSION: Cardiac enlargement.  Mild right lower lobe atelectasis.   Electronically Signed   By: Franchot Gallo M.D.   On: 12/20/2013 12:52   Dg Foot Complete Right  12/20/2013   CLINICAL DATA:  False morning with soft tissue lacerations and pain  EXAM: RIGHT FOOT COMPLETE - 3+ VIEW  COMPARISON:  None.  FINDINGS: There are changes in the distal aspect of the third metatarsal which appear to be related to prior fracture with some degree of healing. Correlation to point tenderness is recommended. Irregularity at the distal aspect of the first proximal phalanx and at the base of the second proximal phalanx is noted. These may represent mildly displaced fractures.  Generalized osteopenia and degenerative changes noted within the tarsal bones. Vascular calcifications are seen.  IMPRESSION: Changes in the first and second proximal phalanges as described which may represent acute fractures. Correlation to point tenderness is recommended.  Changes of Prior fracture in the distal third metatarsal.   Electronically Signed   By: Inez Catalina M.D.   On: 12/20/2013 14:22     EKG Interpretation None      MDM   Final diagnoses:  Fall, initial encounter  Chest wall contusion, right, initial encounter  Toe fracture, right, closed, initial encounter  Laceration    Patient presents following a mechanical fall.  Nontoxic.  ABCs intact.  TTP over the right chest wall without crepitus.  Also with laceration plantar aspect of third digit.  On baseline home O2.  CHest xray without pneumothorax or fluid.  NO obvious rib fractures.  LIkely contusion.  ?Fractures of the 1st and 2nd digits.  Clinically patient is nontender and laceration is at the base of the 3rd digit.  Will treat with postop shoe.  Patient opted to not have the laceration closed and do close wound care.  Discussed with patient and son the  importance of pain control for the chest wall contusion and her risk for PNA following injury.  She was given an incentive spirometer and will be given pain medication.    After history, exam, and medical workup I feel the patient has been appropriately medically screened and is safe for discharge home. Pertinent diagnoses were discussed with the patient. Patient was given return precautions.     Merryl Hacker, MD 12/21/13 2011

## 2013-12-20 NOTE — ED Notes (Signed)
Pt to ED by [facility] for fall that occurred at home. Pt tripped and fell over oxygen tubing. Pt reports hitting back of head and right ribs.

## 2013-12-20 NOTE — Discharge Instructions (Signed)
You were seen today following a fall. Your chest x-ray is negative and does not show any evidence of collapsed lung with rib fracture. He likely have bruising over your right chest wall causing her pain. You are at increased risk for pneumonia if your pain is not well controlled. He will be given an incentive spirometer and should use it 3 times daily. You'll also be given pain medication.  He was also noted that he had a laceration on her toe. He need to dress this with bacitracin and monitor for infection. You'll be given a postop shoe for comfort. He may have small nondisplaced fractures in the foot but this is hard to establish given your severe osteopenia. Buddy taping and postop shoe recommended. Followup with your primary Dr.  Rae Lips Fracture Your caregiver has diagnosed you as having a fractured toe. A toe fracture is a break in the bone of a toe. "Buddy taping" is a way of splinting your broken toe, by taping the broken toe to the toe next to it. This "buddy taping" will keep the injured toe from moving beyond normal range of motion. Buddy taping also helps the toe heal in a more normal alignment. It may take 6 to 8 weeks for the toe injury to heal. [place] your toes taped together for as long as directed by your caregiver or until you see a doctor for a follow-up examination. You can change the tape after bathing. Always use a small piece of gauze or cotton between the toes when taping them together. This will help the skin stay dry and prevent infection.  Apply ice to the injury for 15-20 minutes each hour while awake for the first 2 days. Put the ice in a plastic bag and place a towel between the bag of ice and your skin.  After the first 2 days, apply heat to the injured area. Use heat for the next 2 to 3 days. Place a heating pad on the foot or soak the foot in warm water as directed by your caregiver.  Keep your foot elevated as much as possible to lessen  swelling.  Wear sturdy, supportive shoes. The shoes should not pinch the toes or fit tightly against the toes.  Your caregiver may prescribe a rigid shoe if your foot is very swollen.  Your may be given crutches if the pain is too great and it hurts too much to walk.  Only take over-the-counter or prescription medicines for pain, discomfort, or fever as directed by your caregiver.  If your caregiver has given you a follow-up appointment, it is very important to keep that appointment. Not keeping the appointment could result in a chronic or permanent injury, pain, and disability. If there is any problem keeping the appointment, you must call back to this facility for assistance. SEEK MEDICAL CARE IF:   You have increased pain or swelling, not relieved with medications.  The pain does not get better after 1 week.  Your injured toe is cold when the others are warm. SEEK IMMEDIATE MEDICAL CARE IF:   The toe becomes cold, numb, or white.  The toe becomes hot (inflamed) and red. Document Released: 02/14/2000 Document Revised: 05/11/2011 Document Reviewed: 10/03/2007 [facility] Patient Information 2015 [place], [place]. This information is not intended to replace advice given to you by your health care provider. Make sure you discuss any questions you have with your health care provider. Chest Contusion A chest contusion is a deep bruise on  your chest area. Contusions are the result of an injury that caused bleeding under the skin. A chest contusion may involve bruising of the skin, muscles, or ribs. The contusion may turn blue, purple, or yellow. Minor injuries will give you a painless contusion, but more severe contusions may stay painful and swollen for a few weeks. CAUSES  A contusion is usually caused by a blow, trauma, or direct force to an area of the body. SYMPTOMS   Swelling and redness of the injured area.  Discoloration of the injured area.  Tenderness and soreness of the injured  area.  Pain. DIAGNOSIS  The diagnosis can be made by taking a history and performing a physical exam. An X-ray, CT scan, or MRI may be needed to determine if there were any associated injuries, such as broken bones (fractures) or internal injuries. TREATMENT  Often, the best treatment for a chest contusion is resting, icing, and applying cold compresses to the injured area. Deep breathing exercises may be recommended to reduce the risk of pneumonia. Over-the-counter medicines may also be recommended for pain control. HOME CARE INSTRUCTIONS   Put ice on the injured area.  Put ice in a plastic bag.  Place a towel between your skin and the bag.  Leave the ice on for 15-20 minutes, 03-04 times a day.  Only take over-the-counter or prescription medicines as directed by your caregiver. Your caregiver may recommend avoiding anti-inflammatory medicines (aspirin, ibuprofen, and naproxen) for 48 hours because these medicines may increase bruising.  Rest the injured area.  Perform deep-breathing exercises as directed by your caregiver.  Stop smoking if you smoke.  Do not lift objects over 5 pounds (2.3 kg) for 3 days or longer if recommended by your caregiver. SEEK IMMEDIATE MEDICAL CARE IF:   You have increased bruising or swelling.  You have pain that is getting worse.  You have difficulty breathing.  You have dizziness, weakness, or fainting.  You have blood in your urine or stool.  You cough up or vomit blood.  Your swelling or pain is not relieved with medicines. MAKE SURE YOU:   Understand these instructions.  Will watch your condition.  Will get help right away if you are not doing well or get worse. Document Released: 11/11/2000 Document Revised: 11/11/2011 Document Reviewed: 08/10/2011 [facility] Patient Information 2015 [place], [place]. This information is not intended to replace advice given to you by your health care provider. Make sure you discuss any questions you  have with your health care provider.

## 2013-12-20 NOTE — ED Notes (Signed)
MD  And EMT at bedside.

## 2013-12-20 NOTE — ED Notes (Signed)
Pt ambulated from room into hallway using walker.  Observed by MD. Angelena Sole for DC to home with son.

## 2013-12-22 ENCOUNTER — Telehealth: Payer: Self-pay | Admitting: Family Medicine

## 2013-12-22 DIAGNOSIS — J449 Chronic obstructive pulmonary disease, unspecified: Secondary | ICD-10-CM

## 2013-12-22 NOTE — Telephone Encounter (Signed)
Caller name: [name] Relation to pt:son Call back [place]   Reason for call:  Son was told that before the respiratory therapist will come out from [facility], someone has to call them.  Pt is on oxygen, 3.5 Units with a 25 tube.  They are wanting a portable unit for home.    Pt has office visit here on 11/5.

## 2013-12-25 NOTE — Telephone Encounter (Signed)
Spoke with pts son, states she needs new order for Home Health. Request new oxygen orders, news second oxygen unit.

## 2013-12-25 NOTE — Telephone Encounter (Signed)
I'm not really sure what the problem is-- the referral was put in.  I see she was in the ER a few days ago.

## 2013-12-25 NOTE — Telephone Encounter (Signed)
Please advise      KP 

## 2013-12-26 ENCOUNTER — Encounter: Payer: Medicare Other | Admitting: Family Medicine

## 2013-12-26 NOTE — Telephone Encounter (Signed)
Pt sees pulmonary---  I think it should come from them

## 2013-12-27 NOTE — Telephone Encounter (Signed)
Spoke with Mrs.Febo and made her aware, she will have her son call back.    KP

## 2013-12-29 NOTE — Telephone Encounter (Addendum)
Spoke with Kirsten Flores and he stated he wanted to get an order sent to [facility] for a portable battery operated O2 concentrator because his mother has the oxygen tank with 60 foot cord and she tripped over the cord when he was not home a few weeks ago, he said he has bumped her O2 up to 3.5 because she was not getting enough O2 because the cord was so long. I made him aware that Dr.Lowne wanted him to call the pulmonary provide for the order since he is managing the patient's O2. Kirsten Flores voiced understanding and I gave him the number, I also made him aware I would forward the information to both providers and call if any issues.      KP

## 2014-01-01 ENCOUNTER — Ambulatory Visit: Payer: Medicare Other | Admitting: Emergency Medicine

## 2014-01-01 NOTE — Telephone Encounter (Signed)
OOK to increase O2 and to get a portable concentrator. 3.5L/min

## 2014-01-02 NOTE — Telephone Encounter (Signed)
Spoke with pt-- advised the patient that we are going to place an order to increase her O2 and to get her a POC Pt was unable to understand what I was talking about requested that our office call her back around 4pm to speak with her son Kirsten Flores. She states he will know better why we are calling.  Will hold in triage to call back after 4pm

## 2014-01-03 NOTE — Addendum Note (Signed)
Addended by: Maurice March on: 01/03/2014 02:23 PM   Modules accepted: Orders

## 2014-01-03 NOTE — Telephone Encounter (Signed)
Pt's son, [name], returned call. Informed [name] of the recs per RB. Order placed. [name] is aware that someone will be contacting them to get the O2 set up. [name] verbalized understanding and denied any further questions or concerns at this time.

## 2014-01-04 ENCOUNTER — Encounter: Payer: Self-pay | Admitting: Medical

## 2014-01-04 ENCOUNTER — Ambulatory Visit: Payer: Medicare Other | Admitting: Family Medicine

## 2014-01-04 ENCOUNTER — Ambulatory Visit (INDEPENDENT_AMBULATORY_CARE_PROVIDER_SITE_OTHER): Payer: Medicare Other | Admitting: Medical

## 2014-01-04 VITALS — BP 115/69 | HR 69 | Temp 98.5°F | Wt 255.0 lb

## 2014-01-04 DIAGNOSIS — S91119D Laceration without foreign body of unspecified toe without damage to nail, subsequent encounter: Secondary | ICD-10-CM

## 2014-01-04 DIAGNOSIS — R600 Localized edema: Secondary | ICD-10-CM

## 2014-01-04 DIAGNOSIS — T148 Other injury of unspecified body region: Secondary | ICD-10-CM

## 2014-01-04 DIAGNOSIS — T148XXA Other injury of unspecified body region, initial encounter: Secondary | ICD-10-CM

## 2014-01-04 DIAGNOSIS — S91119A Laceration without foreign body of unspecified toe without damage to nail, initial encounter: Secondary | ICD-10-CM | POA: Insufficient documentation

## 2014-01-04 NOTE — Progress Notes (Signed)
Subjective:    Patient ID: Kirsten Flores, female    DOB: 07/21/1923, 80 y.o.   MRN: [id number]  HPI   Pt in for follow up. Pt seen in the ED October 21st. For fall. Only mild small laceration rt 3rd  toe area. That area healed.   I reviewed mechanism of fall. Tube got caught in her walker. No loc.   Pt is getting new shorter tubing and batter powered unit for 02.   Pt has some swelling in her legs. Little more on rt side.  Also some scattered bruising rt arm and legs. Some waist. These are from fall per son. Pt states brusing now better. Son was not aware until I examined her.  Her rt foot/leg swelling also a lot better.Pt had some varicose vein procedures, and supposed to use ted hose stocking but does   Past Medical History  Diagnosis Date  . Hyperlipidemia   . Hypertension   . Osteopenia   . Thyroid disease   . B12 deficiency   . Arthritis   . Memory loss   . Knee pain     RIGHT  . Gait disturbance     History   Social History  . Marital Status: Widowed    Spouse Name: N/A    Number of Children: N/A  . Years of Education: N/A   Occupational History  . RETIRED NURSE    Social History Main Topics  . Smoking status: Former Smoker -- 2.00 packs/day for 25 years    Types: Cigarettes    Quit date: 03/03/1971  . Smokeless tobacco: Never Used  . Alcohol Use: Yes     Comment: 1 cocktail in the evening and a glass of wine - not everyday but doesn't state how often  . Drug Use: No  . Sexual Activity: Not on file   Other Topics Concern  . Not on file   Social History Narrative   Olam Idler DIED IN Dec 26, 2009    Past Surgical History  Procedure Laterality Date  . Appendectomy    . Cholecystectomy    . Abdominal hysterectomy    . Oophorectomy      Family History  Problem Relation Age of Onset  . Colon cancer    . Melanoma    . Cancer Other     COLON...1ST DEGREE RELATIVE    Allergies  Allergen Reactions  . Diltiazem Hcl Other (See Comments)    Unknown  reaction  . Neomycin Other (See Comments)    Unknown reaction (a long time ago)    Current Outpatient Prescriptions on File Prior to Visit  Medication Sig Dispense Refill  . acetaminophen (TYLENOL ARTHRITIS PAIN) 650 MG CR tablet Take 1,300 mg by mouth 2 (two) times daily.    Marland Kitchen atenolol (TENORMIN) 25 MG tablet Take 1 tablet (25 mg total) by mouth daily. 90 tablet 1  . atorvastatin (LIPITOR) 10 MG tablet     . betaxolol (BETOPTIC-S) 0.25 % ophthalmic suspension Place 1 drop into both eyes 2 (two) times daily.      . clopidogrel (PLAVIX) 75 MG tablet Take 1 tablet (75 mg total) by mouth daily with breakfast. 90 tablet 3  . ezetimibe-simvastatin (VYTORIN) 10-20 MG per tablet Take 1 tablet by mouth every evening.    . gabapentin (NEURONTIN) 600 MG tablet Take 1 tablet (600 mg total) by mouth at bedtime. 90 tablet 3  . HYDROcodone-acetaminophen (NORCO/VICODIN) 5-325 MG per tablet Take 1 tablet by mouth every 6 (six) hours  as needed for moderate pain. 15 tablet 0  . levothyroxine (SYNTHROID, LEVOTHROID) 125 MCG tablet Take 1 tablet (125 mcg total) by mouth daily. 90 tablet 0  . pantoprazole (PROTONIX) 40 MG tablet Take 1 tablet (40 mg total) by mouth daily. 90 tablet 3  . potassium chloride SA (K-DUR,KLOR-CON) 20 MEQ tablet Take 1 tablet (20 mEq total) by mouth daily. 90 tablet 1  . sertraline (ZOLOFT) 50 MG tablet Take 1 tablet (50 mg total) by mouth daily. 90 tablet 3  . Tiotropium Bromide Monohydrate (SPIRIVA RESPIMAT) 2.5 MCG/ACT AERS Inhale 1 puff into the lungs daily. 1 Inhaler 6  . tolterodine (DETROL LA) 4 MG 24 hr capsule Take 1 capsule (4 mg total) by mouth daily. 90 capsule 3  . valsartan-hydrochlorothiazide (DIOVAN-HCT) 160-25 MG per tablet Take 0.5 tablets by mouth daily. 90 tablet 3  . [DISCONTINUED] tolterodine (DETROL LA) 4 MG 24 hr capsule Take 1 capsule (4 mg total) by mouth daily. 90 capsule 0   No current facility-administered medications on file prior to visit.    BP 115/69  mmHg  Pulse 69  Temp(Src) 98.5 F (36.9 C) (Oral)  Wt 255 lb (115.667 kg)  SpO2 93%       Review of Systems  Constitutional: Negative for fever, chills, diaphoresis and fatigue.  HENT: Negative.   Respiratory: Negative for cough, choking, chest tightness, shortness of breath and wheezing.   Cardiovascular: Negative for chest pain, palpitations and leg swelling.  Gastrointestinal: Negative.   Genitourinary: Negative.   Musculoskeletal: Negative.        Some pedal edema recently. No popliteal pain.  Skin:       Rt 3rd toe lacertion healed.  Bruising from fall rapidy resolving.  Neurological: Negative.   Hematological: Negative for adenopathy. Does not bruise/bleed easily.       Objective:   Physical Exam General- no acute distress, pleasant. Lungs- CTA Heart- RRR Skin- Rt elbow bruise small and faint. No extensive brusing. Musculoskeletal- Dry skin to feet. Rt leg faint/mild more swollen compared to left. No warmth no tenderness. Negative homans sings. Rt foot-4th toe very short. Flat feet.3rd toe laceration healed.       Assessment & Plan:

## 2014-01-04 NOTE — Assessment & Plan Note (Signed)
Rt 3rd toe. On inspection today the area is completely healed now. Follow up from the ED.

## 2014-01-04 NOTE — Patient Instructions (Addendum)
Your bruising is resolving from prior  trauma. You declined cbc since your are convinced no abnormal bruising has taken place since the fall. Also the areas that your describe looked like bruises are reabsorbing. If any new bruises occur no reason notify us and would get cbc.  Regarding your mild rt lower ext swelling, I offered a doppler but you declined. If you get any popliteal pain at any point then ED evaluation. Elevate you legs and if possible use your stocking if possible.  Follow up in 3 wks  pcp or as needed.

## 2014-01-04 NOTE — Progress Notes (Signed)
Pre visit review using our clinic review tool, if applicable. No additional management support is needed unless otherwise documented below in the visit note. 

## 2014-01-04 NOTE — Assessment & Plan Note (Signed)
Normal variant from the fall and resolving. If the brusing gets worsens or new without cause then will get cbc. Pt declines cbc today.

## 2014-01-04 NOTE — Assessment & Plan Note (Signed)
Likley dependant edema. Not much difference between both side. Pt has not popliteal pain. She declines any doppler of rt side. I advised if swelling worsens or if any popliteal pain notify us and would get doppler. If any such changes after hours then UC or ED evaluation. Pt agreed.  Elevate legs for now and use stockings.

## 2014-01-10 ENCOUNTER — Telehealth: Payer: Self-pay | Admitting: Emergency Medicine

## 2014-01-10 NOTE — Telephone Encounter (Signed)
I have sent Kirsten Flores w/ [facility] a staff message checking on status of order.  I called pt and made aware we are checking on this and will call with update when we hear from [facility]

## 2014-01-11 NOTE — Telephone Encounter (Signed)
Spoke with pt and advised that per [facility] from [facility] order is being processed and she should be hearing from [facility] regarding the oxygen.  Pt verbalized understanding.

## 2014-01-11 NOTE — Telephone Encounter (Signed)
Kirsten Flores has looked back through her staff message and can not find an order for this.  She is going to go ahead pull order and get this started.

## 2014-01-30 ENCOUNTER — Ambulatory Visit (INDEPENDENT_AMBULATORY_CARE_PROVIDER_SITE_OTHER): Payer: Medicare Other | Admitting: Emergency Medicine

## 2014-01-30 ENCOUNTER — Encounter: Payer: Self-pay | Admitting: Emergency Medicine

## 2014-01-30 VITALS — BP 128/70 | HR 83 | Ht 65.0 in | Wt 217.0 lb

## 2014-01-30 DIAGNOSIS — J449 Chronic obstructive pulmonary disease, unspecified: Secondary | ICD-10-CM

## 2014-01-30 NOTE — Progress Notes (Signed)
Subjective:    Patient ID: Kirsten Flores, female    DOB: November 02, 1923, 80 y.o.   MRN: [id number]  HPI 80 yo woman, former smoker (37 pk-yrs), HTN diastolic dysfxn, mild PAH, hyperlipidemia, arthritis. She was recently admitted for a near syncopal episode. CT chest at that time showed no PE, possible mild GG infiltrate that improved through the hospitalization. They started o2 at the hospital. She was treated with diuresis, fluid restriction. She describes SOB that showed up with walking. Her son helps transport her.  She slipped and fell last night, EMS came to help.  She hears occasional cough, never wheezes.   ROV 09/14/13 -- follows for multifactorial dyspnea, hypoxemia, suspected COPD based on hx and spirometry. She started spiriva respimat last time, not sure whether it has helped her. She believes that the o2 has helped her more. She has the help of her son. She gets around in the house with her O2 on but still has limitations.   ROV 01/30/14 -- follow up for COPD, hypoxemia. We have treated with Spiriva but she stopped about 2 months ago. Not sure that she misses it. She is wearing her oxygen reliably - causes her nose to drip. She is working to get a Marine scientist, 3.5L/min.    Review of Systems  Constitutional: Negative for fever and unexpected weight change.  HENT: Positive for postnasal drip. Negative for congestion, dental problem, ear pain, nosebleeds, rhinorrhea, sinus pressure, sneezing, sore throat and trouble swallowing.   Eyes: Negative for redness and itching.  Respiratory: Negative for cough, chest tightness, shortness of breath and wheezing.   Cardiovascular: Positive for leg swelling. Negative for palpitations.       Ankles and feet  Gastrointestinal: Negative for nausea and vomiting.  Genitourinary: Negative for dysuria.  Musculoskeletal: Negative for joint swelling.  Skin: Negative for rash.  Neurological: Negative for headaches.  Hematological: Does not  bruise/bleed easily.  Psychiatric/Behavioral: Negative for dysphoric mood. The patient is not nervous/anxious.        Objective:   Physical Exam Filed Vitals:   01/30/14 1611  BP: 128/70  Pulse: 83  Height: 5\' 5"  (1.651 m)  Weight: 217 lb (98.431 kg)  SpO2: 90%   Gen: Pleasant, obese woman in wheelchair, in no distress,  normal affect  ENT: No lesions,  mouth clear,  oropharynx clear, no postnasal drip  Neck: No JVD, no TMG, no carotid bruits  Lungs: No use of accessory muscles, distant but clear without rales or rhonchi  Cardiovascular: RRR, heart sounds normal, no murmur or gallops, no peripheral edema  Musculoskeletal: No deformities, no cyanosis or clubbing  Neuro: alert, non focal  Skin: Warm, no lesions or rashes    TTE 07/21/13 --  Study Conclusions - Left ventricle: The cavity size was normal. Wall thickness was increased in a pattern of mild LVH. Systolic function was normal. The estimated ejection fraction was in the range of 55% to 60%. Wall motion was normal; there were no regional wall motion abnormalities. Doppler parameters are consistent with abnormal left ventricular relaxation (grade 1 diastolic dysfunction). - Aortic valve: There was trivial regurgitation. - Mitral valve: Calcified annulus. - Left atrium: The atrium was mildly dilated. - Right ventricle: The cavity size was mildly dilated. Wall thickness was normal  CT scan 07/20/13 --  COMPARISON: Chest CT April 18, 2004 and chest radiograph Jul 20, 2013  FINDINGS:  There is no demonstrable pulmonary embolus. There is no thoracic  aortic aneurysm or dissection. There  is atherosclerotic change in  the aorta. There is calcification in the mitral annulus.  Visualized upper abdominal structures appear unremarkable. There is  degenerative change in the thoracic spine. There are no blastic or  lytic bone lesions appreciable. No thyroid lesions are identified.  There is underlying centrilobular  emphysema. There are scattered  areas of scarring in both upper and lower lobes. There is no frank  edema or consolidation. Patchy atelectatic change bilaterally is  present. There are scattered 1-2 mm nodular opacities in each upper  lobe. No larger pulmonary nodular opacities are identified.  There is no appreciable thoracic adenopathy. The pericardium is not  thickened.  Visualized upper abdominal structures appear normal. There are no  blastic or lytic bone lesions. Thyroid appears unremarkable.  Review of the MIP images confirms the above findings.  IMPRESSION:  No demonstrable pulmonary embolus. Mild reticular interstitial  disease. Underlying emphysema with areas of scarring and patchy  atelectasis. No edema or consolidation. No adenopathy.      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 This is likely the most important contribution we can make. She is working on getting up or blocks and concentrator rov 6

## 2014-01-30 NOTE — Patient Instructions (Signed)
Please continue your oxygen at all times. Call our office if you need any help obtaining your portable oxygen concentrator Follow with Dr Lamonte Sakai in 6 months or sooner if you have any problems

## 2014-01-30 NOTE — Assessment & Plan Note (Signed)
We will not restart any bronchodilators at this time as she does not believe she has missed the Spiriva Continue oxygen therapy. This is likely the most important contribution we can make. She is working on getting up or blocks and concentrator rov 6

## 2014-03-09 ENCOUNTER — Other Ambulatory Visit: Payer: Self-pay | Admitting: Family Medicine

## 2014-03-12 ENCOUNTER — Other Ambulatory Visit: Payer: Self-pay

## 2014-03-12 MED ORDER — LEVOTHYROXINE SODIUM 125 MCG PO TABS: 125.0000 ug | ORAL_TABLET | Freq: Every day | ORAL | Status: DC

## 2014-03-24 ENCOUNTER — Other Ambulatory Visit: Payer: Self-pay | Admitting: Family Medicine

## 2014-04-30 ENCOUNTER — Ambulatory Visit: Payer: Medicare Other | Admitting: Family Medicine

## 2014-05-01 ENCOUNTER — Ambulatory Visit (INDEPENDENT_AMBULATORY_CARE_PROVIDER_SITE_OTHER): Payer: Medicare Other | Admitting: Family Medicine

## 2014-05-01 ENCOUNTER — Encounter: Payer: Self-pay | Admitting: Family Medicine

## 2014-05-01 VITALS — BP 128/60 | HR 67 | Temp 98.3°F | Wt 211.8 lb

## 2014-05-01 DIAGNOSIS — R21 Rash and other nonspecific skin eruption: Secondary | ICD-10-CM

## 2014-05-01 DIAGNOSIS — I10 Essential (primary) hypertension: Secondary | ICD-10-CM

## 2014-05-01 DIAGNOSIS — E039 Hypothyroidism, unspecified: Secondary | ICD-10-CM

## 2014-05-01 DIAGNOSIS — E785 Hyperlipidemia, unspecified: Secondary | ICD-10-CM

## 2014-05-01 NOTE — Progress Notes (Signed)
Pre visit review using our clinic review tool, if applicable. No additional management support is needed unless otherwise documented below in the visit note. 

## 2014-05-01 NOTE — Patient Instructions (Signed)

## 2014-05-02 LAB — CBC WITH DIFFERENTIAL/PLATELET
BASOS PCT: 0.3 % (ref 0.0–3.0)
Basophils Absolute: 0 10*3/uL (ref 0.0–0.1)
Eosinophils Absolute: 0.1 10*3/uL (ref 0.0–0.7)
Eosinophils Relative: 2.1 % (ref 0.0–5.0)
HCT: 36 % (ref 36.0–46.0)
Hemoglobin: 11.9 g/dL — ABNORMAL LOW (ref 12.0–15.0)
Lymphocytes Relative: 28.3 % (ref 12.0–46.0)
Lymphs Abs: 1.5 10*3/uL (ref 0.7–4.0)
MCHC: 33 g/dL (ref 30.0–36.0)
MCV: 100 fl (ref 78.0–100.0)
MONO ABS: 0.6 10*3/uL (ref 0.1–1.0)
Monocytes Relative: 10.4 % (ref 3.0–12.0)
Neutro Abs: 3.1 10*3/uL (ref 1.4–7.7)
Neutrophils Relative %: 58.9 % (ref 43.0–77.0)
Platelets: 150 10*3/uL (ref 150.0–400.0)
RBC: 3.6 Mil/uL — AB (ref 3.87–5.11)
RDW: 12.9 % (ref 11.5–15.5)
WBC: 5.3 10*3/uL (ref 4.0–10.5)

## 2014-05-02 LAB — BASIC METABOLIC PANEL
BUN: 28 mg/dL — ABNORMAL HIGH (ref 6–23)
CHLORIDE: 108 meq/L (ref 96–112)
CO2: 34 mEq/L — ABNORMAL HIGH (ref 19–32)
Calcium: 10.1 mg/dL (ref 8.4–10.5)
Creatinine, Ser: 1.12 mg/dL (ref 0.40–1.20)
GFR: 48.68 mL/min — ABNORMAL LOW (ref >60.00)
Glucose, Bld: 91 mg/dL (ref 70–99)
POTASSIUM: 4.7 meq/L (ref 3.5–5.1)
SODIUM: 145 meq/L (ref 135–145)

## 2014-05-02 LAB — LIPID PANEL
CHOL/HDL RATIO: 3
Cholesterol: 117 mg/dL (ref 0–200)
HDL: 38.1 mg/dL — AB (ref >39.00)
LDL Cholesterol: 50 mg/dL (ref 0–99)
NONHDL: 78.9
Triglycerides: 146 mg/dL (ref 0.0–149.0)
VLDL: 29.2 mg/dL (ref 0.0–40.0)

## 2014-05-02 LAB — HEPATIC FUNCTION PANEL
ALK PHOS: 33 U/L — AB (ref 39–117)
ALT: 11 U/L (ref 0–35)
AST: 21 U/L (ref 0–37)
Albumin: 4 g/dL (ref 3.5–5.2)
BILIRUBIN DIRECT: 0.2 mg/dL (ref 0.0–0.3)
BILIRUBIN TOTAL: 0.7 mg/dL (ref 0.2–1.2)
Total Protein: 6.5 g/dL (ref 6.0–8.3)

## 2014-05-02 NOTE — Progress Notes (Addendum)
Subjective:    Patient ID: Kirsten Flores, female    DOB: [birth year], 81 y.o.   MRN: [id number]  HPI  Patient here for f/u thyroid, htn,hyperlipidemia-- she is also c/o rash on chest that has been there for weeks-- it is painful.  She is already on gabapentin.    Past Medical History  Diagnosis Date  . Hyperlipidemia   . Hypertension   . Osteopenia   . Thyroid disease   . B12 deficiency   . Arthritis   . Memory loss   . Knee pain     RIGHT  . Gait disturbance    History   Social History  . Marital Status: Widowed    Spouse Name: N/A  . Number of Children: N/A  . Years of Education: N/A   Occupational History  . RETIRED NURSE    Social History Main Topics  . Smoking status: Former Smoker -- 2.00 packs/day for 25 years    Types: Cigarettes    Quit date: 03/03/1971  . Smokeless tobacco: Never Used  . Alcohol Use: Yes     Comment: 1 cocktail in the evening and a glass of wine - not everyday but doesn't state how often  . Drug Use: No  . Sexual Activity: Not on file   Other Topics Concern  . Not on file   Social History Narrative   HUSBAND DIED IN 2010/01/06   Current Outpatient Prescriptions  Medication Sig Dispense Refill  . acetaminophen (TYLENOL ARTHRITIS PAIN) 650 MG CR tablet Take 1,300 mg by mouth 2 (two) times daily.    Marland Kitchen atenolol (TENORMIN) 25 MG tablet Take 1 tablet (25 mg total) by mouth daily. 90 tablet 1  . atorvastatin (LIPITOR) 10 MG tablet Take 10 mg by mouth daily.     Marland Kitchen atorvastatin (LIPITOR) 10 MG tablet TAKE 1 BY MOUTH DAILY -- APPOINTMENT AND LABS NEEDED 90 tablet 0  . betaxolol (BETOPTIC-S) 0.25 % ophthalmic suspension Place 1 drop into both eyes 2 (two) times daily.      . clopidogrel (PLAVIX) 75 MG tablet Take 1 tablet (75 mg total) by mouth daily with breakfast. 90 tablet 3  . ezetimibe-simvastatin (VYTORIN) 10-20 MG per tablet Take 1 tablet by mouth every evening.    . fenofibrate (TRICOR) 145 MG tablet TAKE 1 BY MOUTH DAILY 90 tablet 0  .  gabapentin (NEURONTIN) 600 MG tablet Take 1 tablet (600 mg total) by mouth at bedtime. 90 tablet 3  . HYDROcodone-acetaminophen (NORCO/VICODIN) 5-325 MG per tablet Take 1 tablet by mouth every 6 (six) hours as needed for moderate pain. 15 tablet 0  . levothyroxine (SYNTHROID, LEVOTHROID) 125 MCG tablet Take 1 tablet (125 mcg total) by mouth daily. 90 tablet 0  . pantoprazole (PROTONIX) 40 MG tablet Take 1 tablet (40 mg total) by mouth daily. 90 tablet 3  . potassium chloride SA (K-DUR,KLOR-CON) 20 MEQ tablet TAKE 1 BY MOUTH DAILY 90 tablet 0  . sertraline (ZOLOFT) 50 MG tablet Take 1 tablet (50 mg total) by mouth daily. 90 tablet 3  . tolterodine (DETROL LA) 4 MG 24 hr capsule Take 1 capsule (4 mg total) by mouth daily. 90 capsule 3  . valsartan-hydrochlorothiazide (DIOVAN-HCT) 160-25 MG per tablet Take 0.5 tablets by mouth daily. 90 tablet 3  . [DISCONTINUED] tolterodine (DETROL LA) 4 MG 24 hr capsule Take 1 capsule (4 mg total) by mouth daily. 90 capsule 0   No current facility-administered medications for this visit.    Review of  Systems  Constitutional: Positive for fatigue. Negative for activity change, appetite change and unexpected weight change.  Respiratory: Negative for cough and shortness of breath.   Cardiovascular: Negative for chest pain and palpitations.  Skin: Positive for color change and rash.  Psychiatric/Behavioral: Negative for behavioral problems and dysphoric mood. The patient is not nervous/anxious.        Objective:    Physical Exam  Constitutional: She is oriented to person, place, and time. She appears well-developed and well-nourished. No distress.  HENT:  Right Ear: External ear normal.  Left Ear: External ear normal.  Nose: Nose normal.  Mouth/Throat: Oropharynx is clear and moist.  Eyes: EOM are normal. Pupils are equal, round, and reactive to light.  Neck: Normal range of motion. Neck supple.  Cardiovascular: Normal rate and regular rhythm.   Murmur  heard. Pulmonary/Chest: Effort normal and breath sounds normal. No respiratory distress. She has no wheezes. She has no rales. She exhibits no tenderness.  Neurological: She is alert and oriented to person, place, and time.  Skin: Rash noted. There is erythema.  Psychiatric: She has a normal mood and affect. Her behavior is normal. Judgment and thought content normal.    BP 128/60 mmHg  Pulse 67  Temp(Src) 98.3 F (36.8 C) (Oral)  Wt 211 lb 12.8 oz (96.072 kg)  SpO2 94% Wt Readings from Last 3 Encounters:  05/01/14 211 lb 12.8 oz (96.072 kg)  01/30/14 217 lb (98.431 kg)  01/04/14 255 lb (115.667 kg)     Lab Results  Component Value Date   WBC 5.3 05/01/2014   HGB 11.9* 05/01/2014   HCT 36.0 05/01/2014   PLT 150.0 05/01/2014   GLUCOSE 91 05/01/2014   CHOL 117 05/01/2014   TRIG 146.0 05/01/2014   HDL 38.10* 05/01/2014   LDLDIRECT 112.2 12/23/2009   LDLCALC 50 05/01/2014   ALT 11 05/01/2014   AST 21 05/01/2014   NA 145 05/01/2014   K 4.7 05/01/2014   CL 108 05/01/2014   CREATININE 1.12 05/01/2014   BUN 28* 05/01/2014   CO2 34* 05/01/2014   TSH 0.53 10/30/2013    Dg Chest 2 View  12/20/2013   CLINICAL DATA:  Fall.  Chest pain  EXAM: CHEST  2 VIEW  COMPARISON:  07/27/2013  FINDINGS: Prominent heart size without heart failure. Negative for pneumonia or effusion. Mild right lower lobe atelectasis.  IMPRESSION: Cardiac enlargement.  Mild right lower lobe atelectasis.   Electronically Signed   By: Franchot Gallo M.D.   On: 12/20/2013 12:52   Dg Foot Complete Right  12/20/2013   CLINICAL DATA:  False morning with soft tissue lacerations and pain  EXAM: RIGHT FOOT COMPLETE - 3+ VIEW  COMPARISON:  None.  FINDINGS: There are changes in the distal aspect of the third metatarsal which appear to be related to prior fracture with some degree of healing. Correlation to point tenderness is recommended. Irregularity at the distal aspect of the first proximal phalanx and at the base of  the second proximal phalanx is noted. These may represent mildly displaced fractures. Generalized osteopenia and degenerative changes noted within the tarsal bones. Vascular calcifications are seen.  IMPRESSION: Changes in the first and second proximal phalanges as described which may represent acute fractures. Correlation to point tenderness is recommended.  Changes of Prior fracture in the distal third metatarsal.   Electronically Signed   By: Inez Catalina M.D.   On: 12/20/2013 14:22       Assessment & Plan:  Problem List Items Addressed This Visit    None    Visit Diagnoses    Rash and nonspecific skin eruption    -  Primary    Essential hypertension        Relevant Orders    Basic metabolic panel (Completed)    CBC with Differential/Platelet (Completed)    Hyperlipidemia        Relevant Orders    Hepatic function panel (Completed)    Lipid panel (Completed)    Hypothyroidism, unspecified hypothyroidism type        Relevant Orders    TSH     since this ov the patient has been in the hospital 2x and is home bound. The patient requires a semi-electric hospital bed, hoyer lift, trapeze bar and alternating pressure pad and pump.  The patient has aki, copd, htn , hyperlipidemia, memory loss, pressure ulcers, weakness, frequent falls which requires her to be positioned in ways not feasible with a normal bed.  Patient also requires frequent repositioning.  Head must be elevated at least 30 degrees to prevent aspiration and help with breathing.     Garnet Koyanagi, DO

## 2014-05-25 ENCOUNTER — Other Ambulatory Visit: Payer: Self-pay | Admitting: Family Medicine

## 2014-06-22 ENCOUNTER — Other Ambulatory Visit: Payer: Self-pay | Admitting: Family Medicine

## 2014-06-25 MED ORDER — LEVOTHYROXINE SODIUM 125 MCG PO TABS: 125.0000 ug | ORAL_TABLET | Freq: Every day | ORAL | Status: DC

## 2014-07-02 ENCOUNTER — Other Ambulatory Visit: Payer: Self-pay | Admitting: Family Medicine

## 2014-07-11 ENCOUNTER — Emergency Department (HOSPITAL_COMMUNITY): Payer: Medicare Other

## 2014-07-11 ENCOUNTER — Encounter (HOSPITAL_COMMUNITY): Payer: Self-pay | Admitting: Emergency Medicine

## 2014-07-11 ENCOUNTER — Inpatient Hospital Stay (HOSPITAL_COMMUNITY)
Admission: EM | Admit: 2014-07-11 | Discharge: 2014-07-14 | DRG: 689 | Disposition: A | Discharge status: Home Health | Payer: Medicare Other | Attending: Internal Medicine | Admitting: Internal Medicine

## 2014-07-11 ENCOUNTER — Other Ambulatory Visit: Payer: Self-pay

## 2014-07-11 ENCOUNTER — Other Ambulatory Visit (HOSPITAL_COMMUNITY): Payer: Self-pay

## 2014-07-11 DIAGNOSIS — R531 Weakness: Secondary | ICD-10-CM | POA: Diagnosis present

## 2014-07-11 DIAGNOSIS — E785 Hyperlipidemia, unspecified: Secondary | ICD-10-CM | POA: Diagnosis present

## 2014-07-11 DIAGNOSIS — I350 Nonrheumatic aortic (valve) stenosis: Secondary | ICD-10-CM | POA: Diagnosis present

## 2014-07-11 DIAGNOSIS — Z800 Family history of malignant neoplasm of digestive organs: Secondary | ICD-10-CM | POA: Diagnosis not present

## 2014-07-11 DIAGNOSIS — R011 Cardiac murmur, unspecified: Secondary | ICD-10-CM

## 2014-07-11 DIAGNOSIS — E875 Hyperkalemia: Secondary | ICD-10-CM | POA: Diagnosis present

## 2014-07-11 DIAGNOSIS — E860 Dehydration: Secondary | ICD-10-CM | POA: Diagnosis present

## 2014-07-11 DIAGNOSIS — E870 Hyperosmolality and hypernatremia: Secondary | ICD-10-CM | POA: Diagnosis present

## 2014-07-11 DIAGNOSIS — E039 Hypothyroidism, unspecified: Secondary | ICD-10-CM | POA: Diagnosis present

## 2014-07-11 DIAGNOSIS — R269 Unspecified abnormalities of gait and mobility: Secondary | ICD-10-CM | POA: Diagnosis present

## 2014-07-11 DIAGNOSIS — B9620 Unspecified Escherichia coli [E. coli] as the cause of diseases classified elsewhere: Secondary | ICD-10-CM | POA: Diagnosis present

## 2014-07-11 DIAGNOSIS — N390 Urinary tract infection, site not specified: Secondary | ICD-10-CM | POA: Diagnosis present

## 2014-07-11 DIAGNOSIS — Z79899 Other long term (current) drug therapy: Secondary | ICD-10-CM

## 2014-07-11 DIAGNOSIS — I10 Essential (primary) hypertension: Secondary | ICD-10-CM | POA: Diagnosis present

## 2014-07-11 DIAGNOSIS — Z9981 Dependence on supplemental oxygen: Secondary | ICD-10-CM | POA: Diagnosis not present

## 2014-07-11 DIAGNOSIS — Z87891 Personal history of nicotine dependence: Secondary | ICD-10-CM

## 2014-07-11 DIAGNOSIS — F329 Major depressive disorder, single episode, unspecified: Secondary | ICD-10-CM | POA: Diagnosis present

## 2014-07-11 DIAGNOSIS — Z6841 Body Mass Index (BMI) 40.0 and over, adult: Secondary | ICD-10-CM

## 2014-07-11 DIAGNOSIS — R413 Other amnesia: Secondary | ICD-10-CM | POA: Diagnosis present

## 2014-07-11 DIAGNOSIS — Z8249 Family history of ischemic heart disease and other diseases of the circulatory system: Secondary | ICD-10-CM

## 2014-07-11 DIAGNOSIS — G9340 Encephalopathy, unspecified: Secondary | ICD-10-CM

## 2014-07-11 DIAGNOSIS — F0390 Unspecified dementia without behavioral disturbance: Secondary | ICD-10-CM | POA: Diagnosis present

## 2014-07-11 DIAGNOSIS — J449 Chronic obstructive pulmonary disease, unspecified: Secondary | ICD-10-CM | POA: Diagnosis present

## 2014-07-11 LAB — CBC WITH DIFFERENTIAL/PLATELET
Basophils Absolute: 0 10*3/uL (ref 0.0–0.1)
Basophils Relative: 0 % (ref 0–1)
EOS ABS: 0.1 10*3/uL (ref 0.0–0.7)
Eosinophils Relative: 2 % (ref 0–5)
HEMATOCRIT: 42.3 % (ref 36.0–46.0)
Hemoglobin: 12.2 g/dL (ref 12.0–15.0)
LYMPHS PCT: 22 % (ref 12–46)
Lymphs Abs: 1.1 10*3/uL (ref 0.7–4.0)
MCH: 32.5 pg (ref 26.0–34.0)
MCHC: 28.8 g/dL — ABNORMAL LOW (ref 30.0–36.0)
MCV: 112.8 fL — AB (ref 78.0–100.0)
Monocytes Absolute: 0.4 10*3/uL (ref 0.1–1.0)
Monocytes Relative: 7 % (ref 3–12)
Neutro Abs: 3.4 10*3/uL (ref 1.7–7.7)
Neutrophils Relative %: 69 % (ref 43–77)
Platelets: 159 10*3/uL (ref 150–400)
RBC: 3.75 MIL/uL — ABNORMAL LOW (ref 3.87–5.11)
RDW: 14.2 % (ref 11.5–15.5)
WBC: 5 10*3/uL (ref 4.0–10.5)

## 2014-07-11 LAB — URINALYSIS, ROUTINE W REFLEX MICROSCOPIC
Glucose, UA: NEGATIVE mg/dL
HGB URINE DIPSTICK: NEGATIVE
Ketones, ur: NEGATIVE mg/dL
Leukocytes, UA: NEGATIVE
Nitrite: POSITIVE — AB
PROTEIN: NEGATIVE mg/dL
Specific Gravity, Urine: 1.024 (ref 1.005–1.030)
UROBILINOGEN UA: 1 mg/dL (ref 0.0–1.0)
pH: 5.5 (ref 5.0–8.0)

## 2014-07-11 LAB — URINE MICROSCOPIC-ADD ON

## 2014-07-11 LAB — COMPREHENSIVE METABOLIC PANEL
ALT: 17 U/L (ref 14–54)
ANION GAP: 6 (ref 5–15)
AST: 27 U/L (ref 15–41)
Albumin: 3.5 g/dL (ref 3.5–5.0)
Alkaline Phosphatase: 42 U/L (ref 38–126)
BUN: 30 mg/dL — ABNORMAL HIGH (ref 6–20)
CO2: 35 mmol/L — AB (ref 22–32)
CREATININE: 0.96 mg/dL (ref 0.44–1.00)
Calcium: 9.7 mg/dL (ref 8.9–10.3)
Chloride: 106 mmol/L (ref 101–111)
GFR, EST AFRICAN AMERICAN: 59 mL/min — AB (ref >60)
GFR, EST NON AFRICAN AMERICAN: 51 mL/min — AB (ref >60)
Glucose, Bld: 113 mg/dL — ABNORMAL HIGH (ref 70–99)
Potassium: 5.2 mmol/L — ABNORMAL HIGH (ref 3.5–5.1)
Sodium: 147 mmol/L — ABNORMAL HIGH (ref 135–145)
TOTAL PROTEIN: 6.3 g/dL — AB (ref 6.5–8.1)
Total Bilirubin: 1.2 mg/dL (ref 0.3–1.2)

## 2014-07-11 LAB — BRAIN NATRIURETIC PEPTIDE: B NATRIURETIC PEPTIDE 5: 282.5 pg/mL — AB (ref 0.0–100.0)

## 2014-07-11 LAB — I-STAT CG4 LACTIC ACID, ED: Lactic Acid, Venous: 0.57 mmol/L (ref 0.5–2.0)

## 2014-07-11 LAB — TSH: TSH: 12.595 u[IU]/mL — ABNORMAL HIGH (ref 0.350–4.500)

## 2014-07-11 MED ORDER — HYDROCODONE-ACETAMINOPHEN 5-325 MG PO TABS: 1.0000 | ORAL_TABLET | Freq: Four times a day (QID) | ORAL | Status: DC | PRN

## 2014-07-11 MED ORDER — ACETAMINOPHEN 650 MG RE SUPP: 650.0000 mg | Freq: Four times a day (QID) | RECTAL | Status: DC | PRN

## 2014-07-11 MED ORDER — HYDROCHLOROTHIAZIDE 12.5 MG PO CAPS
12.5000 mg | ORAL_CAPSULE | Freq: Every day | ORAL | Status: DC
  Administered 2014-07-11 – 2014-07-14 (×4): 12.5 mg via ORAL
  Filled 2014-07-11 (×4): qty 1

## 2014-07-11 MED ORDER — FESOTERODINE FUMARATE ER 8 MG PO TB24
8.0000 mg | ORAL_TABLET | Freq: Every day | ORAL | Status: DC
  Administered 2014-07-11 – 2014-07-14 (×4): 8 mg via ORAL
  Filled 2014-07-11 (×4): qty 1

## 2014-07-11 MED ORDER — SODIUM POLYSTYRENE SULFONATE 15 GM/60ML PO SUSP
15.0000 g | Freq: Once | ORAL | Status: AC
  Administered 2014-07-11: 15 g via ORAL
  Filled 2014-07-11: qty 60

## 2014-07-11 MED ORDER — SODIUM CHLORIDE 0.9 % IV SOLN: INTRAVENOUS | Status: DC

## 2014-07-11 MED ORDER — GABAPENTIN 300 MG PO CAPS
600.0000 mg | ORAL_CAPSULE | Freq: Every day | ORAL | Status: DC
  Administered 2014-07-11 – 2014-07-13 (×3): 600 mg via ORAL
  Filled 2014-07-11 (×4): qty 2

## 2014-07-11 MED ORDER — LEVOTHYROXINE SODIUM 125 MCG PO TABS
125.0000 ug | ORAL_TABLET | Freq: Every day | ORAL | Status: DC
  Administered 2014-07-12: 125 ug via ORAL
  Filled 2014-07-11 (×2): qty 1

## 2014-07-11 MED ORDER — PANTOPRAZOLE SODIUM 40 MG PO TBEC
40.0000 mg | DELAYED_RELEASE_TABLET | Freq: Every day | ORAL | Status: DC
  Administered 2014-07-12 – 2014-07-14 (×3): 40 mg via ORAL
  Filled 2014-07-11 (×3): qty 1

## 2014-07-11 MED ORDER — EZETIMIBE-SIMVASTATIN 10-20 MG PO TABS
1.0000 | ORAL_TABLET | Freq: Every evening | ORAL | Status: DC
  Administered 2014-07-11 – 2014-07-13 (×3): 1 via ORAL
  Filled 2014-07-11 (×4): qty 1

## 2014-07-11 MED ORDER — ADULT MULTIVITAMIN W/MINERALS CH
1.0000 | ORAL_TABLET | Freq: Every day | ORAL | Status: DC
  Administered 2014-07-12 – 2014-07-14 (×3): 1 via ORAL
  Filled 2014-07-11 (×4): qty 1

## 2014-07-11 MED ORDER — CLOPIDOGREL BISULFATE 75 MG PO TABS
75.0000 mg | ORAL_TABLET | Freq: Once | ORAL | Status: AC
  Administered 2014-07-11: 75 mg via ORAL
  Filled 2014-07-11: qty 1

## 2014-07-11 MED ORDER — EZETIMIBE-SIMVASTATIN 10-20 MG PO TABS: 1.0000 | ORAL_TABLET | Freq: Every evening | ORAL | Status: DC

## 2014-07-11 MED ORDER — DEXTROSE 5 % IV SOLN: 1.0000 g | Freq: Once | INTRAVENOUS | Status: DC

## 2014-07-11 MED ORDER — SODIUM CHLORIDE 0.9 % IV BOLUS (SEPSIS)
500.0000 mL | Freq: Once | INTRAVENOUS | Status: AC
  Administered 2014-07-11: 500 mL via INTRAVENOUS

## 2014-07-11 MED ORDER — CLOPIDOGREL BISULFATE 75 MG PO TABS
75.0000 mg | ORAL_TABLET | Freq: Every day | ORAL | Status: DC
  Administered 2014-07-13 – 2014-07-14 (×2): 75 mg via ORAL
  Filled 2014-07-11 (×3): qty 1

## 2014-07-11 MED ORDER — IRBESARTAN 75 MG PO TABS
75.0000 mg | ORAL_TABLET | Freq: Every day | ORAL | Status: DC
  Administered 2014-07-12 – 2014-07-14 (×3): 75 mg via ORAL
  Filled 2014-07-11 (×4): qty 1

## 2014-07-11 MED ORDER — ACETAMINOPHEN 325 MG PO TABS: 650.0000 mg | ORAL_TABLET | Freq: Four times a day (QID) | ORAL | Status: DC | PRN

## 2014-07-11 MED ORDER — ATENOLOL 25 MG PO TABS
25.0000 mg | ORAL_TABLET | Freq: Every day | ORAL | Status: DC
  Administered 2014-07-11 – 2014-07-14 (×4): 25 mg via ORAL
  Filled 2014-07-11 (×4): qty 1

## 2014-07-11 MED ORDER — SERTRALINE HCL 50 MG PO TABS
50.0000 mg | ORAL_TABLET | Freq: Every day | ORAL | Status: DC
  Administered 2014-07-11 – 2014-07-14 (×4): 50 mg via ORAL
  Filled 2014-07-11 (×4): qty 1

## 2014-07-11 MED ORDER — CEFTRIAXONE SODIUM 1 G IJ SOLR
1.0000 g | Freq: Once | INTRAMUSCULAR | Status: AC
  Administered 2014-07-11: 1 g via INTRAVENOUS
  Filled 2014-07-11: qty 10

## 2014-07-11 MED ORDER — VALSARTAN-HYDROCHLOROTHIAZIDE 160-25 MG PO TABS: 0.5000 | ORAL_TABLET | Freq: Every day | ORAL | Status: DC

## 2014-07-11 MED ORDER — ATORVASTATIN CALCIUM 10 MG PO TABS: 10.0000 mg | ORAL_TABLET | Freq: Every day | ORAL | Status: DC

## 2014-07-11 MED ORDER — BETAXOLOL HCL 0.25 % OP SUSP
1.0000 [drp] | Freq: Two times a day (BID) | OPHTHALMIC | Status: DC
  Administered 2014-07-11 – 2014-07-14 (×6): 1 [drp] via OPHTHALMIC
  Filled 2014-07-11: qty 10

## 2014-07-11 MED ORDER — ONDANSETRON HCL 4 MG PO TABS: 4.0000 mg | ORAL_TABLET | Freq: Four times a day (QID) | ORAL | Status: DC | PRN

## 2014-07-11 MED ORDER — DEXTROSE 5 % IV SOLN
1.0000 g | INTRAVENOUS | Status: DC
  Administered 2014-07-12 – 2014-07-14 (×3): 1 g via INTRAVENOUS
  Filled 2014-07-11 (×3): qty 10

## 2014-07-11 MED ORDER — ONDANSETRON HCL 4 MG/2ML IJ SOLN: 4.0000 mg | Freq: Four times a day (QID) | INTRAMUSCULAR | Status: DC | PRN

## 2014-07-11 MED ORDER — SODIUM CHLORIDE 0.9 % IV SOLN
INTRAVENOUS | Status: DC
  Administered 2014-07-11: 75 mL/h via INTRAVENOUS
  Administered 2014-07-12: 10:00:00 via INTRAVENOUS

## 2014-07-11 MED ORDER — FENOFIBRATE 160 MG PO TABS
160.0000 mg | ORAL_TABLET | Freq: Every day | ORAL | Status: DC
  Administered 2014-07-11 – 2014-07-14 (×4): 160 mg via ORAL
  Filled 2014-07-11 (×4): qty 1

## 2014-07-11 NOTE — ED Provider Notes (Signed)
CSN: [id number]     Arrival date & time 07/11/14  1330 History   First MD Initiated Contact with Patient 07/11/14 1504     Chief Complaint  Patient presents with  . Weakness     (Consider location/radiation/quality/duration/timing/severity/associated sxs/prior Treatment) The history is provided by the patient and a relative.  Kirsten Flores is a 81 y.o. female hx of HL, hypertension here presenting with weakness, altered mental status. Patient lives at home with her son. As per the son, she usually can walk by herself but did fall about a week ago and has been progressively weaker. Today she woke up and had repetitive questioning. Per the son, patient she fell asking about what time it is. She is also responding less than usual. A neighbor came and checked on her and noticed possible left-sided weakness and also confirm the repetitive questioning. Patient states that she feels okay. She is on 2 L nasal cannula at baseline.   Level V caveat- AMS    Past Medical History  Diagnosis Date  . Hyperlipidemia   . Hypertension   . Osteopenia   . Thyroid disease   . B12 deficiency   . Arthritis   . Memory loss   . Knee pain     RIGHT  . Gait disturbance    Past Surgical History  Procedure Laterality Date  . Appendectomy    . Cholecystectomy    . Abdominal hysterectomy    . Oophorectomy     Family History  Problem Relation Age of Onset  . Colon cancer    . Melanoma    . Cancer Other     COLON...1ST DEGREE RELATIVE   History  Substance Use Topics  . Smoking status: Former Smoker -- 2.00 packs/day for 25 years    Types: Cigarettes    Quit date: 03/03/1971  . Smokeless tobacco: Never Used  . Alcohol Use: Yes     Comment: 1 cocktail in the evening and a glass of wine - not everyday but doesn't state how often   OB History    No data available     Review of Systems  Neurological: Positive for weakness.  All other systems reviewed and are negative.     Allergies   Diltiazem hcl and Neomycin  Home Medications   Prior to Admission medications   Medication Sig Start Date End Date Taking? Authorizing Provider  acetaminophen (TYLENOL ARTHRITIS PAIN) 650 MG CR tablet Take 1,300 mg by mouth 2 (two) times daily.   Yes Historical Provider, MD  atenolol (TENORMIN) 25 MG tablet TAKE 1 BY MOUTH DAILY 07/02/14  Yes Yvonne R Lowne, DO  atorvastatin (LIPITOR) 10 MG tablet Take 1 tablet (10 mg total) by mouth daily. 05/28/14  Yes Yvonne R Lowne, DO  betaxolol (BETOPTIC-S) 0.25 % ophthalmic suspension Place 1 drop into both eyes 2 (two) times daily.     Yes Historical Provider, MD  clopidogrel (PLAVIX) 75 MG tablet Take 1 tablet (75 mg total) by mouth daily with breakfast. 08/07/13  Yes Alferd Apa Lowne, DO  ezetimibe-simvastatin (VYTORIN) 10-20 MG per tablet Take 1 tablet by mouth every evening.   Yes Historical Provider, MD  fenofibrate (TRICOR) 145 MG tablet TAKE 1 BY MOUTH DAILY 06/25/14  Yes Yvonne R Lowne, DO  gabapentin (NEURONTIN) 600 MG tablet Take 1 tablet (600 mg total) by mouth at bedtime. 10/24/13  Yes Rosalita Chessman, DO  HYDROcodone-acetaminophen (NORCO/VICODIN) 5-325 MG per tablet Take 1 tablet by mouth every 6 (six)  hours as needed for moderate pain. 12/20/13  Yes Merryl Hacker, MD  levothyroxine (SYNTHROID, LEVOTHROID) 125 MCG tablet Take 1 tablet (125 mcg total) by mouth daily. 06/25/14  Yes Rosalita Chessman, DO  Multiple Vitamin (MULTIVITAMIN WITH MINERALS) TABS tablet Take 1 tablet by mouth daily.   Yes Historical Provider, MD  pantoprazole (PROTONIX) 40 MG tablet Take 1 tablet (40 mg total) by mouth daily. 10/24/13  Yes Rosalita Chessman, DO  potassium chloride SA (K-DUR,KLOR-CON) 20 MEQ tablet TAKE 1 BY MOUTH DAILY 03/26/14  Yes Alferd Apa Lowne, DO  sertraline (ZOLOFT) 50 MG tablet Take 1 tablet (50 mg total) by mouth daily. 10/24/13  Yes Rosalita Chessman, DO  tolterodine (DETROL LA) 4 MG 24 hr capsule Take 1 capsule (4 mg total) by mouth daily. 08/07/13  Yes Yvonne R  Lowne, DO  valsartan-hydrochlorothiazide (DIOVAN-HCT) 160-25 MG per tablet Take 0.5 tablets by mouth daily. 10/30/13  Yes Yvonne R Lowne, DO   BP 140/41 mmHg  Pulse 63  Temp(Src) 99 F (37.2 C) (Oral)  Resp 18  SpO2 99% Physical Exam  Constitutional:  Chronically ill   HENT:  Head: Normocephalic.  MM slightly dry   Eyes: Conjunctivae are normal. Pupils are equal, round, and reactive to light.  Neck: Normal range of motion. Neck supple.  Cardiovascular: Normal rate and regular rhythm.   2/6 systolic murmur loudest at LUSB  Pulmonary/Chest: Effort normal and breath sounds normal. No respiratory distress. She has no wheezes. She has no rales.  Abdominal: Soft. Bowel sounds are normal. She exhibits no distension. There is no tenderness. There is no rebound.  Musculoskeletal: Normal range of motion.  1+ edema bilaterally   Neurological:  A&O x 2 (baseline). Nl strength throughout. R knee bruise but nl ROM. Nl sensation throughout   Skin: Skin is warm and dry.  Psychiatric:  Unable   Nursing note and vitals reviewed.   ED Course  Procedures (including critical care time) Labs Review Labs Reviewed  CBC WITH DIFFERENTIAL/PLATELET - Abnormal; Notable for the following:    RBC 3.75 (*)    MCV 112.8 (*)    MCHC 28.8 (*)    All other components within normal limits  COMPREHENSIVE METABOLIC PANEL - Abnormal; Notable for the following:    Sodium 147 (*)    Potassium 5.2 (*)    CO2 35 (*)    Glucose, Bld 113 (*)    BUN 30 (*)    Total Protein 6.3 (*)    GFR calc non Af Amer 51 (*)    GFR calc Af Amer 59 (*)    All other components within normal limits  URINALYSIS, ROUTINE W REFLEX MICROSCOPIC - Abnormal; Notable for the following:    Color, Urine AMBER (*)    APPearance CLOUDY (*)    Bilirubin Urine SMALL (*)    Nitrite POSITIVE (*)    All other components within normal limits  BRAIN NATRIURETIC PEPTIDE - Abnormal; Notable for the following:    B Natriuretic Peptide 282.5 (*)     All other components within normal limits  URINE MICROSCOPIC-ADD ON - Abnormal; Notable for the following:    Bacteria, UA MANY (*)    Casts HYALINE CASTS (*)    All other components within normal limits  URINE CULTURE  I-STAT TROPOININ, ED  I-STAT CG4 LACTIC ACID, ED    Imaging Review Dg Chest 2 View  07/11/2014   CLINICAL DATA:  Short of breath  EXAM: CHEST  2  VIEW  COMPARISON:  12/20/2013  FINDINGS: Chronic lung disease with increased lung markings due to fibrosis, unchanged from the prior study. Negative for infiltrate or effusion or mass lesion.  Severe compression fracture of approximately T12 is unchanged from the prior study.  IMPRESSION: Chronic lung disease with fibrosis. No superimposed acute abnormality.   Electronically Signed   By: Franchot Gallo M.D.   On: 07/11/2014 15:58   Ct Head Wo Contrast  07/11/2014   CLINICAL DATA:  81 year-old female with new onset weakness, altered mental status. Initial encounter.  EXAM: CT HEAD WITHOUT CONTRAST  TECHNIQUE: Contiguous axial images were obtained from the base of the skull through the vertex without intravenous contrast.  COMPARISON:  Brain MRI 07/21/2013 and earlier.  FINDINGS: Chronic small right maxillary sinus mucous retention cyst. Other Visualized paranasal sinuses and mastoids are clear. No acute osseous abnormality identified.  No acute orbit or scalp soft tissue findings.  Calcified atherosclerosis at the skull base.  Stable cerebral volume. Stable ventricle size and configuration. No midline shift, mass effect, or evidence of intracranial mass lesion. Gray-white matter differentiation is stable and normal for age. No suspicious intracranial vascular hyperdensity. No acute intracranial hemorrhage identified. No evidence of cortically based acute infarction identified.  IMPRESSION: No acute intracranial abnormality. Stable and largely negative for age noncontrast CT appearance of the brain.   Electronically Signed   By: Genevie Ann M.D.   On: 07/11/2014 16:13     EKG Interpretation None      MDM   Final diagnoses:  None    Kirsten Flores is a 81 y.o. female here with weakness, has heart murmur. On review of records, previous echo showed mild aortic stenosis. Consider worsening aortic stenosis versus stroke versus infection versus electrolyte imbalance. Also requiring more oxygen so consider worsening heart failure as well. Will get labs, BNP, UA, CXR, CT head.   5:13 PM CT head unremarkable. UA + UTI. Given ceftriaxone. Hospitalist to admit for weakness from UTI, also possible worsening aortic stenosis.    Wandra Arthurs, MD 07/11/14 [phone]

## 2014-07-11 NOTE — Progress Notes (Signed)
The patient and son confirm she has been taking atorvastatin in addition to simvastatin/ezetimibe and fenofibrate. This duplication and combination with a fibrate increases the risk of myalgias and rhabdomyolysis. I communicated this to Dr. Charlies Silvers and she gave me a verbal order to dc atorvastatin.   Romeo Rabon, PharmD, pager [phone]. 07/11/2014,7:12 PM.

## 2014-07-11 NOTE — ED Notes (Signed)
Per EMS: Pt's neighbors come and check on her every couple of days.  Pt lives with her son but he was at work. Neighbors came over to visit and noticed that she is weaker than normal, slow to respond, and noticed jerky motions.  Pt is A&O x 4 but is slow to respond.  Pt states she feels perfectly fine and denies feeling weak.  Equal grip strengths.  No slurred speech or facial droop.

## 2014-07-11 NOTE — ED Notes (Signed)
I have just given report to Simona Huh, RN on [place] and will transport shortly.

## 2014-07-11 NOTE — H&P (Addendum)
[facility] History and Physical  KRISLYN DONNAN LKG:[id number] DOB: 1923-05-28 DOA: 07/11/2014  Referring physician: ER physician: Dr. Shirlyn Goltz PCP: Garnet Koyanagi, DO  Chief Complaint: weakness  HPI:  81 year old female with past medical history of hypertension, dyslipidemia, memory loss, COPD on home oxygen (2L [place]) hypothyroidism who presented to [facility] ED due to ongoing weakness and mental status changes as reported by patient's son a the bedside over past day PTA. There was mention of possible left side weakness but patient's son cannot confirm that. Patient is not as good of historian due to noted altered mental status but she is able to say how she feels and if she has some concerns. She does not report pain, no report of chest pain, shortness of breath or palpitations. No abdominal pain, nausea or vomiting. No fevers. No cough. No reports of blood in stool or urine.   In ED< pt is hemodynamically stable. Blood work is notable for sodium of 147, potassium 5.2. CT head showed no acute intracranial findings. CXR showed no acute cardiopulmonary process. UA revealed many bacteria but no leukocytes. She was started on empiric rocephin and admitted for further evaluation of AMS and weakness.    Assessment & Plan    Principal Problem: Generalized weakness - Likely due to UTI - Provide supportive care - Obtain PT evaluation   Active Problems: Acute encephalopathy - Secondary to UTI and / or generalized weakness. She does have history of memory loss so possibility includes underlying dementia - CT head did not show acute intracranial findings - No acute cardiopulmonary process identified on CXR   UTI - UA revealed many bacteria but interestingly no leukocytes  - Started Rocephin IV daily empirically  - Follow up urine culture results  Dyslipidemia - Continue Vytorin. Apparently she is also on statin alone, Lipitor but since this is duplication will hold Lipitor - Continue  fenofibrate  Essential hypertension - Continue atenolol and diovan  Hypothyroidism - Continue synthroid - Check TSH  Hyperkalemia - Pt on potassium supplementation which likely contributed to potassium of 5.2 - Given kayexalate - No acute changes on 12 lead EKG - Follow up BMP in am  Hypernatremia - Likely from dehydration - Continue IV fluids which were started on admission - Follow up BMP in am   Depression - Continue Zoloft   Morbid obesity, obesity related comorbid conditions  - Body mass index is 36.68 kg/(m^2) - Related obesity health conditions: hypertension, dyslipidemia  - Nutrition consulted     DVT prophylaxis:  - SCD's bilaterally   Radiological Exams on Admission: Dg Chest 2 View 07/11/2014  Chronic lung disease with fibrosis. No superimposed acute abnormality.   Electronically Signed   By: Franchot Gallo M.D.   On: 07/11/2014 15:58   Ct Head Wo Contrast 07/11/2014  No acute intracranial abnormality. Stable and largely negative for age noncontrast CT appearance of the brain.   Electronically Signed   By: Genevie Ann M.D.   On: 07/11/2014 16:13    EKG: I have personally reviewed EKG. EKG shows sinus rhythm  Code Status: Full Family Communication: Plan of care discussed with the patient and her son at the bedside  Disposition Plan: Admit for further evaluation  Leisa Lenz, MD  [facility] Pager [phone]  Time spent in minutes: 75 minutes  Review of Systems:  Constitutional: Negative for fever, chills and malaise/fatigue. Negative for diaphoresis.  HENT: Negative for hearing loss, ear pain, nosebleeds, congestion, sore throat, neck pain, tinnitus  and ear discharge.   Eyes: Negative for blurred vision, double vision, photophobia, pain, discharge and redness.  Respiratory: Negative for cough, hemoptysis, sputum production, shortness of breath, wheezing and stridor.   Cardiovascular: Negative for chest pain, palpitations, orthopnea, claudication and  leg swelling.  Gastrointestinal: Negative for nausea, vomiting and abdominal pain. Negative for heartburn, constipation, blood in stool and melena.  Genitourinary: Negative for dysuria, urgency, frequency, hematuria and flank pain.  Musculoskeletal: positive for falls, joint pains.  Skin: Negative for itching and rash.  Neurological: Negative for dizziness and positive for weakness. Negative for tingling, tremors, sensory change, speech change, focal weakness, loss of consciousness and headaches.  Endo/Heme/Allergies: Negative for environmental allergies and polydipsia. Does not bruise/bleed easily.  Psychiatric/Behavioral: Negative for suicidal ideas. The patient is not nervous/anxious.      Past Medical History  Diagnosis Date  . Hyperlipidemia   . Hypertension   . Osteopenia   . Thyroid disease   . B12 deficiency   . Arthritis   . Memory loss   . Knee pain     RIGHT  . Gait disturbance    Past Surgical History  Procedure Laterality Date  . Appendectomy    . Cholecystectomy    . Abdominal hysterectomy    . Oophorectomy     Social History:  reports that she quit smoking about 43 years ago. Her smoking use included Cigarettes. She has a 50 pack-year smoking history. She has never used smokeless tobacco. She reports that she drinks alcohol. She reports that she does not use illicit drugs.  Allergies  Allergen Reactions  . Diltiazem Hcl Other (See Comments)    Unknown reaction  . Neomycin Other (See Comments)    Unknown reaction (a long time ago)    Family History:  Family History  Problem Relation Age of Onset  . Hypertension Mother and father    . Melanoma    . Cancer Other     COLON...1ST DEGREE RELATIVE     Prior to Admission medications   Medication Sig Start Date End Date Taking? Authorizing Provider  acetaminophen (TYLENOL ARTHRITIS PAIN) 650 MG CR tablet Take 1,300 mg by mouth 2 (two) times daily.   Yes Historical Provider, MD  atenolol (TENORMIN) 25 MG  tablet TAKE 1 BY MOUTH DAILY 07/02/14  Yes Yvonne R Lowne, DO  atorvastatin (LIPITOR) 10 MG tablet Take 1 tablet (10 mg total) by mouth daily. 05/28/14  Yes Yvonne R Lowne, DO  betaxolol (BETOPTIC-S) 0.25 % ophthalmic suspension Place 1 drop into both eyes 2 (two) times daily.     Yes Historical Provider, MD  clopidogrel (PLAVIX) 75 MG tablet Take 1 tablet (75 mg total) by mouth daily with breakfast. 08/07/13  Yes Alferd Apa Lowne, DO  ezetimibe-simvastatin (VYTORIN) 10-20 MG per tablet Take 1 tablet by mouth every evening.   Yes Historical Provider, MD  fenofibrate (TRICOR) 145 MG tablet TAKE 1 BY MOUTH DAILY 06/25/14  Yes Yvonne R Lowne, DO  gabapentin (NEURONTIN) 600 MG tablet Take 1 tablet (600 mg total) by mouth at bedtime. 10/24/13  Yes Rosalita Chessman, DO  HYDROcodone-acetaminophen (NORCO/VICODIN) 5-325 MG per tablet Take 1 tablet by mouth every 6 (six) hours as needed for moderate pain. 12/20/13  Yes Merryl Hacker, MD  levothyroxine (SYNTHROID, LEVOTHROID) 125 MCG tablet Take 1 tablet (125 mcg total) by mouth daily. 06/25/14  Yes Rosalita Chessman, DO  Multiple Vitamin (MULTIVITAMIN WITH MINERALS) TABS tablet Take 1 tablet by mouth daily.   Yes  Historical Provider, MD  pantoprazole (PROTONIX) 40 MG tablet Take 1 tablet (40 mg total) by mouth daily. 10/24/13  Yes Rosalita Chessman, DO  potassium chloride SA (K-DUR,KLOR-CON) 20 MEQ tablet TAKE 1 BY MOUTH DAILY 03/26/14  Yes Alferd Apa Lowne, DO  sertraline (ZOLOFT) 50 MG tablet Take 1 tablet (50 mg total) by mouth daily. 10/24/13  Yes Rosalita Chessman, DO  tolterodine (DETROL LA) 4 MG 24 hr capsule Take 1 capsule (4 mg total) by mouth daily. 08/07/13  Yes Yvonne R Lowne, DO  valsartan-hydrochlorothiazide (DIOVAN-HCT) 160-25 MG per tablet Take 0.5 tablets by mouth daily. 10/30/13  Yes Rosalita Chessman, DO   Physical Exam: Filed Vitals:   07/11/14 1331 07/11/14 1333 07/11/14 1556  BP: 136/42  140/41  Pulse: 65  63  Temp: 99 F (37.2 C)    TempSrc: Oral    Resp:  20  18  SpO2: 82% 90% 99%    Physical Exam  Constitutional: Appears well-developed and well-nourished. No distress.  HENT: Normocephalic. No tonsillar erythema or exudates Eyes: Conjunctivae and EOM are normal. No scleral icterus.  Neck: Normal ROM. Neck supple. No JVD. No tracheal deviation. No thyromegaly.  CVS: RRR, S1/S2 +, SEM appreciated, no gallops, no carotid bruit.  Pulmonary: Effort and breath sounds normal, no stridor, rhonchi, wheezes, rales.  Abdominal: Soft. BS +,  no distension, tenderness, rebound or guarding.  Musculoskeletal: Normal range of motion. No edema and no tenderness.  Lymphadenopathy: No lymphadenopathy noted, cervical, inguinal. Neuro: Alert. Normal reflexes, muscle tone coordination. No focal neurologic deficits. Skin: Skin is warm and dry.  Psychiatric: Normal mood and affect. Behavior, judgment, thought content normal.   Labs on Admission:  Basic Metabolic Panel:  Recent Labs Lab 07/11/14 1525  NA 147*  K 5.2*  CL 106  CO2 35*  GLUCOSE 113*  BUN 30*  CREATININE 0.96  CALCIUM 9.7   Liver Function Tests:  Recent Labs Lab 07/11/14 1525  AST 27  ALT 17  ALKPHOS 42  BILITOT 1.2  PROT 6.3*  ALBUMIN 3.5   No results for input(s): LIPASE, AMYLASE in the last 168 hours. No results for input(s): AMMONIA in the last 168 hours. CBC:  Recent Labs Lab 07/11/14 1525  WBC 5.0  NEUTROABS 3.4  HGB 12.2  HCT 42.3  MCV 112.8*  PLT 159   Cardiac Enzymes: No results for input(s): CKTOTAL, CKMB, CKMBINDEX, TROPONINI in the last 168 hours. BNP: Invalid input(s): POCBNP CBG: No results for input(s): GLUCAP in the last 168 hours.  If 7PM-7AM, please contact night-coverage [email or web address] Password [facility] 07/11/2014, 5:31 PM

## 2014-07-11 NOTE — ED Notes (Signed)
Bed: RESB Expected date:  Expected time:  Means of arrival:  Comments: EMS 

## 2014-07-12 ENCOUNTER — Encounter (HOSPITAL_COMMUNITY): Payer: Self-pay | Admitting: *Deleted

## 2014-07-12 DIAGNOSIS — E875 Hyperkalemia: Secondary | ICD-10-CM

## 2014-07-12 DIAGNOSIS — I10 Essential (primary) hypertension: Secondary | ICD-10-CM

## 2014-07-12 DIAGNOSIS — E785 Hyperlipidemia, unspecified: Secondary | ICD-10-CM

## 2014-07-12 LAB — COMPREHENSIVE METABOLIC PANEL
ALBUMIN: 3.2 g/dL — AB (ref 3.5–5.0)
ALK PHOS: 38 U/L (ref 38–126)
ALT: 18 U/L (ref 14–54)
ANION GAP: 7 (ref 5–15)
AST: 25 U/L (ref 15–41)
BUN: 21 mg/dL — ABNORMAL HIGH (ref 6–20)
CHLORIDE: 103 mmol/L (ref 101–111)
CO2: 37 mmol/L — ABNORMAL HIGH (ref 22–32)
Calcium: 9.2 mg/dL (ref 8.9–10.3)
Creatinine, Ser: 0.78 mg/dL (ref 0.44–1.00)
GFR calc Af Amer: >60 mL/min (ref >60)
GFR calc non Af Amer: >60 mL/min (ref >60)
GLUCOSE: 108 mg/dL — AB (ref 65–99)
Potassium: 4.3 mmol/L (ref 3.5–5.1)
SODIUM: 147 mmol/L — AB (ref 135–145)
TOTAL PROTEIN: 5.7 g/dL — AB (ref 6.5–8.1)
Total Bilirubin: 1 mg/dL (ref 0.3–1.2)

## 2014-07-12 LAB — CBC
HCT: 38.4 % (ref 36.0–46.0)
HEMOGLOBIN: 11 g/dL — AB (ref 12.0–15.0)
MCH: 32.3 pg (ref 26.0–34.0)
MCHC: 28.6 g/dL — ABNORMAL LOW (ref 30.0–36.0)
MCV: 112.6 fL — ABNORMAL HIGH (ref 78.0–100.0)
Platelets: 138 10*3/uL — ABNORMAL LOW (ref 150–400)
RBC: 3.41 MIL/uL — AB (ref 3.87–5.11)
RDW: 14.1 % (ref 11.5–15.5)
WBC: 4.6 10*3/uL (ref 4.0–10.5)

## 2014-07-12 LAB — GLUCOSE, CAPILLARY: GLUCOSE-CAPILLARY: 103 mg/dL — AB (ref 65–99)

## 2014-07-12 MED ORDER — LEVOTHYROXINE SODIUM 150 MCG PO TABS
150.0000 ug | ORAL_TABLET | Freq: Every day | ORAL | Status: DC
  Administered 2014-07-13 – 2014-07-14 (×2): 150 ug via ORAL
  Filled 2014-07-12 (×3): qty 1

## 2014-07-12 MED ORDER — DEXTROSE 5 % IV SOLN
INTRAVENOUS | Status: DC
  Administered 2014-07-12 – 2014-07-14 (×4): via INTRAVENOUS

## 2014-07-12 MED ORDER — ENSURE ENLIVE PO LIQD
237.0000 mL | Freq: Two times a day (BID) | ORAL | Status: DC
  Administered 2014-07-12 – 2014-07-14 (×5): 237 mL via ORAL

## 2014-07-12 NOTE — Progress Notes (Signed)
Initial Nutrition Assessment  DOCUMENTATION CODES:  Obesity unspecified  INTERVENTION:  Ensure Enlive (each supplement provides 350kcal and 20 grams of protein) BID Encourage PO  NUTRITION DIAGNOSIS:  Increased nutrient needs related to acute illness as evidenced by estimated needs, meal completion < 25%.  GOAL:  Patient will meet greater than or equal to 90% of their needs  MONITOR:  PO intake, I & O's, Labs, Weight trends  REASON FOR ASSESSMENT:  Consult Diet education  ASSESSMENT: Pt is a 81 year old female with past medical history of hypertension, dyslipidemia, memory loss, COPD, hypothyroidism who presented to [facility] ED due to ongoing weakness and mental status changes. Patient is not as good of historian due to noted altered mental status but she is able to say how she feels and if she has some concerns.  Pt discussed during rounds and with RN. Per RN pt is very confused and diet education would be inappropriate. Pt received breakfast at time of visit (just yogurt and a banana). Assisted pt with eating the yogurt. Will order Ensure Enlive BID to hopefully increase PO intake.  When asked how she is doing, pt answered" I don't know" and kept saying she was cold. Per nursing notes, pt refused dinner last night and ate only dessert. Attempted NFPE but pt became combative; no obvious signs of fat or muscle depletion noted. Will continue to monitor.   Labs reviewed: Na 147, BUN 21, Glu 103, Alb 3.2  Height:  Ht Readings from Last 1 Encounters:  07/11/14 5\' 2"  (1.575 m)    Weight:  Wt Readings from Last 1 Encounters:  07/11/14 204 lb 5.9 oz (92.7 kg)    Ideal Body Weight:  50 kg  Wt Readings from Last 10 Encounters:  07/11/14 204 lb 5.9 oz (92.7 kg)  05/01/14 211 lb 12.8 oz (96.072 kg)  01/30/14 217 lb (98.431 kg)  01/04/14 255 lb (115.667 kg)  10/30/13 255 lb (115.667 kg)  09/14/13 220 lb (99.791 kg)  08/15/13 214 lb (97.07 kg)  07/20/13 215 lb 4.8 oz (97.659 kg)   01/27/13 229 lb (103.874 kg)  07/27/12 222 lb 12.8 oz (101.061 kg)    BMI:  Body mass index is 37.37 kg/(m^2).  Estimated Nutritional Needs:  Kcal:  1200 - 1400  Protein:  60 - 70 g  Fluid:  per MD  Skin:  Reviewed, no issues  Diet Order:  Diet Heart Room service appropriate?: No; Fluid consistency:: ThinHeart Healthy  EDUCATION NEEDS:  Education needs no appropriate at this time   Intake/Output Summary (Last 24 hours) at 07/12/14 1443 Last data filed at 07/12/14 0900  Gross per 24 hour  Intake    830 ml  Output      1 ml  Net    829 ml    Last BM:  5/10  Daniele Dillow A. [place] Dietetic Intern Pager: [phone] 07/12/2014 2:43 PM

## 2014-07-12 NOTE — Progress Notes (Signed)
Patient ID: Kirsten Flores, female   DOB: 1923-04-07, 81 y.o.   MRN: [id number] [facility] PROGRESS NOTE  Kirsten Flores DOB: 1923-10-22 DOA: 07/11/2014 PCP: Garnet Koyanagi, DO  Brief narrative:    81 year old female with past medical history of hypertension, dyslipidemia, memory loss, COPD on home oxygen (2L [place]) hypothyroidism who presented to [facility] ED due to ongoing weakness and mental status changes. On admission, she was found to have UTI and was started on rocephin.  Barrier to discharge: receiving IV rocephin for UTI. Will follow up urine culture results. Anticipate D/C date 07/14/2014.   Assessment/Plan:    Principal Problem: Generalized weakness - Likely secondary to UTI - PT eval pending - i spoke with her son who requested to look into [facility] SNF (if PT recommends SNF)  Active Problems: Acute encephalopathy - Secondary to UTI and generalized weakness.  - CT head on admission did not show acute intracranial findings - CXR with no evidence of pneumonia   UTI - UA revealed many bacteria but no leukocytes  - Continue rocpehin daily - Follow up Ucx results   Dyslipidemia - Continue Vytorin.  - Continue fenofibrate  Essential hypertension - Continue atenolol and diovan  Hypothyroidism - Continue synthroid. Her TSH is 12.5 on this admission so will increase the dose slightly.   Hyperkalemia - Pt on potassium supplementation which likely contributed to potassium of 5.2 - Has received kayexalate on admission - Potassium now WNL  Hypernatremia - Likely from dehydration - Change IV fluids from NS to D5W - Follow up BMP tomorrow am   Depression - Continue Zoloft   Morbid obesity, obesity related comorbid conditions  - Body mass index is 36.68 kg/(m^2) - Continue nutritional supplementation      DVT prophylaxis:  - SCD's bilaterally in hospital   Code Status: Full.  Family Communication:  plan of care discussed with the  patient's son at the bedside  Disposition Plan: to [facility] 07/14/2014 if she feels better and if PT recommends it   IV access:  Peripheral IV  Procedures and diagnostic studies:    Dg Chest 2 View 07/11/2014 Chronic lung disease with fibrosis. No superimposed acute abnormality.   Electronically Signed   By: Franchot Gallo M.D.   On: 07/11/2014 15:58   Ct Head Wo Contrast 07/11/2014   No acute intracranial abnormality. Stable and largely negative for age noncontrast CT appearance of the brain.   Electronically Signed   By: Genevie Ann M.D.   On: 07/11/2014 16:13    Medical Consultants:  None   Other Consultants:  Physical therapy  IAnti-Infectives:   Rocephin 07/11/2014 -->    Leisa Lenz, MD  [facility] Pager [phone]  Time spent in minutes: 25 minutes, coordinating care, updating the family, reviewing the chart and note writing.   If 7PM-7AM, please contact night-coverage [email or web address] Password [email or web address] 07/12/2014, 3:40 PM   LOS: 1 day    HPI/Subjective: No acute overnight events. Patient reports feeling better. She reports no nausea or vomiting.   Objective: Filed Vitals:   07/11/14 2308 07/12/14 0100 07/12/14 0300 07/12/14 1425  BP: 131/53  145/58 116/38  Pulse: 69  68 67  Temp: 98 F (36.7 C)  97.7 F (36.5 C) 98.4 F (36.9 C)  TempSrc: Oral  Oral Oral  Resp: 28 20 20 25   Height:      Weight: 92.7 kg (204 lb 5.9 oz)     SpO2: 94%  94% 97%  Intake/Output Summary (Last 24 hours) at 07/12/14 1540 Last data filed at 07/12/14 0900  Gross per 24 hour  Intake    830 ml  Output      1 ml  Net    829 ml    Exam:   General:  Pt is alert, no distress  Cardiovascular: Regular rate and rhythm, S1/S2 (+)  Respiratory: no wheezing, no crackles, no rhonchi  Abdomen: Soft, non tender, non distended, bowel sounds present  Extremities: Nocyanosis, pulses  palpable bilaterally  Neuro: Grossly nonfocal  Data Reviewed: Basic Metabolic Panel:  Recent Labs Lab  07/11/14 1525 07/12/14 0528  NA 147* 147*  K 5.2* 4.3  CL 106 103  CO2 35* 37*  GLUCOSE 113* 108*  BUN 30* 21*  CREATININE 0.96 0.78  CALCIUM 9.7 9.2   Liver Function Tests:  Recent Labs Lab 07/11/14 1525 07/12/14 0528  AST 27 25  ALT 17 18  ALKPHOS 42 38  BILITOT 1.2 1.0  PROT 6.3* 5.7*  ALBUMIN 3.5 3.2*   No results for input(s): LIPASE, AMYLASE in the last 168 hours. No results for input(s): AMMONIA in the last 168 hours. CBC:  Recent Labs Lab 07/11/14 1525 07/12/14 0528  WBC 5.0 4.6  NEUTROABS 3.4  --   HGB 12.2 11.0*  HCT 42.3 38.4  MCV 112.8* 112.6*  PLT 159 138*   Cardiac Enzymes: No results for input(s): CKTOTAL, CKMB, CKMBINDEX, TROPONINI in the last 168 hours. BNP: Invalid input(s): POCBNP CBG:  Recent Labs Lab 07/12/14 0754  GLUCAP 103*    No results found for this or any previous visit (from the past 240 hour(s)).   Scheduled Meds: . atenolol  25 mg Oral Daily  . betaxolol  1 drop Both Eyes BID  . cefTRIAXone (ROCEPHIN)  IV  1 g Intravenous Q24H  . [START ON 07/13/2014] clopidogrel  75 mg Oral Q breakfast  . ezetimibe-simvastatin  1 tablet Oral QPM  . feeding supplement (ENSURE ENLIVE)  237 mL Oral BID BM  . fenofibrate  160 mg Oral Daily  . fesoterodine  8 mg Oral Daily  . gabapentin  600 mg Oral QHS  . irbesartan  75 mg Oral Daily   And  . hydrochlorothiazide  12.5 mg Oral Daily  . levothyroxine  125 mcg Oral QAC breakfast  . multivitamin with minerals  1 tablet Oral Daily  . pantoprazole  40 mg Oral Daily  . sertraline  50 mg Oral Daily   Continuous Infusions: . dextrose

## 2014-07-12 NOTE — Evaluation (Signed)
Physical Therapy Evaluation Patient Details Name: Kirsten Flores MRN: [id number] DOB: [birth year] Today's Date: 07/12/2014   History of Present Illness  81 year old female with past medical history of hypertension, dyslipidemia, memory loss, COPD on home oxygen (2L [place]) hypothyroidism admitted for generalized weakness and acute encephalopathy due to UTI.  Clinical Impression  Pt admitted with above diagnosis. Pt currently with functional limitations due to the deficits listed below (see PT Problem List).  Pt will benefit from skilled PT to increase their independence and safety with mobility to allow discharge to the venue listed below.  Pt agreeable to mobilize however bed/linen saturated with urine so NT assisted with hygiene and changing linen while PT assisted pt with standing with RW.  Pt appears confused at times and mobility likely to improve with improvement in cognition.  Son into room upon returning pt to supine and states he can assist pt with mobility at home.     Follow Up Recommendations SNF (however pt and son likely to decline so HHPT)    Equipment Recommendations  None recommended by PT    Recommendations for Other Services       Precautions / Restrictions Precautions Precautions: Fall Precaution Comments: chronic 2L O2      Mobility  Bed Mobility Overal bed mobility: Needs Assistance;+2 for physical assistance Bed Mobility: Supine to Sit;Sit to Supine     Supine to sit: Min assist Sit to supine: Mod assist   General bed mobility comments: assist for trunk upright and then LEs onto bed  Transfers Overall transfer level: Needs assistance Equipment used: Rolling walker (2 wheeled) Transfers: Sit to/from Stand Sit to Stand: Min assist;From elevated surface         General transfer comment: pt unable to stand initially however min assist with elevated bed, increased forward flexion with stand  Ambulation/Gait Ambulation/Gait assistance:  (deferred for  safety)              Stairs            Wheelchair Mobility    Modified Rankin (Stroke Patients Only)       Balance Overall balance assessment: Needs assistance         Standing balance support: Bilateral upper extremity supported Standing balance-Leahy Scale: Poor Standing balance comment: pt requires UE assist on RW and slight min assist to steady with standing EOB while NT perfermed hygiene and changed soiled linen                             Pertinent Vitals/Pain Pain Assessment: No/denies pain    Home Living Family/patient expects to be discharged to:: Private residence Living Arrangements: Children (son lives with her) Available Help at Discharge: Family Type of Home: House         Home Equipment: Environmental consultant - 2 wheels      Prior Function Level of Independence: Needs assistance   Gait / Transfers Assistance Needed: ambulatory with RW, son reports he usually helps her when he is home and she waits until he returns to mobilize           Hand Dominance        Extremity/Trunk Assessment   Upper Extremity Assessment: Generalized weakness           Lower Extremity Assessment: Generalized weakness         Communication   Communication: No difficulties  Cognition Arousal/Alertness: Awake/alert Behavior During Therapy: [facility] for tasks  assessed/performed Overall Cognitive Status: Impaired/Different from baseline                 General Comments: able to follow commands, appears confused at times, son into room after session    General Comments      Exercises        Assessment/Plan    PT Assessment Patient needs continued PT services  PT Diagnosis Difficulty walking;Generalized weakness   PT Problem List Decreased strength;Decreased activity tolerance;Decreased mobility;Decreased balance  PT Treatment Interventions DME instruction;Balance training;Gait training;Functional mobility training;Therapeutic  activities;Patient/family education;Therapeutic exercise   PT Goals (Current goals can be found in the Care Plan section) Acute Rehab PT Goals PT Goal Formulation: With patient Time For Goal Achievement: 07/19/14 Potential to Achieve Goals: Good    Frequency Min 3X/week   Barriers to discharge        Co-evaluation               End of Session   Activity Tolerance: Patient tolerated treatment well Patient left: in bed;with call bell/phone within reach;with family/visitor present Nurse Communication:  (NT assisted during session)         Time: 4827-0786 PT Time Calculation (min) (ACUTE ONLY): 17 min   Charges:   PT Evaluation $Initial PT Evaluation Tier I: 1 Procedure     PT G Codes:        Mallika Sanmiguel,KATHrine E 07/12/2014, 3:40 PM Carmelia Bake, PT, DPT 07/12/2014 Pager: [phone]

## 2014-07-13 DIAGNOSIS — E870 Hyperosmolality and hypernatremia: Secondary | ICD-10-CM

## 2014-07-13 LAB — URINE CULTURE

## 2014-07-13 LAB — GLUCOSE, CAPILLARY: GLUCOSE-CAPILLARY: 101 mg/dL — AB (ref 65–99)

## 2014-07-13 MED ORDER — GUAIFENESIN 100 MG/5ML PO SOLN
200.0000 mg | ORAL | Status: DC | PRN
  Administered 2014-07-13: 200 mg via ORAL
  Filled 2014-07-13: qty 10

## 2014-07-13 NOTE — Evaluation (Signed)
Occupational Therapy Evaluation Patient Details Name: Kirsten Flores MRN: [id number] DOB: 07-15-1923 Today's Date: 07/13/2014    History of Present Illness 81 year old female with past medical history of hypertension, dyslipidemia, memory loss, COPD on home oxygen (2L [place]) hypothyroidism admitted for generalized weakness and acute encephalopathy due to UTI.   Clinical Impression   Pt was admitted for the above.  Per chart, she lives with son.  Unsure about PLOF.  Pt will benefit from skilled OT to increase safety and independence with adls and mobility related to ADLs to decrease burden of care and promote safety with adls.  Goals are for min to mod A.  Pt required max A to stand during evaluation.    Follow Up Recommendations  SNF unless son can manage at current level of assistance   Equipment Recommendations  3 in 1 bedside comode    Recommendations for Other Services       Precautions / Restrictions Precautions Precautions: Fall Restrictions Weight Bearing Restrictions: No      Mobility Bed Mobility         Supine to sit: Mod assist Sit to supine: Mod assist;+2 for physical assistance   General bed mobility comments: HOB raised; +2 to guide trunk when lying down and assist with LEs  Transfers   Equipment used: Rolling walker (2 wheeled) Transfers: Sit to/from Stand Sit to Stand: Max assist         General transfer comment: max a to power up and min A to maintain static standing for 30 seconds.  Pt shaky and trunk flexed forward    Balance           Standing balance support: Bilateral upper extremity supported Standing balance-Leahy Scale: Poor Standing balance comment: used RW and min A from OT for steadying as pt was shaky                            ADL Overall ADL's : Needs assistance/impaired     Grooming: Minimal assistance;Wash/dry hands;Wash/dry face;Sitting (guarded forehead: scabs)   Upper Body Bathing: Moderate  assistance;Sitting   Lower Body Bathing: Total assistance;Bed level   Upper Body Dressing : Moderate assistance;Sitting   Lower Body Dressing: Total assistance;Bed level                 General ADL Comments: performed ADL from EOB.  Pt needed cues to initiate and continue.  Pt incontinent x urine.  Stood with max A from elevated surface:  and was shaky.  NT was changing sheets. Would need +2 for standing portion of adls.  Returned to supine to hygiene.      Vision     Perception     Praxis      Pertinent Vitals/Pain Pain Assessment: No/denies pain     Hand Dominance Right   Extremity/Trunk Assessment Upper Extremity Assessment Upper Extremity Assessment: Generalized weakness           Communication Communication Communication: HOH   Cognition Arousal/Alertness: Awake/alert Behavior During Therapy: WFL for tasks assessed/performed Overall Cognitive Status: No family/caregiver present to determine baseline cognitive functioning                   General Comments: decreased initiation.  Follow one step commands with cues/extra time.  H/O memory loss   General Comments       Exercises       Shoulder Instructions      Home  Living Family/patient expects to be discharged to:: Unsure                                 Additional Comments: lives with son:  no family present, uncertain about PLOF/DME      Prior Functioning/Environment               OT Diagnosis: Generalized weakness   OT Problem List: Decreased strength;Decreased activity tolerance;Impaired balance (sitting and/or standing);Decreased cognition   OT Treatment/Interventions: Self-care/ADL training;DME and/or AE instruction;Balance training;Patient/family education;Cognitive remediation/compensation    OT Goals(Current goals can be found in the care plan section) Acute Rehab OT Goals Patient Stated Goal: unable to state.  Agreeable to OT OT Goal Formulation: Patient  unable to participate in goal setting Time For Goal Achievement: 07/20/14 Potential to Achieve Goals: Fair ADL Goals Pt Will Transfer to Toilet: with mod assist;bedside commode;stand pivot transfer Additional ADL Goal #1: pt will go from sit to stand with min A and maintain static standing for 2 minutes with min guard for adls  OT Frequency: Min 2X/week   Barriers to D/C:            Co-evaluation              End of Session    Activity Tolerance: Patient tolerated treatment well Patient left: in bed;with call bell/phone within reach;with bed alarm set   Time: 9629-5284 OT Time Calculation (min): 30 min Charges:  OT General Charges $OT Visit: 1 Procedure OT Evaluation $Initial OT Evaluation Tier I: 1 Procedure OT Treatments $Self Care/Home Management : 8-22 mins G-Codes:    Hensley Aziz Jul 22, 2014, 1:57 PM   Lesle Chris, OTR/L [phone] 07/22/14

## 2014-07-13 NOTE — Care Management Note (Signed)
Case Management Note  Patient Details  Name: MAYELA BULLARD MRN: [id number] Date of Birth: 1923/10/02  Subjective/Objective:      81 yo female admitted  With UTI from home              Action/Plan: Spoke with patient regarding PT recommendation for SNF but patient wanting to return home upon discharge. Patient stated that she will not go to a SNF but does agree to [facility] services. She has a walker, BSC, and shower chair at home in addition to home O2 at 4L [place]. Contacted patient son, [name], with patient permission and left message. Patient stated that she has used [place] and [facility] in the past for [facility] services and prefers [facility]. Contacted Krsiten with [facility] with preliminary referral. Await orders.   Expected Discharge Date:   (unknown)               Expected Discharge Plan:  [place]  In-House Referral:  Clinical Social Work  Discharge planning Services  CM Consult  Post Acute Care Choice:  Home Health Choice offered to:  NA  DME Arranged:    DME Agency:     HH Arranged:    [place] Agency:     Status of Service:     Medicare Important Message Given:  N/A - LOS <3 / Initial given by admissions Date Medicare IM Given:    Medicare IM give by:    Date Additional Medicare IM Given:    Additional Medicare Important Message give by:     If discussed at [place] of Stay Meetings, dates discussed:    Additional Comments:  Scot Dock, RN 07/13/2014, 3:08 PM

## 2014-07-13 NOTE — Progress Notes (Signed)
Patient ID: Kirsten Flores, female   DOB: [birth year], 81 y.o.   MRN: [id number] [facility] PROGRESS NOTE  Kirsten Flores VZD:[id number] DOB: May 24, 1923 DOA: 07/11/2014 PCP: Garnet Koyanagi, DO  Brief narrative:    81 year old female with past medical history of hypertension, dyslipidemia, memory loss, COPD on home oxygen (2L [place]) hypothyroidism who presented to [facility] ED due to ongoing weakness and mental status changes. On admission, she was found to have UTI and was started on rocephin.  Barrier to discharge: Urine culture growing gram-negative rods. Follow up final urine culture result and adjust antibiotics based on sensitivity report. Anticipate discharge to skilled nursing facility 07/14/2014.  Assessment/Plan:    Principal Problem: Generalized weakness - Likely secondary to UTI - physical therapy recommended skilled nursing facility placement. Patient's son agreeable to skilled nursing facility placement.  Active Problems: Acute encephalopathy - Secondary to UTI and generalized weakness.  - CT head on admission did not show acute intracranial findings - CXR with no evidence of pneumonia   UTI - UA revealed many bacteria but no leukocytes  - Urine culture growing gram-negative rods. Follow up final urine culture results. - Adjust antibiotic based on sensitivity report. Patient is currently on IV Rocephin daily.  Dyslipidemia - Continue Vytorin.  - Continue fenofibrate  Essential hypertension - Continue atenolol and Diovan.  - Blood pressure 126/34, stable.  Hypothyroidism - Continue synthroid.  - TSH on this admission 12.5 so Synthroid dose adjusted.  Hyperkalemia - Secondary to potassium supplementation. Patient has received Kayexalate on the admission. - Potassium normalizes.  Hypernatremia - Likely from dehydration - Patient was initially normal saline which was then changed to D5 water. Follow-up BMP tomorrow morning - Sodium is 147.  Depression - Continue  Zoloft  - Stable.  Morbid obesity, obesity related comorbid conditions  - Body mass index is 36.68 kg/(m^2) - Continue nutritional supplementation      DVT prophylaxis:  - SCD's bilaterally in hospital   Code Status: Full.  Family Communication:  plan of care discussed with the patient's son at the bedside  Disposition Plan: to SNF 07/14/2014   IV access:  Peripheral IV  Procedures and diagnostic studies:    Dg Chest 2 View 07/11/2014 Chronic lung disease with fibrosis. No superimposed acute abnormality.   Electronically Signed   By: Franchot Gallo M.D.   On: 07/11/2014 15:58   Ct Head Wo Contrast 07/11/2014   No acute intracranial abnormality. Stable and largely negative for age noncontrast CT appearance of the brain.   Electronically Signed   By: Genevie Ann M.D.   On: 07/11/2014 16:13    Medical Consultants:  None   Other Consultants:  Physical therapy  IAnti-Infectives:   Rocephin 07/11/2014 -->    Leisa Lenz, MD  [facility] Pager [phone]  Time spent in minutes: 15 minutes  If 7PM-7AM, please contact night-coverage [email or web address] Password [email or web address] 07/13/2014, 1:50 PM   LOS: 2 days    HPI/Subjective: No acute overnight events. Patient reports feeling weak. No nausea or vomiting.  Objective: Filed Vitals:   07/12/14 2104 07/13/14 0520 07/13/14 0619 07/13/14 1317  BP: 136/65 103/83  126/34  Pulse: 64 65  68  Temp: 98.3 F (36.8 C) 97.4 F (36.3 C)  98.8 F (37.1 C)  TempSrc: Oral Oral  Oral  Resp: 20 20    Height:      Weight:   96.4 kg (212 lb 8.4 oz)   SpO2: 95% 93%  99%  Intake/Output Summary (Last 24 hours) at 07/13/14 1350 Last data filed at 07/13/14 0700  Gross per 24 hour  Intake 1067.5 ml  Output      0 ml  Net 1067.5 ml    Exam:   General:  Pt is awake, alert, no acute distress  Cardiovascular: Appreciate S1-S2, regular rhythm  Respiratory: Bilateral air entry, no wheezing  Abdomen: Nontender abdomen, non-distended,  appreciate bowel sounds  Extremities: Pulses palpable, trace lower extremity edema  Neuro: No focal deficits  Data Reviewed: Basic Metabolic Panel:  Recent Labs Lab 07/11/14 1525 07/12/14 0528  NA 147* 147*  K 5.2* 4.3  CL 106 103  CO2 35* 37*  GLUCOSE 113* 108*  BUN 30* 21*  CREATININE 0.96 0.78  CALCIUM 9.7 9.2   Liver Function Tests:  Recent Labs Lab 07/11/14 1525 07/12/14 0528  AST 27 25  ALT 17 18  ALKPHOS 42 38  BILITOT 1.2 1.0  PROT 6.3* 5.7*  ALBUMIN 3.5 3.2*   No results for input(s): LIPASE, AMYLASE in the last 168 hours. No results for input(s): AMMONIA in the last 168 hours. CBC:  Recent Labs Lab 07/11/14 1525 07/12/14 0528  WBC 5.0 4.6  NEUTROABS 3.4  --   HGB 12.2 11.0*  HCT 42.3 38.4  MCV 112.8* 112.6*  PLT 159 138*   Cardiac Enzymes: No results for input(s): CKTOTAL, CKMB, CKMBINDEX, TROPONINI in the last 168 hours. BNP: Invalid input(s): POCBNP CBG:  Recent Labs Lab 07/12/14 0754 07/13/14 0806  GLUCAP 103* 101*    Recent Results (from the past 240 hour(s))  Urine culture     Status: None (Preliminary result)   Collection Time: 07/11/14  4:06 PM  Result Value Ref Range Status   Specimen Description URINE, CATHETERIZED  Final   Special Requests NONE  Final   [place] Count   Final    >=100,000 COLONIES/ML Performed at [facility]    Culture   Final    [place] Performed at [facility]    Report Status PENDING  Incomplete     Scheduled Meds: . atenolol  25 mg Oral Daily  . betaxolol  1 drop Both Eyes BID  . cefTRIAXone (ROCEPHIN)  IV  1 g Intravenous Q24H  . clopidogrel  75 mg Oral Q breakfast  . ezetimibe-simvastatin  1 tablet Oral QPM  . feeding supplement (ENSURE ENLIVE)  237 mL Oral BID BM  . fenofibrate  160 mg Oral Daily  . fesoterodine  8 mg Oral Daily  . gabapentin  600 mg Oral QHS  . irbesartan  75 mg Oral Daily   And  . hydrochlorothiazide  12.5 mg Oral Daily  . levothyroxine   150 mcg Oral QAC breakfast  . multivitamin with minerals  1 tablet Oral Daily  . pantoprazole  40 mg Oral Daily  . sertraline  50 mg Oral Daily   Continuous Infusions: . dextrose 75 mL/hr at 07/13/14 0445

## 2014-07-14 DIAGNOSIS — B9620 Unspecified Escherichia coli [E. coli] as the cause of diseases classified elsewhere: Secondary | ICD-10-CM

## 2014-07-14 LAB — BASIC METABOLIC PANEL
ANION GAP: 8 (ref 5–15)
BUN: 17 mg/dL (ref 6–20)
CALCIUM: 9.2 mg/dL (ref 8.9–10.3)
CO2: 38 mmol/L — AB (ref 22–32)
Chloride: 92 mmol/L — ABNORMAL LOW (ref 101–111)
Creatinine, Ser: 0.75 mg/dL (ref 0.44–1.00)
GFR calc Af Amer: >60 mL/min (ref >60)
Glucose, Bld: 111 mg/dL — ABNORMAL HIGH (ref 65–99)
Potassium: 3.7 mmol/L (ref 3.5–5.1)
Sodium: 138 mmol/L (ref 135–145)

## 2014-07-14 LAB — GLUCOSE, CAPILLARY: GLUCOSE-CAPILLARY: 109 mg/dL — AB (ref 65–99)

## 2014-07-14 LAB — CBC
HCT: 36.2 % (ref 36.0–46.0)
HEMOGLOBIN: 10.8 g/dL — AB (ref 12.0–15.0)
MCH: 32.1 pg (ref 26.0–34.0)
MCHC: 29.8 g/dL — ABNORMAL LOW (ref 30.0–36.0)
MCV: 107.7 fL — AB (ref 78.0–100.0)
PLATELETS: 134 10*3/uL — AB (ref 150–400)
RBC: 3.36 MIL/uL — ABNORMAL LOW (ref 3.87–5.11)
RDW: 13.5 % (ref 11.5–15.5)
WBC: 4.3 10*3/uL (ref 4.0–10.5)

## 2014-07-14 MED ORDER — ENSURE ENLIVE PO LIQD: 237.0000 mL | Freq: Two times a day (BID) | ORAL | Status: DC

## 2014-07-14 MED ORDER — CIPROFLOXACIN HCL 500 MG PO TABS
500.0000 mg | ORAL_TABLET | Freq: Two times a day (BID) | ORAL | Status: DC
Start: 2014-07-14 — End: 2014-07-26

## 2014-07-14 MED ORDER — LEVOTHYROXINE SODIUM 137 MCG PO TABS: 137.0000 ug | ORAL_TABLET | Freq: Every day | ORAL | Status: DC

## 2014-07-14 NOTE — Progress Notes (Signed)
Patient discharged.  Patient three person assist to get to wheelchair.  Pt adamantly refused SNF, reporting "I want to go home."  Pt reports to me that she gets around at home.  Pt was uncooperative in moving while in hospital.  Would not help get dressed for discharge and reports she dresses herself at home.  Son at pt side at discharge.  4L [place].  Respirations even and unlabored.

## 2014-07-14 NOTE — Discharge Summary (Signed)
Physician Discharge Summary  Kirsten Flores JQB:[id number] DOB: November 30, 1923 DOA: 07/11/2014  PCP: Garnet Koyanagi, DO  Admit date: 07/11/2014 Discharge date: 07/14/2014  Recommendations for Outpatient Follow-up:  1. Pt will take Cipro on discharge for 5 days for E.Coli UTI.  Discharge Diagnoses:  Principal Problem:   UTI (lower urinary tract infection) Active Problems:   Hypothyroidism   Dyslipidemia   Essential hypertension   Severe obesity (BMI >= 40)   Hypernatremia   Hyperkalemia   Memory loss    Discharge Condition: stable   Diet recommendation: as tolerated   History of present illness:  81 year old female with past medical history of hypertension, dyslipidemia, memory loss, COPD on home oxygen (2L [place]) hypothyroidism who presented to [facility] ED due to ongoing weakness and mental status changes. On admission, she was found to have UTI and was started on rocephin.  Urine culture grew E.Coli. Pt will be discharged with Cipro for 5 days.   Hospital Course:    Assessment/Plan:    Principal Problem: Generalized weakness - Likely secondary to UTI - Physical therapy recommended skilled nursing facility but pt and her son declined placement to SNF. Pt will be going home with HHPT. Order placed for HHPT.  Active Problems: Acute encephalopathy - Secondary to UTI and generalized weakness.  - CT head on admission did not show acute intracranial findings - CXR with no evidence of pneumonia  - Mental status better this am.  UTI secondary to E.Coli - UA revealed many bacteria but no leukocytes  - Urine culture growing E.Coli, pansensitive. - Cipro on discharge for 5 days.   Dyslipidemia - Continue Vytorin.  - Continue fenofibrate  Essential hypertension - Continue atenolol and Diovan.   Hypothyroidism - Continue synthroid.  - TSH on this admission 12.5 so Synthroid dose adjusted to 137 mch daily.   Hyperkalemia - Secondary to potassium supplementation. Patient  has received Kayexalate on the admission. - Potassium normalizes.  Hypernatremia - Likely from dehydration - Patient was initially normal saline which was then changed to D5 water.  - Sodium improved from 147 to WNL this am.  Depression - Continue Zoloft  - Stable.  Morbid obesity, obesity related comorbid conditions  - Body mass index is 36.68  - Continue nutritional supplementation     DVT prophylaxis:  - SCD's bilaterally in hospital   Code Status: Full.    IV access:  Peripheral IV  Procedures and diagnostic studies:   Dg Chest 2 View 07/11/2014 Chronic lung disease with fibrosis. No superimposed acute abnormality. Electronically Signed By: Franchot Gallo M.D. On: 07/11/2014 15:58   Ct Head Wo Contrast 07/11/2014 No acute intracranial abnormality. Stable and largely negative for age noncontrast CT appearance of the brain. Electronically Signed By: Genevie Ann M.D. On: 07/11/2014 16:13    Medical Consultants:  None   Other Consultants:  Physical therapy  IAnti-Infectives:   Rocephin 07/11/2014 -->    Signed:  Leisa Lenz, MD  [facility] 07/14/2014, 2:55 PM  Pager #: [phone]  Time spent in minutes: less than 30 minutes  Discharge Exam: Filed Vitals:   07/14/14 0510  BP: 145/44  Pulse: 80  Temp: 97.5 F (36.4 C)  Resp: 18   Filed Vitals:   07/13/14 0619 07/13/14 1317 07/13/14 2103 07/14/14 0510  BP:  126/34 140/47 145/44  Pulse:  68 66 80  Temp:  98.8 F (37.1 C) 98.2 F (36.8 C) 97.5 F (36.4 C)  TempSrc:  Oral Oral Axillary  Resp:  18  Height:      Weight: 96.4 kg (212 lb 8.4 oz)     SpO2:  99% 94% 92%    General: Pt is alert, not in acute distress Cardiovascular: Regular rate and rhythm, S1/S2 (+) Respiratory: Clear to auscultation bilaterally, no wheezing, no crackles, no rhonchi Abdominal: Soft, non tender, non distended, bowel sounds +, no guarding Extremities: no edema, no cyanosis, pulses  palpable bilaterally DP and PT Neuro: Grossly nonfocal  Discharge Instructions  Discharge Instructions    Call MD for:  difficulty breathing, headache or visual disturbances    Complete by:  As directed      Call MD for:  persistant nausea and vomiting    Complete by:  As directed      Call MD for:  severe uncontrolled pain    Complete by:  As directed      Diet - low sodium heart healthy    Complete by:  As directed      Increase activity slowly    Complete by:  As directed             Medication List    TAKE these medications        atenolol 25 MG tablet  Commonly known as:  TENORMIN  TAKE 1 BY MOUTH DAILY     atorvastatin 10 MG tablet  Commonly known as:  LIPITOR  Take 1 tablet (10 mg total) by mouth daily.     betaxolol 0.25 % ophthalmic suspension  Commonly known as:  BETOPTIC-S  Place 1 drop into both eyes 2 (two) times daily.     ciprofloxacin 500 MG tablet  Commonly known as:  CIPRO  Take 1 tablet (500 mg total) by mouth 2 (two) times daily.     clopidogrel 75 MG tablet  Commonly known as:  PLAVIX  Take 1 tablet (75 mg total) by mouth daily with breakfast.     ezetimibe-simvastatin 10-20 MG per tablet  Commonly known as:  VYTORIN  Take 1 tablet by mouth every evening.     feeding supplement (ENSURE ENLIVE) Liqd  Take 237 mLs by mouth 2 (two) times daily between meals.     fenofibrate 145 MG tablet  Commonly known as:  TRICOR  TAKE 1 BY MOUTH DAILY     gabapentin 600 MG tablet  Commonly known as:  NEURONTIN  Take 1 tablet (600 mg total) by mouth at bedtime.     HYDROcodone-acetaminophen 5-325 MG per tablet  Commonly known as:  NORCO/VICODIN  Take 1 tablet by mouth every 6 (six) hours as needed for moderate pain.     levothyroxine 137 MCG tablet  Commonly known as:  SYNTHROID, LEVOTHROID  Take 1 tablet (137 mcg total) by mouth daily.     multivitamin with minerals Tabs tablet  Take 1 tablet by mouth daily.     pantoprazole 40 MG tablet   Commonly known as:  PROTONIX  Take 1 tablet (40 mg total) by mouth daily.     potassium chloride SA 20 MEQ tablet  Commonly known as:  K-DUR,KLOR-CON  TAKE 1 BY MOUTH DAILY     sertraline 50 MG tablet  Commonly known as:  ZOLOFT  Take 1 tablet (50 mg total) by mouth daily.     tolterodine 4 MG 24 hr capsule  Commonly known as:  DETROL LA  Take 1 capsule (4 mg total) by mouth daily.     TYLENOL ARTHRITIS PAIN 650 MG CR tablet  Generic drug:  acetaminophen  Take 1,300 mg by mouth 2 (two) times daily.     valsartan-hydrochlorothiazide 160-25 MG per tablet  Commonly known as:  DIOVAN-HCT  Take 0.5 tablets by mouth daily.           Follow-up Information    Follow up with Garnet Koyanagi, DO. Schedule an appointment as soon as possible for a visit in 1 week.   Specialty:  Family Medicine   Why:  Follow up appt after recent hospitalization   Contact information:   [address] [place] [address] [phone]        The results of significant diagnostics from this hospitalization (including imaging, microbiology, ancillary and laboratory) are listed below for reference.    Significant Diagnostic Studies: Dg Chest 2 View  07/11/2014   CLINICAL DATA:  Short of breath  EXAM: CHEST  2 VIEW  COMPARISON:  12/20/2013  FINDINGS: Chronic lung disease with increased lung markings due to fibrosis, unchanged from the prior study. Negative for infiltrate or effusion or mass lesion.  Severe compression fracture of approximately T12 is unchanged from the prior study.  IMPRESSION: Chronic lung disease with fibrosis. No superimposed acute abnormality.   Electronically Signed   By: Franchot Gallo M.D.   On: 07/11/2014 15:58   Ct Head Wo Contrast  07/11/2014   CLINICAL DATA:  81 year-old female with new onset weakness, altered mental status. Initial encounter.  EXAM: CT HEAD WITHOUT CONTRAST  TECHNIQUE: Contiguous axial images were obtained from the base of the skull  through the vertex without intravenous contrast.  COMPARISON:  Brain MRI 07/21/2013 and earlier.  FINDINGS: Chronic small right maxillary sinus mucous retention cyst. Other Visualized paranasal sinuses and mastoids are clear. No acute osseous abnormality identified.  No acute orbit or scalp soft tissue findings.  Calcified atherosclerosis at the skull base.  Stable cerebral volume. Stable ventricle size and configuration. No midline shift, mass effect, or evidence of intracranial mass lesion. Gray-white matter differentiation is stable and normal for age. No suspicious intracranial vascular hyperdensity. No acute intracranial hemorrhage identified. No evidence of cortically based acute infarction identified.  IMPRESSION: No acute intracranial abnormality. Stable and largely negative for age noncontrast CT appearance of the brain.   Electronically Signed   By: Genevie Ann M.D.   On: 07/11/2014 16:13    Microbiology: Recent Results (from the past 240 hour(s))  Urine culture     Status: None   Collection Time: 07/11/14  4:06 PM  Result Value Ref Range Status   Specimen Description URINE, CATHETERIZED  Final   Special Requests NONE  Final   Colony Count   Final    >=100,000 COLONIES/ML Performed at [facility]    Culture   Final    ESCHERICHIA COLI Performed at [facility]    Report Status 07/13/2014 FINAL  Final   Organism ID, Bacteria ESCHERICHIA COLI  Final      Susceptibility   Escherichia coli - MIC*    AMPICILLIN 4 SENSITIVE Sensitive     CEFAZOLIN <=4 SENSITIVE Sensitive     CEFTRIAXONE <=1 SENSITIVE Sensitive     CIPROFLOXACIN <=0.25 SENSITIVE Sensitive     GENTAMICIN <=1 SENSITIVE Sensitive     LEVOFLOXACIN <=0.12 SENSITIVE Sensitive     NITROFURANTOIN <=16 SENSITIVE Sensitive     TOBRAMYCIN <=1 SENSITIVE Sensitive     TRIMETH/SULFA <=20 SENSITIVE Sensitive     PIP/TAZO <=4 SENSITIVE Sensitive     * ESCHERICHIA COLI  Labs: Basic Metabolic Panel:  Recent  Labs Lab 07/11/14 1525 07/12/14 0528 07/14/14 0532  NA 147* 147* 138  K 5.2* 4.3 3.7  CL 106 103 92*  CO2 35* 37* 38*  GLUCOSE 113* 108* 111*  BUN 30* 21* 17  CREATININE 0.96 0.78 0.75  CALCIUM 9.7 9.2 9.2   Liver Function Tests:  Recent Labs Lab 07/11/14 1525 07/12/14 0528  AST 27 25  ALT 17 18  ALKPHOS 42 38  BILITOT 1.2 1.0  PROT 6.3* 5.7*  ALBUMIN 3.5 3.2*   No results for input(s): LIPASE, AMYLASE in the last 168 hours. No results for input(s): AMMONIA in the last 168 hours. CBC:  Recent Labs Lab 07/11/14 1525 07/12/14 0528 07/14/14 0532  WBC 5.0 4.6 4.3  NEUTROABS 3.4  --   --   HGB 12.2 11.0* 10.8*  HCT 42.3 38.4 36.2  MCV 112.8* 112.6* 107.7*  PLT 159 138* 134*   Cardiac Enzymes: No results for input(s): CKTOTAL, CKMB, CKMBINDEX, TROPONINI in the last 168 hours. BNP: BNP (last 3 results)  Recent Labs  07/11/14 1525  BNP 282.5*    ProBNP (last 3 results)  Recent Labs  07/20/13 1656  PROBNP 982.2*    CBG:  Recent Labs Lab 07/12/14 0754 07/13/14 0806 07/14/14 0733  GLUCAP 103* 101* 109*

## 2014-07-14 NOTE — Care Management Note (Signed)
Case Management Note  Patient Details  Name: Kirsten Flores MRN: [id number] Date of Birth: 08-28-1923  Subjective/Objective:                    Action/Plan:  Home with [place], lives with son, [name] Expected Discharge Date:   (unknown)               Expected Discharge Plan:  [place]  In-House Referral:  Clinical Social Work  Discharge planning Services  CM Consult  Post Acute Care Choice:  Home Health Choice offered to:  NA, Adult Children  DME Arranged:    DME Agency:     HH Arranged:  RN, PT, Nurse's Aide [place] Agency:  [facility]  Status of Service:  Completed, signed off  Medicare Important Message Given:  Yes Date Medicare IM Given:  07/14/14 Medicare IM give by:  Jonnie Finner RN CCM  Date Additional Medicare IM Given:    Additional Medicare Important Message give by:     If discussed at [place] of Stay Meetings, dates discussed:    Additional Comments: NCM spoke to pt and gave permission to speak to son, Kirsten Flores [phone]. Requesting [facility] for HH. Contacted [facility] rep for [facility]. Pt has oxygen, wheelchair, RW, tub seat, and bedside commode. No DME requested at this time.    Erenest Rasher, RN 07/14/2014, 3:30 PM

## 2014-07-14 NOTE — Discharge Instructions (Signed)

## 2014-07-16 ENCOUNTER — Telehealth: Payer: Self-pay | Admitting: Family Medicine

## 2014-07-16 DIAGNOSIS — N390 Urinary tract infection, site not specified: Secondary | ICD-10-CM

## 2014-07-16 NOTE — Telephone Encounter (Signed)
Caller name: Juli, Odom  Relation to pt: son Call back number:[phone]   Reason for call:  Son stated pt was seen in the hospital for UTI and would like clinical advice in regards to lab work if any needs to be done. Son stated it takes 3 ppl to reposition pt and its hard to bring her in for an appointment. Seeking instructions from MD that Natalia can conduct. Please advise

## 2014-07-16 NOTE — Telephone Encounter (Signed)
I need a diagnosis code.     KP

## 2014-07-16 NOTE — Telephone Encounter (Signed)
Order faxed.     KP

## 2014-07-16 NOTE — Telephone Encounter (Signed)
Caller: Kirsten Flores, son Ph#: [phone], home # til 9:00am         or [phone] cell # after 9:00am  Son would like to get a hospital bed ordered for his mother to have in the home. Please order thru [facility]. He states RX must say pt needs head elevation above 30 degrees, and that pt immobile. [place] Fax# [phone]. He said he needs to schedule appt as well to discuss pts health but she won't actually be at the appt. Please advise how to schedule that appt if needed.

## 2014-07-16 NOTE — Telephone Encounter (Signed)
To Md to advise     KP

## 2014-07-16 NOTE — Telephone Encounter (Signed)
Fall risk or frequent falls uti weakness

## 2014-07-16 NOTE — Telephone Encounter (Signed)
Ok to order bed

## 2014-07-16 NOTE — Telephone Encounter (Signed)
If they can get a clean catch urine and send it for UA c and S

## 2014-07-16 NOTE — Telephone Encounter (Signed)
Pleas advise     Kirsten Flores

## 2014-07-16 NOTE — Telephone Encounter (Signed)
Order Faxed and [name] has been made aware.     KP

## 2014-07-17 ENCOUNTER — Telehealth: Payer: Self-pay | Admitting: *Deleted

## 2014-07-17 ENCOUNTER — Telehealth: Payer: Self-pay | Admitting: Family Medicine

## 2014-07-17 NOTE — Telephone Encounter (Signed)
Caller name: Claiborne Billings from [place] [facility]  Relation to pt: Physical Therapist  Call back number: [phone]   Reason for call:   Requesting verbal orders for social worker for  rehab placement

## 2014-07-17 NOTE — Telephone Encounter (Signed)
Verbal given.     KP 

## 2014-07-17 NOTE — Telephone Encounter (Signed)
[facility]  Can be [phone]  Fax [phone]   Reason for call: Order recvd needing office notes- speaking to the need of all the equip ordered. Requesting notes specifically for each piece of equip ordered. Height and weight/ and insurance info in notes as well.

## 2014-07-17 NOTE — Telephone Encounter (Signed)
Pt is declining quickly-- unable to get to [facility] on own.   DNR signed and left for son to pick up.

## 2014-07-17 NOTE — Telephone Encounter (Signed)
Transition Care Management Follow-up Telephone Call   How have you been since you were released from the hospital? "She's doing better"    Do you understand why you were in the hospital? YES- urinary tract infection    Do you understand the discharge instrcutions? YES   Items Reviewed:  Medications reviewed: YES  Allergies reviewed: YES   Dietary changes reviewed: YES  Referrals reviewed: YES   Functional Questionnaire:   Activities of Daily Living (ADLs):   She states they are independent in the following: none- son states patient is completely immobile  States they require assistance with the following: cooking, dressing, medications, turning in bed    Any transportation issues/concerns?: YES- son states patient is unable to get patient out of bed, so would require ambulance ride    Any patient concerns? YES- patient would like a hospital bed (Dr. Etter Sjogren ordered).  [facility] for [facility].  He also states he needs a copy of DNR form for transportation (is coming to office today to get this).    Confirmed importance and date/time of follow-up visits scheduled: Patient's son declines appointment- he states she is completely immobile and he will not be able to take her to office    Confirmed with patient if condition begins to worsen call PCP or go to the ER.  Patient was given the Call-a-Nurse line [phone]: YES

## 2014-07-17 NOTE — Telephone Encounter (Signed)
DNR left at check-in for randy to pick up.      KP

## 2014-07-17 NOTE — Telephone Encounter (Signed)
Juluis Rainier- spoke with patient's son and he states that they will not be able to come to a follow-up appointment because she requires at least 3 people to get her out of bed.    They are waiting on the hospital bed (already ordered), which he thinks will help and he is coming to the office today to get a copy of DNR form for transportation.

## 2014-07-17 NOTE — Telephone Encounter (Signed)
Demographics, orders and office notes have been faxed.      KP

## 2014-07-18 NOTE — Telephone Encounter (Signed)
Patient son states that [facility] is requiring additional information regarding this.   Minna Merritts at [place] ext [phone]

## 2014-07-18 NOTE — Telephone Encounter (Signed)
FYI

## 2014-07-19 ENCOUNTER — Telehealth: Payer: Self-pay | Admitting: Family Medicine

## 2014-07-19 ENCOUNTER — Telehealth: Payer: Self-pay

## 2014-07-19 MED ORDER — NYSTATIN 100000 UNIT/ML MT SUSP: 5.0000 mL | Freq: Four times a day (QID) | OROMUCOSAL | Status: DC

## 2014-07-19 NOTE — Telephone Encounter (Signed)
Duplicate message  KP 

## 2014-07-19 NOTE — Telephone Encounter (Signed)
Caller name: [name] with [facility]. Can be reached: [phone]  Reason for call: Returning your call. Best ph# above.

## 2014-07-19 NOTE — Telephone Encounter (Signed)
MSG left for Minna Merritts.      KP

## 2014-07-19 NOTE — Telephone Encounter (Signed)
Spoke with [name] and she stated that the orders are great but the office notes need to reflect the need for the Bed per Medicare guidelines.

## 2014-07-19 NOTE — Telephone Encounter (Signed)
I advised that he would need to bring his mother in to an appointment for a face to face in order for her to get the Hospital bed, and he started to get upset, he said he would have to get a group of men to lift her, I explained the reason and the insurance company will not pay without it. He verbalized understanding. Dr. Etter Sjogren advised we can try hospice and if she qualifies then they can complete the face to face for her needs. I made Kirsten Flores aware and he agreed, Faxed to [facility].     KP

## 2014-07-19 NOTE — Telephone Encounter (Signed)
I have not seen her she just got out of hospital --- will hospital notes help

## 2014-07-19 NOTE — Telephone Encounter (Signed)
We can call in nystatin swish and swallow 5 ml swish and spit qid  #150 cc But she may need face to face for home health

## 2014-07-19 NOTE — Telephone Encounter (Signed)
Engineer, agricultural) with [facility] called to report patient appears to have thrush. She has been on Cipro since 07/11/2014, prescribed for UTI via the ED. Would you like to see the patient in the office or would you like to treat? Please advise. Notify family.

## 2014-07-19 NOTE — Telephone Encounter (Signed)
She will need face to face unfortunately

## 2014-07-19 NOTE — Telephone Encounter (Signed)
Rx faxed and Kirsten Flores has been made aware.      KP

## 2014-07-19 NOTE — Telephone Encounter (Signed)
There is no supporting documents of the need for a Hospital bed in the notes.

## 2014-07-19 NOTE — Telephone Encounter (Signed)
The above medical condition requires: Patient requires the ability to reposition frequently      Head must be elevated greater than: 30 degrees     Bed type Semi-electric     Hoyer Lift Yes     Trapeze Bar Yes     Support Surface: Alternating Pressure Pad and Pump     She needs to have this information in the notes along with the need for the equipment.       KP

## 2014-07-20 ENCOUNTER — Telehealth: Payer: Self-pay | Admitting: Family Medicine

## 2014-07-20 NOTE — Telephone Encounter (Signed)
Patient son requesting callback anytime after 3pm regarding appointment.

## 2014-07-20 NOTE — Telephone Encounter (Signed)
Caller name: Holli Humbles  Relation to pt: son Call back number:[phone]   Reason for call:  Holli Humbles calling checking on the status of previous message

## 2014-07-20 NOTE — Telephone Encounter (Signed)
Spoke with son and he is going to get Transportation, apt is scheduled for Friday at 2:45.    KP

## 2014-07-20 NOTE — Telephone Encounter (Signed)
To MD for review     KP 

## 2014-07-20 NOTE — Telephone Encounter (Signed)
Caller name: Jocelyn Lamer from [facility]  Call back number: [phone]    Reason for call:   FYI- As per Jocelyn Lamer from [facility] of [facility] states son Ronniesha, [place] would like pt to  remain with [facility]. Son  was advised by the hospital that pt should do rehab therefore he is not ready for end of life care. [facility] did advise son they will be available for non emergent transportation to appointments.   Son happen to call right after stating he would like to discuss some concerns and make an appointment the next available is not until  07/27/14 and would like to discuss some concerns. Advised pt MD is in meetings all next week the next available is not until 07/27/14. Pt was adamant about speaking with Maudie Mercury before scheduling. Yarianna Varble (son) best # [phone]

## 2014-07-20 NOTE — Telephone Encounter (Signed)
I'm not in office next week---  I'm not sure what else he wants

## 2014-07-22 ENCOUNTER — Emergency Department (HOSPITAL_BASED_OUTPATIENT_CLINIC_OR_DEPARTMENT_OTHER): Payer: Medicare Other

## 2014-07-22 ENCOUNTER — Inpatient Hospital Stay (HOSPITAL_BASED_OUTPATIENT_CLINIC_OR_DEPARTMENT_OTHER)
Admission: EM | Admit: 2014-07-22 | Discharge: 2014-07-26 | DRG: 190 | Disposition: A | Discharge status: Home / Self Care | Payer: Medicare Other | Attending: Family Medicine | Admitting: Family Medicine

## 2014-07-22 ENCOUNTER — Encounter (HOSPITAL_BASED_OUTPATIENT_CLINIC_OR_DEPARTMENT_OTHER): Payer: Self-pay | Admitting: Family Medicine

## 2014-07-22 DIAGNOSIS — J441 Chronic obstructive pulmonary disease with (acute) exacerbation: Secondary | ICD-10-CM | POA: Insufficient documentation

## 2014-07-22 DIAGNOSIS — Z66 Do not resuscitate: Secondary | ICD-10-CM | POA: Diagnosis present

## 2014-07-22 DIAGNOSIS — L8990 Pressure ulcer of unspecified site, unspecified stage: Secondary | ICD-10-CM | POA: Insufficient documentation

## 2014-07-22 DIAGNOSIS — E538 Deficiency of other specified B group vitamins: Secondary | ICD-10-CM | POA: Diagnosis present

## 2014-07-22 DIAGNOSIS — Z87440 Personal history of urinary (tract) infections: Secondary | ICD-10-CM | POA: Diagnosis not present

## 2014-07-22 DIAGNOSIS — Z90710 Acquired absence of both cervix and uterus: Secondary | ICD-10-CM

## 2014-07-22 DIAGNOSIS — Z9981 Dependence on supplemental oxygen: Secondary | ICD-10-CM | POA: Diagnosis not present

## 2014-07-22 DIAGNOSIS — J9622 Acute and chronic respiratory failure with hypercapnia: Secondary | ICD-10-CM | POA: Diagnosis not present

## 2014-07-22 DIAGNOSIS — Z9049 Acquired absence of other specified parts of digestive tract: Secondary | ICD-10-CM | POA: Diagnosis present

## 2014-07-22 DIAGNOSIS — R404 Transient alteration of awareness: Secondary | ICD-10-CM

## 2014-07-22 DIAGNOSIS — E039 Hypothyroidism, unspecified: Secondary | ICD-10-CM | POA: Diagnosis present

## 2014-07-22 DIAGNOSIS — R0602 Shortness of breath: Secondary | ICD-10-CM

## 2014-07-22 DIAGNOSIS — N179 Acute kidney failure, unspecified: Secondary | ICD-10-CM

## 2014-07-22 DIAGNOSIS — E875 Hyperkalemia: Secondary | ICD-10-CM | POA: Diagnosis present

## 2014-07-22 DIAGNOSIS — Z87891 Personal history of nicotine dependence: Secondary | ICD-10-CM

## 2014-07-22 DIAGNOSIS — I248 Other forms of acute ischemic heart disease: Secondary | ICD-10-CM | POA: Diagnosis present

## 2014-07-22 DIAGNOSIS — Z6841 Body Mass Index (BMI) 40.0 and over, adult: Secondary | ICD-10-CM | POA: Diagnosis not present

## 2014-07-22 DIAGNOSIS — R0600 Dyspnea, unspecified: Secondary | ICD-10-CM | POA: Diagnosis not present

## 2014-07-22 DIAGNOSIS — I129 Hypertensive chronic kidney disease with stage 1 through stage 4 chronic kidney disease, or unspecified chronic kidney disease: Secondary | ICD-10-CM | POA: Diagnosis present

## 2014-07-22 DIAGNOSIS — T380X5A Adverse effect of glucocorticoids and synthetic analogues, initial encounter: Secondary | ICD-10-CM | POA: Diagnosis present

## 2014-07-22 DIAGNOSIS — N183 Chronic kidney disease, stage 3 (moderate): Secondary | ICD-10-CM | POA: Diagnosis present

## 2014-07-22 DIAGNOSIS — E785 Hyperlipidemia, unspecified: Secondary | ICD-10-CM | POA: Diagnosis present

## 2014-07-22 DIAGNOSIS — F0390 Unspecified dementia without behavioral disturbance: Secondary | ICD-10-CM | POA: Diagnosis present

## 2014-07-22 DIAGNOSIS — M8580 Other specified disorders of bone density and structure, unspecified site: Secondary | ICD-10-CM | POA: Diagnosis present

## 2014-07-22 DIAGNOSIS — M1990 Unspecified osteoarthritis, unspecified site: Secondary | ICD-10-CM | POA: Diagnosis present

## 2014-07-22 DIAGNOSIS — Z881 Allergy status to other antibiotic agents status: Secondary | ICD-10-CM | POA: Diagnosis not present

## 2014-07-22 DIAGNOSIS — R001 Bradycardia, unspecified: Secondary | ICD-10-CM | POA: Diagnosis present

## 2014-07-22 DIAGNOSIS — Z888 Allergy status to other drugs, medicaments and biological substances status: Secondary | ICD-10-CM

## 2014-07-22 DIAGNOSIS — R0689 Other abnormalities of breathing: Secondary | ICD-10-CM | POA: Diagnosis present

## 2014-07-22 DIAGNOSIS — F329 Major depressive disorder, single episode, unspecified: Secondary | ICD-10-CM | POA: Diagnosis present

## 2014-07-22 LAB — URINALYSIS, ROUTINE W REFLEX MICROSCOPIC
BILIRUBIN URINE: NEGATIVE
Glucose, UA: NEGATIVE mg/dL
Hgb urine dipstick: NEGATIVE
Ketones, ur: NEGATIVE mg/dL
Nitrite: NEGATIVE
Protein, ur: NEGATIVE mg/dL
Specific Gravity, Urine: 1.017 (ref 1.005–1.030)
Urobilinogen, UA: 0.2 mg/dL (ref 0.0–1.0)
pH: 5.5 (ref 5.0–8.0)

## 2014-07-22 LAB — TROPONIN I: Troponin I: 0.91 ng/mL (ref <0.031)

## 2014-07-22 LAB — CBC WITH DIFFERENTIAL/PLATELET
BASOS ABS: 0 10*3/uL (ref 0.0–0.1)
BLASTS: 0 %
Band Neutrophils: 0 % (ref 0–10)
Basophils Relative: 0 % (ref 0–1)
EOS PCT: 3 % (ref 0–5)
Eosinophils Absolute: 0.2 10*3/uL (ref 0.0–0.7)
HCT: 37.4 % (ref 36.0–46.0)
Hemoglobin: 11.3 g/dL — ABNORMAL LOW (ref 12.0–15.0)
LYMPHS ABS: 1.1 10*3/uL (ref 0.7–4.0)
Lymphocytes Relative: 16 % (ref 12–46)
MCH: 32.9 pg (ref 26.0–34.0)
MCHC: 30.2 g/dL (ref 30.0–36.0)
MCV: 109 fL — AB (ref 78.0–100.0)
MONOS PCT: 3 % (ref 3–12)
MYELOCYTES: 1 %
Metamyelocytes Relative: 2 %
Monocytes Absolute: 0.2 10*3/uL (ref 0.1–1.0)
Neutro Abs: 5.3 10*3/uL (ref 1.7–7.7)
Neutrophils Relative %: 75 % (ref 43–77)
Other: 0 %
Platelets: 176 10*3/uL (ref 150–400)
Promyelocytes Absolute: 0 %
RBC: 3.43 MIL/uL — AB (ref 3.87–5.11)
RDW: 13.5 % (ref 11.5–15.5)
WBC: 6.8 10*3/uL (ref 4.0–10.5)
nRBC: 0 /100 WBC

## 2014-07-22 LAB — BLOOD GAS, ARTERIAL
Acid-Base Excess: 6.8 mmol/L — ABNORMAL HIGH (ref 0.0–2.0)
BICARBONATE: 33.4 meq/L — AB (ref 20.0–24.0)
DRAWN BY: 362771
O2 CONTENT: 6 L/min
O2 Saturation: 98.3 %
PCO2 ART: 74.8 mmHg — AB (ref 35.0–45.0)
Patient temperature: 98.6
TCO2: 35.7 mmol/L (ref 0–100)
pH, Arterial: 7.272 — ABNORMAL LOW (ref 7.350–7.450)
pO2, Arterial: 135 mmHg — ABNORMAL HIGH (ref 80.0–100.0)

## 2014-07-22 LAB — I-STAT ARTERIAL BLOOD GAS, ED
ACID-BASE EXCESS: 9 mmol/L — AB (ref 0.0–2.0)
Acid-Base Excess: 8 mmol/L — ABNORMAL HIGH (ref 0.0–2.0)
BICARBONATE: 37.2 meq/L — AB (ref 20.0–24.0)
Bicarbonate: 39.7 mEq/L — ABNORMAL HIGH (ref 20.0–24.0)
O2 SAT: 89 %
O2 Saturation: 94 %
PCO2 ART: 92.1 mmHg — AB (ref 35.0–45.0)
PH ART: 7.241 — AB (ref 7.350–7.450)
PO2 ART: 86 mmHg (ref 80.0–100.0)
TCO2: 43 mmol/L (ref 0–100)
TCO2: 40 mmol/L (ref 0–100)
pCO2 arterial: 79.5 mmHg (ref 35.0–45.0)
pH, Arterial: 7.276 — ABNORMAL LOW (ref 7.350–7.450)
pO2, Arterial: 66 mmHg — ABNORMAL LOW (ref 80.0–100.0)

## 2014-07-22 LAB — BASIC METABOLIC PANEL
Anion gap: 6 (ref 5–15)
BUN: 45 mg/dL — ABNORMAL HIGH (ref 6–20)
CO2: 35 mmol/L — AB (ref 22–32)
Calcium: 10.1 mg/dL (ref 8.9–10.3)
Chloride: 103 mmol/L (ref 101–111)
Creatinine, Ser: 1.41 mg/dL — ABNORMAL HIGH (ref 0.44–1.00)
GFR calc Af Amer: 37 mL/min — ABNORMAL LOW (ref >60)
GFR, EST NON AFRICAN AMERICAN: 32 mL/min — AB (ref >60)
GLUCOSE: 109 mg/dL — AB (ref 65–99)
Potassium: 5.4 mmol/L — ABNORMAL HIGH (ref 3.5–5.1)
Sodium: 144 mmol/L (ref 135–145)

## 2014-07-22 LAB — MRSA PCR SCREENING: MRSA BY PCR: NEGATIVE

## 2014-07-22 LAB — BRAIN NATRIURETIC PEPTIDE: B Natriuretic Peptide: 715.8 pg/mL — ABNORMAL HIGH (ref 0.0–100.0)

## 2014-07-22 LAB — URINE MICROSCOPIC-ADD ON

## 2014-07-22 LAB — I-STAT CG4 LACTIC ACID, ED

## 2014-07-22 MED ORDER — ONDANSETRON HCL 4 MG PO TABS: 4.0000 mg | ORAL_TABLET | Freq: Four times a day (QID) | ORAL | Status: DC | PRN

## 2014-07-22 MED ORDER — ALBUTEROL SULFATE (2.5 MG/3ML) 0.083% IN NEBU
2.5000 mg | INHALATION_SOLUTION | Freq: Four times a day (QID) | RESPIRATORY_TRACT | Status: DC
  Administered 2014-07-22 – 2014-07-23 (×5): 2.5 mg via RESPIRATORY_TRACT
  Filled 2014-07-22 (×5): qty 3

## 2014-07-22 MED ORDER — METHYLPREDNISOLONE SODIUM SUCC 40 MG IJ SOLR
40.0000 mg | Freq: Four times a day (QID) | INTRAMUSCULAR | Status: DC
  Administered 2014-07-22 – 2014-07-23 (×3): 40 mg via INTRAVENOUS
  Filled 2014-07-22 (×7): qty 1

## 2014-07-22 MED ORDER — ENSURE ENLIVE PO LIQD
237.0000 mL | Freq: Two times a day (BID) | ORAL | Status: DC
  Administered 2014-07-23 – 2014-07-26 (×8): 237 mL via ORAL

## 2014-07-22 MED ORDER — SODIUM CHLORIDE 0.9 % IV SOLN: INTRAVENOUS | Status: DC

## 2014-07-22 MED ORDER — PANTOPRAZOLE SODIUM 40 MG PO TBEC
40.0000 mg | DELAYED_RELEASE_TABLET | Freq: Every day | ORAL | Status: DC
  Administered 2014-07-22 – 2014-07-26 (×5): 40 mg via ORAL
  Filled 2014-07-22 (×7): qty 1

## 2014-07-22 MED ORDER — ONDANSETRON HCL 4 MG/2ML IJ SOLN: 4.0000 mg | Freq: Four times a day (QID) | INTRAMUSCULAR | Status: DC | PRN

## 2014-07-22 MED ORDER — CLOPIDOGREL BISULFATE 75 MG PO TABS
75.0000 mg | ORAL_TABLET | Freq: Every day | ORAL | Status: DC
  Administered 2014-07-23 – 2014-07-26 (×4): 75 mg via ORAL
  Filled 2014-07-22 (×5): qty 1

## 2014-07-22 MED ORDER — ADULT MULTIVITAMIN W/MINERALS CH
1.0000 | ORAL_TABLET | Freq: Every day | ORAL | Status: DC
  Administered 2014-07-23 – 2014-07-26 (×4): 1 via ORAL
  Filled 2014-07-22 (×4): qty 1

## 2014-07-22 MED ORDER — METHYLPREDNISOLONE SODIUM SUCC 125 MG IJ SOLR
125.0000 mg | Freq: Once | INTRAMUSCULAR | Status: AC
  Administered 2014-07-22: 125 mg via INTRAVENOUS
  Filled 2014-07-22: qty 2

## 2014-07-22 MED ORDER — ATORVASTATIN CALCIUM 10 MG PO TABS
10.0000 mg | ORAL_TABLET | Freq: Every day | ORAL | Status: DC
  Administered 2014-07-22 – 2014-07-26 (×5): 10 mg via ORAL
  Filled 2014-07-22 (×5): qty 1

## 2014-07-22 MED ORDER — SODIUM CHLORIDE 0.9 % IJ SOLN
3.0000 mL | Freq: Two times a day (BID) | INTRAMUSCULAR | Status: DC
  Administered 2014-07-22 – 2014-07-26 (×8): 3 mL via INTRAVENOUS

## 2014-07-22 MED ORDER — NYSTATIN 100000 UNIT/ML MT SUSP
5.0000 mL | Freq: Four times a day (QID) | OROMUCOSAL | Status: DC
  Administered 2014-07-22 – 2014-07-26 (×14): 500000 [IU] via ORAL
  Filled 2014-07-22 (×17): qty 5

## 2014-07-22 MED ORDER — ALBUTEROL SULFATE (2.5 MG/3ML) 0.083% IN NEBU: 2.5000 mg | INHALATION_SOLUTION | RESPIRATORY_TRACT | Status: DC | PRN

## 2014-07-22 MED ORDER — IPRATROPIUM-ALBUTEROL 0.5-2.5 (3) MG/3ML IN SOLN
3.0000 mL | RESPIRATORY_TRACT | Status: DC
  Administered 2014-07-22: 3 mL via RESPIRATORY_TRACT
  Filled 2014-07-22: qty 3

## 2014-07-22 MED ORDER — BETAXOLOL HCL 0.25 % OP SUSP
1.0000 [drp] | Freq: Two times a day (BID) | OPHTHALMIC | Status: DC
  Administered 2014-07-22 – 2014-07-26 (×8): 1 [drp] via OPHTHALMIC
  Filled 2014-07-22: qty 10

## 2014-07-22 MED ORDER — LEVOTHYROXINE SODIUM 137 MCG PO TABS
137.0000 ug | ORAL_TABLET | Freq: Every day | ORAL | Status: DC
  Administered 2014-07-23 – 2014-07-24 (×2): 137 ug via ORAL
  Filled 2014-07-22 (×3): qty 1

## 2014-07-22 MED ORDER — IPRATROPIUM BROMIDE 0.02 % IN SOLN
0.5000 mg | Freq: Four times a day (QID) | RESPIRATORY_TRACT | Status: DC
  Administered 2014-07-22 – 2014-07-23 (×5): 0.5 mg via RESPIRATORY_TRACT
  Filled 2014-07-22 (×5): qty 2.5

## 2014-07-22 MED ORDER — HEPARIN SODIUM (PORCINE) 5000 UNIT/ML IJ SOLN
5000.0000 [IU] | Freq: Three times a day (TID) | INTRAMUSCULAR | Status: DC
  Administered 2014-07-22 – 2014-07-26 (×12): 5000 [IU] via SUBCUTANEOUS
  Filled 2014-07-22 (×13): qty 1

## 2014-07-22 MED ORDER — ACETAMINOPHEN 325 MG PO TABS
650.0000 mg | ORAL_TABLET | Freq: Four times a day (QID) | ORAL | Status: DC | PRN
  Administered 2014-07-25: 650 mg via ORAL
  Filled 2014-07-22: qty 2

## 2014-07-22 MED ORDER — SERTRALINE HCL 50 MG PO TABS
50.0000 mg | ORAL_TABLET | Freq: Every day | ORAL | Status: DC
  Administered 2014-07-22 – 2014-07-26 (×5): 50 mg via ORAL
  Filled 2014-07-22 (×5): qty 1

## 2014-07-22 MED ORDER — ACETAMINOPHEN 650 MG RE SUPP: 650.0000 mg | Freq: Four times a day (QID) | RECTAL | Status: DC | PRN

## 2014-07-22 NOTE — H&P (Signed)
[facility] History and Physical  BERMA HARTS TIR:[id number] DOB: December 11, 1923 DOA: 07/22/2014  Referring physician: er PCP: Garnet Koyanagi, DO   Chief Complaint: AMS/SOB  HPI: Kirsten Flores is a 81 y.o. 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  Patient was recently in [facility] with UTI.  SNF was recommended but patient and family wanted to go home.  She has not done well at home since discharge.  She lives with her son.  Yesterday her sats were in the 60s-80s per the son who has a pulse ox at home.  Son also states that patient has been hallucinating.  Son was worried he had cross threaded the patient's O2.  Son admits he has not been able to handle the patient at home   Initially in the ER, patient was not very responsive and 60% on 3L.  ABG was done that showed hypercapnia she is a DNR/DNI so she was placed on BIpap.  ABG improved.  O2 was weaned down to 2L to keep sats 88-92%.    When patient arrived at [facility], she was awake and responsive, answering questions.   She was off Bipap. She was given steroid, albuterol by EMS/the ER.   No fever, always cold.     Review of Systems:  All systems reviewed, negative unless stated above.   Past Medical History  Diagnosis Date  . Hyperlipidemia   . Hypertension   . Osteopenia   . Thyroid disease   . B12 deficiency   . Arthritis   . Memory loss   . Knee pain     RIGHT  . Gait disturbance    Past Surgical History  Procedure Laterality Date  . Appendectomy    . Cholecystectomy    . Abdominal hysterectomy    . Oophorectomy     Social History:  reports that she quit smoking about 43 years ago. Her smoking use included Cigarettes. She has a 50 pack-year smoking history. She has never used smokeless tobacco. She reports that she drinks alcohol. She reports that she does not use illicit drugs.  Allergies  Allergen Reactions  . Diltiazem Hcl Other (See Comments)   Unknown reaction  . Neomycin Other (See Comments)    Unknown reaction (a long time ago)    Family History  Problem Relation Age of Onset  . Colon cancer    . Melanoma    . Cancer Other     COLON...1ST DEGREE RELATIVE    Prior to Admission medications   Medication Sig Start Date End Date Taking? Authorizing Provider  MetroNIDAZOLE (FLAGYL PO) Take 5 mLs by mouth.   Yes Historical Provider, MD  acetaminophen (TYLENOL ARTHRITIS PAIN) 650 MG CR tablet Take 1,300 mg by mouth 2 (two) times daily.    Historical Provider, MD  atenolol (TENORMIN) 25 MG tablet TAKE 1 BY MOUTH DAILY 07/02/14   Rosalita Chessman, DO  atorvastatin (LIPITOR) 10 MG tablet Take 1 tablet (10 mg total) by mouth daily. 05/28/14   Alferd Apa Lowne, DO  betaxolol (BETOPTIC-S) 0.25 % ophthalmic suspension Place 1 drop into both eyes 2 (two) times daily.      Historical Provider, MD  ciprofloxacin (CIPRO) 500 MG tablet Take 1 tablet (500 mg total) by mouth 2 (two) times daily. 07/14/14   Robbie Lis, MD  clopidogrel (PLAVIX) 75 MG tablet Take 1 tablet (75 mg total) by mouth daily with breakfast. 08/07/13   Kendrick Fries  R Lowne, DO  ezetimibe-simvastatin (VYTORIN) 10-20 MG per tablet Take 1 tablet by mouth every evening.    Historical Provider, MD  feeding supplement, ENSURE ENLIVE, (ENSURE ENLIVE) LIQD Take 237 mLs by mouth 2 (two) times daily between meals. 07/14/14   Robbie Lis, MD  fenofibrate (TRICOR) 145 MG tablet TAKE 1 BY MOUTH DAILY 06/25/14   Rosalita Chessman, DO  gabapentin (NEURONTIN) 600 MG tablet Take 1 tablet (600 mg total) by mouth at bedtime. 10/24/13   Rosalita Chessman, DO  HYDROcodone-acetaminophen (NORCO/VICODIN) 5-325 MG per tablet Take 1 tablet by mouth every 6 (six) hours as needed for moderate pain. 12/20/13   Merryl Hacker, MD  levothyroxine (SYNTHROID, LEVOTHROID) 137 MCG tablet Take 1 tablet (137 mcg total) by mouth daily. 07/14/14   Robbie Lis, MD  Multiple Vitamin (MULTIVITAMIN WITH MINERALS) TABS tablet Take 1  tablet by mouth daily.    Historical Provider, MD  nystatin (MYCOSTATIN) 100000 UNIT/ML suspension Take 5 mLs (500,000 Units total) by mouth 4 (four) times daily. 07/19/14   Alferd Apa Lowne, DO  pantoprazole (PROTONIX) 40 MG tablet Take 1 tablet (40 mg total) by mouth daily. 10/24/13   Rosalita Chessman, DO  potassium chloride SA (K-DUR,KLOR-CON) 20 MEQ tablet TAKE 1 BY MOUTH DAILY 03/26/14   Rosalita Chessman, DO  sertraline (ZOLOFT) 50 MG tablet Take 1 tablet (50 mg total) by mouth daily. 10/24/13   Rosalita Chessman, DO  tolterodine (DETROL LA) 4 MG 24 hr capsule Take 1 capsule (4 mg total) by mouth daily. 08/07/13   Rosalita Chessman, DO  valsartan-hydrochlorothiazide (DIOVAN-HCT) 160-25 MG per tablet Take 0.5 tablets by mouth daily. 10/30/13   Rosalita Chessman, DO   Physical Exam: Filed Vitals:   07/22/14 1530 07/22/14 1542 07/22/14 1726 07/22/14 1732  BP: 130/49  137/104 126/39  Pulse: 51  60 61  Temp:  98.1 F (36.7 C) 98.3 F (36.8 C)   TempSrc:  Axillary Oral   Resp: 18  24 25   SpO2: 96%  98%     Wt Readings from Last 3 Encounters:  07/13/14 96.4 kg (212 lb 8.4 oz)  05/01/14 96.072 kg (211 lb 12.8 oz)  01/30/14 98.431 kg (217 lb)    General:  Ill appearing, elderly female Eyes: PERRL, normal lids, irises & conjunctiva ENT: grossly normal hearing, lips & tongue Neck: no LAD, masses or thyromegaly Cardiovascular: RRR, no m/r/g. No LE edema. Telemetry: bradycardic Respiratory: no wheezing, diminished Abdomen: soft, ntnd, +BS Skin: few bruising, feet and hands cold, with bluish tint Musculoskeletal: weak Neurologic: grossly non-focal.          Labs on Admission:  Basic Metabolic Panel:  Recent Labs Lab 07/22/14 1250  NA 144  K 5.4*  CL 103  CO2 35*  GLUCOSE 109*  BUN 45*  CREATININE 1.41*  CALCIUM 10.1   Liver Function Tests: No results for input(s): AST, ALT, ALKPHOS, BILITOT, PROT, ALBUMIN in the last 168 hours. No results for input(s): LIPASE, AMYLASE in the last 168  hours. No results for input(s): AMMONIA in the last 168 hours. CBC:  Recent Labs Lab 07/22/14 1150  WBC 6.8  NEUTROABS 5.3  HGB 11.3*  HCT 37.4  MCV 109.0*  PLT 176   Cardiac Enzymes: No results for input(s): CKTOTAL, CKMB, CKMBINDEX, TROPONINI in the last 168 hours.  BNP (last 3 results)  Recent Labs  07/11/14 1525  BNP 282.5*    ProBNP (last 3 results) No results for  input(s): PROBNP in the last 8760 hours.  CBG: No results for input(s): GLUCAP in the last 168 hours.  Radiological Exams on Admission: Ct Head Wo Contrast  07/22/2014   CLINICAL DATA:  Shortness breath and altered level of consciousness.  EXAM: CT HEAD WITHOUT CONTRAST  TECHNIQUE: Contiguous axial images were obtained from the base of the skull through the vertex without contrast.  COMPARISON:  07/11/2014  FINDINGS: There is diffuse cerebral atrophy which is stable. There is low density throughout the periventricular and subcortical white matter suggesting chronic changes which is also stable. No evidence for acute hemorrhage, mass lesion, midline shift, hydrocephalus or large infarct. Again noted is a large polyp involving the right maxillary sinus. No acute bone abnormality.  IMPRESSION: No acute intracranial abnormality.  Stable atrophy and evidence for chronic small vessel ischemic disease.   Electronically Signed   By: Markus Daft M.D.   On: 07/22/2014 11:42   Dg Chest Port 1 View  07/22/2014   CLINICAL DATA:  Shortness of breath and lethargic. Altered level of consciousness.  EXAM: PORTABLE CHEST - 1 VIEW  COMPARISON:  07/11/2014  FINDINGS: There are chronic lung markings throughout the lungs, particularly in the right upper lobe. There has been minimal change from the previous examination. Evidence for old granulomatous disease demonstrated by scattered calcifications in the chest. Heart size is upper limits of normal but minimally changed. Slightly decreased lung volumes. Atherosclerotic calcifications at  the aortic arch.  IMPRESSION: Low lung volumes with chronic lung changes.  No acute findings.   Electronically Signed   By: Markus Daft M.D.   On: 07/22/2014 11:39    EKG: Independently reviewed. Sinus brady, low voltage  Assessment/Plan Active Problems:   Severe obesity (BMI >= 40)   Hyperkalemia   Hypercapnia   Acute on chronic respiratory failure with hypercapnia   AKI (acute kidney injury)   Acute on chronic resp failure due to hypercapnea  Due to COPD vs O2 malfunction at home -check BNP -check echo -O2- keep sats 88-92% -ABG at 9 or before stat if patient becomes more lethargic and place back on Bipap  Hyperkalemia- hold supplement IVF -recheck in AM  AKI- IVF, hold nephrotoxic agents  Severe obesity  Bradycardia -hold BB   Spoke at length with the patient's son regarding her poor prognosis- will treat but if she does worsen will need hospice/pallaitive care evaluation- he understands but is tearful     Code Status:DNR DVT Prophylaxis: Family Communication: son [name] is MPOA Disposition Plan:   Time spent: 75 min  Eulogio Bear [facility] Pager [phone]

## 2014-07-22 NOTE — ED Notes (Signed)
MD notified that pt's heart rate will go down into the 30s and recover to 40s-50s within 1 minute; pt at times desats to high 80s and will quickly come up to low 90s. Connye Burkitt, RN

## 2014-07-22 NOTE — Progress Notes (Signed)
Patient with hx of chronic resp failure on home oxygen recently discharged from [facility] long hospital with generalized weakness and acute encephalopathy felt to be secondary to Escherichia coli UTI. At that time SNF was recommended however patient and son adamantly refused. Patient presenting back to the ED at [facility] with shortness of breath, cough and hypoxia with lethargy. Patient noted to be in an acute COPD exacerbation with a pH of 7.24, PCO2 of 92, bicarbonate of 40, PO2 of 86. Patient was placed on the BiPAP and is slowly improving per ED physician. Patient is a DNR/DNI. Patient accepted to a stepdown bed at [facility]. Patient will likely require BiPAP and management for COPD exacerbation. Chest x-ray negative for any acute infiltrate. Patient would likely need skilled nursing facility.

## 2014-07-22 NOTE — Progress Notes (Signed)
RT note:  Bipap on standby pending abg results.  Rt will continue tom monitor.

## 2014-07-22 NOTE — Progress Notes (Signed)
Bipap on standby per MD, patient transitioned to [facility], patient awake, alert answering questions appropriately.

## 2014-07-22 NOTE — ED Notes (Signed)
Patient placed on BiPAP at 1120. 16/8 and 30%. Patient seems to be resting comfortably. Son at bedisde. RT to monitor.

## 2014-07-22 NOTE — ED Notes (Signed)
Pt peri care performed, bed linens changed and new pads placed beneath patient.  Pt position adjusted in bed.

## 2014-07-22 NOTE — ED Notes (Signed)
Per [facility], pt lives with son and has home health assisting with care. Son called EMS due to pt oxygen sats in the 56s and that the oxygen tank had been empty for unknown amount of time. EMS states pt was alert and oriented x 4. Pt is too lethargic for me to assess LOC. O2 Sats are currently 97% on 3L . Pt able to verbalize DOB and denies pain. Son arrived and states pt did have oxygen this morning but he thinks his pulse ox at home is faulty.

## 2014-07-22 NOTE — ED Notes (Signed)
RT called report to Kal, RRT at [facility] and he is aware she is on the way.

## 2014-07-22 NOTE — ED Provider Notes (Addendum)
CSN: [id number]     Arrival date & time 07/22/14  1014 History   First MD Initiated Contact with Patient 07/22/14 1035     Chief Complaint  Patient presents with  . Shortness of Breath      HPI  Patient presents for evaluation of difficulty breathing and cough. Also confusion. Son lives with her at home. She was recently admitted at [facility] long with UTI. Discharged 5/16.  From review of inpatient notes it looks like skilled nursing facility was recommended. However, patient and son adamantly wanted to go home. She was verbal at discharge but required assistance. She is been at home with full support including home health care nurse, son, friends, PT and OT evaluations. Despite this she has been minimally active. Son states that she was hallucinating yesterday. Her saturations have dropped several times down into the 70s. She's had a new cough.  Denies fevers. Since states that she has developed bedsores since being at home.  Upon arrival today paramedics describe saturations of 60 on patient's 3 L. Was placed on a mask O2. Right is here with improved saturations but with poor mental status. Confused. Minimally responsive verbally.  Past Medical History  Diagnosis Date  . Hyperlipidemia   . Hypertension   . Osteopenia   . Thyroid disease   . B12 deficiency   . Arthritis   . Memory loss   . Knee pain     RIGHT  . Gait disturbance    Past Surgical History  Procedure Laterality Date  . Appendectomy    . Cholecystectomy    . Abdominal hysterectomy    . Oophorectomy     Family History  Problem Relation Age of Onset  . Colon cancer    . Melanoma    . Cancer Other     COLON...1ST DEGREE RELATIVE   History  Substance Use Topics  . Smoking status: Former Smoker -- 2.00 packs/day for 25 years    Types: Cigarettes    Quit date: 03/03/1971  . Smokeless tobacco: Never Used  . Alcohol Use: Yes     Comment: 1 cocktail in the evening and a glass of wine - not everyday but doesn't  state how often   OB History    No data available     Review of Systems  Unable to perform ROS: Mental status change      Allergies  Diltiazem hcl and Neomycin  Home Medications   Prior to Admission medications   Medication Sig Start Date End Date Taking? Authorizing Provider  MetroNIDAZOLE (FLAGYL PO) Take 5 mLs by mouth.   Yes Historical Provider, MD  acetaminophen (TYLENOL ARTHRITIS PAIN) 650 MG CR tablet Take 1,300 mg by mouth 2 (two) times daily.    Historical Provider, MD  atenolol (TENORMIN) 25 MG tablet TAKE 1 BY MOUTH DAILY 07/02/14   Rosalita Chessman, DO  atorvastatin (LIPITOR) 10 MG tablet Take 1 tablet (10 mg total) by mouth daily. 05/28/14   Alferd Apa Lowne, DO  betaxolol (BETOPTIC-S) 0.25 % ophthalmic suspension Place 1 drop into both eyes 2 (two) times daily.      Historical Provider, MD  ciprofloxacin (CIPRO) 500 MG tablet Take 1 tablet (500 mg total) by mouth 2 (two) times daily. 07/14/14   Robbie Lis, MD  clopidogrel (PLAVIX) 75 MG tablet Take 1 tablet (75 mg total) by mouth daily with breakfast. 08/07/13   Rosalita Chessman, DO  ezetimibe-simvastatin (VYTORIN) 10-20 MG per tablet Take 1 tablet by  mouth every evening.    Historical Provider, MD  feeding supplement, ENSURE ENLIVE, (ENSURE ENLIVE) LIQD Take 237 mLs by mouth 2 (two) times daily between meals. 07/14/14   Robbie Lis, MD  fenofibrate (TRICOR) 145 MG tablet TAKE 1 BY MOUTH DAILY 06/25/14   Rosalita Chessman, DO  gabapentin (NEURONTIN) 600 MG tablet Take 1 tablet (600 mg total) by mouth at bedtime. 10/24/13   Rosalita Chessman, DO  HYDROcodone-acetaminophen (NORCO/VICODIN) 5-325 MG per tablet Take 1 tablet by mouth every 6 (six) hours as needed for moderate pain. 12/20/13   Merryl Hacker, MD  levothyroxine (SYNTHROID, LEVOTHROID) 137 MCG tablet Take 1 tablet (137 mcg total) by mouth daily. 07/14/14   Robbie Lis, MD  Multiple Vitamin (MULTIVITAMIN WITH MINERALS) TABS tablet Take 1 tablet by mouth daily.    Historical  Provider, MD  nystatin (MYCOSTATIN) 100000 UNIT/ML suspension Take 5 mLs (500,000 Units total) by mouth 4 (four) times daily. 07/19/14   Alferd Apa Lowne, DO  pantoprazole (PROTONIX) 40 MG tablet Take 1 tablet (40 mg total) by mouth daily. 10/24/13   Rosalita Chessman, DO  potassium chloride SA (K-DUR,KLOR-CON) 20 MEQ tablet TAKE 1 BY MOUTH DAILY 03/26/14   Rosalita Chessman, DO  sertraline (ZOLOFT) 50 MG tablet Take 1 tablet (50 mg total) by mouth daily. 10/24/13   Rosalita Chessman, DO  tolterodine (DETROL LA) 4 MG 24 hr capsule Take 1 capsule (4 mg total) by mouth daily. 08/07/13   Rosalita Chessman, DO  valsartan-hydrochlorothiazide (DIOVAN-HCT) 160-25 MG per tablet Take 0.5 tablets by mouth daily. 10/30/13   Alferd Apa Lowne, DO   BP 115/43 mmHg  Pulse 52  Temp(Src) 98 F (36.7 C) (Axillary)  Resp 17  SpO2 95% Physical Exam  Constitutional: She appears listless. No distress.  Obese. Lethargic.  HENT:  Head: Normocephalic.  Protects her airway with gag reflex. Has a frequent cough.  Eyes: Conjunctivae are normal. Pupils are equal, round, and reactive to light. No scleral icterus.  Neck: Normal range of motion. Neck supple. No thyromegaly present.  Cardiovascular: Normal rate and regular rhythm.  Exam reveals no gallop and no friction rub.   No murmur heard. Pulmonary/Chest: Effort normal. No respiratory distress. She has decreased breath sounds. She has no wheezes. She has no rales.  Globally diminished breath sounds. Mild prolongation.  Abdominal: Soft. Bowel sounds are normal. She exhibits no distension. There is no tenderness. There is no rebound.  Musculoskeletal: Normal range of motion.  Neurological: She appears listless.  Moves all 4 extremities  Skin: Skin is warm and dry. No rash noted.  Psychiatric: She has a normal mood and affect. Her behavior is normal.    ED Course  Procedures (including critical care time) Labs Review Labs Reviewed  CBC WITH DIFFERENTIAL/PLATELET - Abnormal; Notable  for the following:    RBC 3.43 (*)    Hemoglobin 11.3 (*)    MCV 109.0 (*)    All other components within normal limits  URINALYSIS, ROUTINE W REFLEX MICROSCOPIC - Abnormal; Notable for the following:    Color, Urine AMBER (*)    Leukocytes, UA SMALL (*)    All other components within normal limits  BASIC METABOLIC PANEL - Abnormal; Notable for the following:    Potassium 5.4 (*)    CO2 35 (*)    Glucose, Bld 109 (*)    BUN 45 (*)    Creatinine, Ser 1.41 (*)    GFR calc non Af  Amer 32 (*)    GFR calc Af Amer 37 (*)    All other components within normal limits  I-STAT ARTERIAL BLOOD GAS, ED - Abnormal; Notable for the following:    pH, Arterial 7.241 (*)    pCO2 arterial 92.1 (*)    Bicarbonate 39.7 (*)    Acid-Base Excess 9.0 (*)    All other components within normal limits  I-STAT CG4 LACTIC ACID, ED - Abnormal; Notable for the following:    Lactic Acid, Venous <0.30 (*)    All other components within normal limits  I-STAT ARTERIAL BLOOD GAS, ED - Abnormal; Notable for the following:    pH, Arterial 7.276 (*)    pCO2 arterial 79.5 (*)    pO2, Arterial 66.0 (*)    Bicarbonate 37.2 (*)    Acid-Base Excess 8.0 (*)    All other components within normal limits  CULTURE, BLOOD (ROUTINE X 2)  CULTURE, BLOOD (ROUTINE X 2)  URINE CULTURE  URINE MICROSCOPIC-ADD ON    Imaging Review Ct Head Wo Contrast  07/22/2014   CLINICAL DATA:  Shortness breath and altered level of consciousness.  EXAM: CT HEAD WITHOUT CONTRAST  TECHNIQUE: Contiguous axial images were obtained from the base of the skull through the vertex without contrast.  COMPARISON:  07/11/2014  FINDINGS: There is diffuse cerebral atrophy which is stable. There is low density throughout the periventricular and subcortical white matter suggesting chronic changes which is also stable. No evidence for acute hemorrhage, mass lesion, midline shift, hydrocephalus or large infarct. Again noted is a large polyp involving the right  maxillary sinus. No acute bone abnormality.  IMPRESSION: No acute intracranial abnormality.  Stable atrophy and evidence for chronic small vessel ischemic disease.   Electronically Signed   By: Markus Daft M.D.   On: 07/22/2014 11:42   Dg Chest Port 1 View  07/22/2014   CLINICAL DATA:  Shortness of breath and lethargic. Altered level of consciousness.  EXAM: PORTABLE CHEST - 1 VIEW  COMPARISON:  07/11/2014  FINDINGS: There are chronic lung markings throughout the lungs, particularly in the right upper lobe. There has been minimal change from the previous examination. Evidence for old granulomatous disease demonstrated by scattered calcifications in the chest. Heart size is upper limits of normal but minimally changed. Slightly decreased lung volumes. Atherosclerotic calcifications at the aortic arch.  IMPRESSION: Low lung volumes with chronic lung changes.  No acute findings.   Electronically Signed   By: Markus Daft M.D.   On: 07/22/2014 11:39     EKG Interpretation   Date/Time:  Sunday Jul 22 2014 10:17:27 EDT Ventricular Rate:  59 PR Interval:  182 QRS Duration: 96 QT Interval:  388 QTC Calculation: 384 R Axis:   -4 Text Interpretation:  Sinus bradycardia Low voltage QRS Borderline ECG  Confirmed by Jeneen Rinks  MD, Neihart ([id number]) on 07/22/2014 3:25:19 PM       CRITICAL CARE Performed by: Tanna Furry JOSEPH   Total critical care time: 66minutes Start:  12:20 Stop:  13:50 Initiation of, and management of BiPAP assisted ventilation for hypercapnic respiratory failure.  Critical care time was exclusive of separately billable procedures and treating other patients.  Critical care was necessary to treat or prevent imminent or life-threatening deterioration.  Critical care was time spent personally by me on the following activities: development of treatment plan with patient and/or surrogate as well as nursing, discussions with consultants, evaluation of patient's response to treatment,  examination of patient, obtaining history from  patient or surrogate, ordering and performing treatments and interventions, ordering and review of laboratory studies, ordering and review of radiographic studies, pulse oximetry and re-evaluation of patient's condition. Care  MDM   Final diagnoses:  SOB (shortness of breath)  Altered level of consciousness  Acute on chronic respiratory failure with hypercapnia    ABG obtained shows hypercapnia with acute respiratory failure. Patient is DO NOT RESUSCITATE, DO NOT INTUBATE percent report. Placed on BiPAP. Continued on BiPAP for one hour. Her mental status improves. We have weaned her O2 to maintain a saturation of 89%. CT scan of head and chest x-ray show no acute process. No UTI on cath urine.  Recheck ABG shows improving/decreasing PCO2 to 79. PH improving 7.27. Patient will require admission for continued ventilatory assistance. She will need evaluation with medical social work regarding the possibility of return to her current living facility versus SNF.    She given albuterol by paramedics en route. Given albuterol neb, and IV Solu-Medrol here.      Tanna Furry, MD 07/22/14 1331  Tanna Furry, MD 07/22/14 [address]acey [address], MD 07/22/14 1351  Tanna Furry, MD 07/22/14 [phone]

## 2014-07-23 ENCOUNTER — Inpatient Hospital Stay (HOSPITAL_COMMUNITY): Payer: Medicare Other

## 2014-07-23 ENCOUNTER — Other Ambulatory Visit (HOSPITAL_COMMUNITY): Payer: Medicare Other

## 2014-07-23 DIAGNOSIS — R0689 Other abnormalities of breathing: Secondary | ICD-10-CM

## 2014-07-23 DIAGNOSIS — N179 Acute kidney failure, unspecified: Secondary | ICD-10-CM

## 2014-07-23 DIAGNOSIS — R0600 Dyspnea, unspecified: Secondary | ICD-10-CM

## 2014-07-23 LAB — BASIC METABOLIC PANEL
Anion gap: 6 (ref 5–15)
BUN: 41 mg/dL — AB (ref 6–20)
CHLORIDE: 103 mmol/L (ref 101–111)
CO2: 35 mmol/L — ABNORMAL HIGH (ref 22–32)
Calcium: 9.5 mg/dL (ref 8.9–10.3)
Creatinine, Ser: 1.25 mg/dL — ABNORMAL HIGH (ref 0.44–1.00)
GFR calc Af Amer: 43 mL/min — ABNORMAL LOW (ref >60)
GFR calc non Af Amer: 37 mL/min — ABNORMAL LOW (ref >60)
Glucose, Bld: 133 mg/dL — ABNORMAL HIGH (ref 65–99)
POTASSIUM: 5 mmol/L (ref 3.5–5.1)
Sodium: 144 mmol/L (ref 135–145)

## 2014-07-23 LAB — BLOOD GAS, ARTERIAL
ACID-BASE EXCESS: 7.1 mmol/L — AB (ref 0.0–2.0)
Bicarbonate: 33.3 mEq/L — ABNORMAL HIGH (ref 20.0–24.0)
Drawn by: 275531
O2 Content: 4 L/min
O2 Saturation: 94.6 %
PATIENT TEMPERATURE: 98.6
PCO2 ART: 69.7 mmHg — AB (ref 35.0–45.0)
PH ART: 7.3 — AB (ref 7.350–7.450)
PO2 ART: 81.8 mmHg (ref 80.0–100.0)
TCO2: 35.4 mmol/L (ref 0–100)

## 2014-07-23 LAB — URINE CULTURE
Colony Count: NO GROWTH
Culture: NO GROWTH

## 2014-07-23 LAB — CBC
HCT: 34.8 % — ABNORMAL LOW (ref 36.0–46.0)
HEMOGLOBIN: 10.6 g/dL — AB (ref 12.0–15.0)
MCH: 32.2 pg (ref 26.0–34.0)
MCHC: 30.5 g/dL (ref 30.0–36.0)
MCV: 105.8 fL — ABNORMAL HIGH (ref 78.0–100.0)
Platelets: 158 10*3/uL (ref 150–400)
RBC: 3.29 MIL/uL — ABNORMAL LOW (ref 3.87–5.11)
RDW: 13.4 % (ref 11.5–15.5)
WBC: 3.4 10*3/uL — ABNORMAL LOW (ref 4.0–10.5)

## 2014-07-23 LAB — TROPONIN I
TROPONIN I: 0.55 ng/mL — AB (ref <0.031)
TROPONIN I: 0.62 ng/mL — AB (ref <0.031)

## 2014-07-23 MED ORDER — METHYLPREDNISOLONE SODIUM SUCC 125 MG IJ SOLR
60.0000 mg | Freq: Four times a day (QID) | INTRAMUSCULAR | Status: DC
  Administered 2014-07-23 – 2014-07-24 (×4): 60 mg via INTRAVENOUS
  Filled 2014-07-23 (×2): qty 2
  Filled 2014-07-23: qty 0.96
  Filled 2014-07-23: qty 2
  Filled 2014-07-23 (×3): qty 0.96
  Filled 2014-07-23: qty 2

## 2014-07-23 MED ORDER — ASPIRIN 81 MG PO CHEW
81.0000 mg | CHEWABLE_TABLET | Freq: Every day | ORAL | Status: DC
  Administered 2014-07-23 – 2014-07-24 (×2): 81 mg via ORAL
  Filled 2014-07-23 (×2): qty 1

## 2014-07-23 MED ORDER — LEVOFLOXACIN 500 MG PO TABS
500.0000 mg | ORAL_TABLET | Freq: Every day | ORAL | Status: DC
  Administered 2014-07-23 – 2014-07-26 (×4): 500 mg via ORAL
  Filled 2014-07-23 (×4): qty 1

## 2014-07-23 NOTE — Progress Notes (Addendum)
CRITICAL VALUE ALERT  Critical value received:  PH 7.27, CO2 74.8, O2 135, bicarb 33.4  Date of notification:  07/22/14  Time of notification:  2016  Critical value read back: yes  Nurse who received alert:  Denice Bors  MD notified (1st page):  Ethelene Hal  Time of first page:  [phone]  MD notified (2nd page):  Time of second page:  Responding MD:  Ethelene Hal  Time MD responded:  2022  Patient placed back on biPAP. No new orders received. Will continue to monitor patient closely.

## 2014-07-23 NOTE — Progress Notes (Signed)
CRITICAL VALUE ALERT  Critical value received:  Troponin 0.91  Date of notification:  07/22/14  Time of notification:  1942  Critical value read back: yes  Nurse who received alert:  Denice Bors  MD notified (1st page): Ethelene Hal  Time of first page:  1943  MD notified (2nd page):  Time of second page:  Responding MD:  Ethelene Hal  Time MD responded:  1949  Patient denies any chest pain. No new orders received. Will continue to monitor patient closely.

## 2014-07-23 NOTE — Progress Notes (Signed)
Utilization Review Completed.  

## 2014-07-23 NOTE — Progress Notes (Signed)
07/23/2014 Bladder can was done at 1200, patient had 190 cc in bladder. Dr Broadus John was made aware patient at 1400 patient had not voided. This morning at 0530 third shift nurse did a bladder scan she had 465cc. Order was place for foley cath, because of urinary retention. [facility] RN.

## 2014-07-23 NOTE — Progress Notes (Addendum)
[facility] PROGRESS NOTE  Kirsten Flores KXF:[id number] DOB: Feb 17, 1924 DOA: 07/22/2014 PCP: Garnet Koyanagi, DO  Assessment/Plan: 1. Acute hypercarbic resp failure -due to COPD flare -Improving, now off BiPAP -Continue IV Solu-Medrol, nebs, add Levaquin -Chest x-ray without acute findings  2. Elevated troponin -Suspect secondary to demand, -Continue Plavix, add ASA, check 2-D echocardiogram -Unable to add beta blocker due to advanced COPD, medical management due to advanced age/severe COPD  3. Recent UTI metabolic encephalopathy -Reportedly completed Cipro course  4. Memory loss/dementia -Stable  5. Hypothyroidism -Continue Synthroid  6 mild AKI on CK D3.  -Improved, back at baseline   DVT prophylaxis: Heparin subcutaneous  Code Status: DNR Family Communication: none at bedside (indicate person spoken with, relationship, and if by phone, the number) Disposition Plan: Keep in SDU today      HPI/Subjective: Breathing better, no chest pain  Objective: Filed Vitals:   07/23/14 1039  BP: 117/43  Pulse: 78  Temp:   Resp: 31    Intake/Output Summary (Last 24 hours) at 07/23/14 1150 Last data filed at 07/23/14 0700  Gross per 24 hour  Intake 956.25 ml  Output   2800 ml  Net -1843.75 ml   Filed Weights   07/23/14 0418  Weight: 95.8 kg (211 lb 3.2 oz)    Exam:   General:  Awake, alert, oriented to self and partly to place  Cardiovascular: S!S2/RRR  Respiratory: poor air movement, rare wheezes  Abdomen: soft, Nt, Bs rpesent  Musculoskeletal: no edema c/c  Neuro: moves all extremities   Data Reviewed: Basic Metabolic Panel:  Recent Labs Lab 07/22/14 1250 07/23/14 0625  NA 144 144  K 5.4* 5.0  CL 103 103  CO2 35* 35*  GLUCOSE 109* 133*  BUN 45* 41*  CREATININE 1.41* 1.25*  CALCIUM 10.1 9.5   Liver Function Tests: No results for input(s): AST, ALT, ALKPHOS, BILITOT, PROT, ALBUMIN in the last 168 hours. No results for input(s):  LIPASE, AMYLASE in the last 168 hours. No results for input(s): AMMONIA in the last 168 hours. CBC:  Recent Labs Lab 07/22/14 1150 07/23/14 0625  WBC 6.8 3.4*  NEUTROABS 5.3  --   HGB 11.3* 10.6*  HCT 37.4 34.8*  MCV 109.0* 105.8*  PLT 176 158   Cardiac Enzymes:  Recent Labs Lab 07/22/14 1810 07/23/14 0010 07/23/14 0625  TROPONINI 0.91* 0.62* 0.55*   BNP (last 3 results)  Recent Labs  07/11/14 1525 07/22/14 1810  BNP 282.5* 715.8*    ProBNP (last 3 results) No results for input(s): PROBNP in the last 8760 hours.  CBG: No results for input(s): GLUCAP in the last 168 hours.  Recent Results (from the past 240 hour(s))  Culture, blood (routine x 2)     Status: None (Preliminary result)   Collection Time: 07/22/14 11:50 AM  Result Value Ref Range Status   Specimen Description BLOOD LEFT WRIST  Final   Special Requests BOTTLES DRAWN AEROBIC AND ANAEROBIC 5CC EACH  Final   Culture   Final           BLOOD CULTURE RECEIVED NO GROWTH TO DATE CULTURE WILL BE HELD FOR 5 DAYS BEFORE ISSUING A FINAL NEGATIVE REPORT Performed at [facility]    Report Status PENDING  Incomplete  Culture, blood (routine x 2)     Status: None (Preliminary result)   Collection Time: 07/22/14 12:10 PM  Result Value Ref Range Status   Specimen Description BLOOD RIGHT FOREARM  Final   Special Requests  BOTTLES DRAWN AEROBIC AND ANAEROBIC 5CC EACH  Final   Culture   Final           BLOOD CULTURE RECEIVED NO GROWTH TO DATE CULTURE WILL BE HELD FOR 5 DAYS BEFORE ISSUING A FINAL NEGATIVE REPORT Performed at [facility]    Report Status PENDING  Incomplete  MRSA PCR Screening     Status: None   Collection Time: 07/22/14  5:58 PM  Result Value Ref Range Status   MRSA by PCR NEGATIVE NEGATIVE Final    Comment:        The GeneXpert MRSA Assay (FDA approved for NASAL specimens only), is one component of a comprehensive MRSA colonization surveillance program. It is not intended  to diagnose MRSA infection nor to guide or monitor treatment for MRSA infections.      Studies: Ct Head Wo Contrast  07/22/2014   CLINICAL DATA:  Shortness breath and altered level of consciousness.  EXAM: CT HEAD WITHOUT CONTRAST  TECHNIQUE: Contiguous axial images were obtained from the base of the skull through the vertex without contrast.  COMPARISON:  07/11/2014  FINDINGS: There is diffuse cerebral atrophy which is stable. There is low density throughout the periventricular and subcortical white matter suggesting chronic changes which is also stable. No evidence for acute hemorrhage, mass lesion, midline shift, hydrocephalus or large infarct. Again noted is a large polyp involving the right maxillary sinus. No acute bone abnormality.  IMPRESSION: No acute intracranial abnormality.  Stable atrophy and evidence for chronic small vessel ischemic disease.   Electronically Signed   By: Markus Daft M.D.   On: 07/22/2014 11:42   Dg Chest Port 1 View  07/22/2014   CLINICAL DATA:  Shortness of breath and lethargic. Altered level of consciousness.  EXAM: PORTABLE CHEST - 1 VIEW  COMPARISON:  07/11/2014  FINDINGS: There are chronic lung markings throughout the lungs, particularly in the right upper lobe. There has been minimal change from the previous examination. Evidence for old granulomatous disease demonstrated by scattered calcifications in the chest. Heart size is upper limits of normal but minimally changed. Slightly decreased lung volumes. Atherosclerotic calcifications at the aortic arch.  IMPRESSION: Low lung volumes with chronic lung changes.  No acute findings.   Electronically Signed   By: Markus Daft M.D.   On: 07/22/2014 11:39    Scheduled Meds: . albuterol  2.5 mg Nebulization Q6H  . atorvastatin  10 mg Oral Daily  . betaxolol  1 drop Both Eyes BID  . clopidogrel  75 mg Oral Q breakfast  . feeding supplement (ENSURE ENLIVE)  237 mL Oral BID BM  . heparin  5,000 Units Subcutaneous 3 times  per day  . ipratropium  0.5 mg Nebulization Q6H  . levothyroxine  137 mcg Oral QAC breakfast  . methylPREDNISolone (SOLU-MEDROL) injection  40 mg Intravenous Q6H  . multivitamin with minerals  1 tablet Oral Daily  . nystatin  5 mL Oral QID  . pantoprazole  40 mg Oral Daily  . sertraline  50 mg Oral Daily  . sodium chloride  3 mL Intravenous Q12H   Continuous Infusions:  Antibiotics Given (last 72 hours)    None      Active Problems:   Severe obesity (BMI >= 40)   Hyperkalemia   Hypercapnia   Acute on chronic respiratory failure with hypercapnia   AKI (acute kidney injury)    Time spent: 39min    Kirsten Flores  [facility] Pager [phone]. If 7PM-7AM, please contact  night-coverage at [email or web address], password [facility] 07/23/2014, 11:50 AM  LOS: 1 day

## 2014-07-24 LAB — BASIC METABOLIC PANEL
ANION GAP: 7 (ref 5–15)
BUN: 38 mg/dL — ABNORMAL HIGH (ref 6–20)
CALCIUM: 9.7 mg/dL (ref 8.9–10.3)
CO2: 32 mmol/L (ref 22–32)
CREATININE: 1 mg/dL (ref 0.44–1.00)
Chloride: 103 mmol/L (ref 101–111)
GFR calc Af Amer: 56 mL/min — ABNORMAL LOW (ref >60)
GFR calc non Af Amer: 48 mL/min — ABNORMAL LOW (ref >60)
GLUCOSE: 145 mg/dL — AB (ref 65–99)
Potassium: 4.8 mmol/L (ref 3.5–5.1)
Sodium: 142 mmol/L (ref 135–145)

## 2014-07-24 LAB — CBC
HEMATOCRIT: 32.1 % — AB (ref 36.0–46.0)
HEMOGLOBIN: 9.9 g/dL — AB (ref 12.0–15.0)
MCH: 32.5 pg (ref 26.0–34.0)
MCHC: 30.8 g/dL (ref 30.0–36.0)
MCV: 105.2 fL — AB (ref 78.0–100.0)
Platelets: 173 10*3/uL (ref 150–400)
RBC: 3.05 MIL/uL — ABNORMAL LOW (ref 3.87–5.11)
RDW: 13.7 % (ref 11.5–15.5)
WBC: 3.9 10*3/uL — AB (ref 4.0–10.5)

## 2014-07-24 LAB — GLUCOSE, CAPILLARY: GLUCOSE-CAPILLARY: 144 mg/dL — AB (ref 65–99)

## 2014-07-24 MED ORDER — METHYLPREDNISOLONE SODIUM SUCC 40 MG IJ SOLR
40.0000 mg | Freq: Two times a day (BID) | INTRAMUSCULAR | Status: DC
  Administered 2014-07-24 – 2014-07-26 (×4): 40 mg via INTRAVENOUS
  Filled 2014-07-24 (×6): qty 1

## 2014-07-24 MED ORDER — LEVOTHYROXINE SODIUM 125 MCG PO TABS
125.0000 ug | ORAL_TABLET | Freq: Every day | ORAL | Status: DC
  Administered 2014-07-25 – 2014-07-26 (×2): 125 ug via ORAL
  Filled 2014-07-24 (×3): qty 1

## 2014-07-24 MED ORDER — BISACODYL 5 MG PO TBEC
10.0000 mg | DELAYED_RELEASE_TABLET | Freq: Once | ORAL | Status: AC
  Administered 2014-07-25: 10 mg via ORAL
  Filled 2014-07-24: qty 2

## 2014-07-24 MED ORDER — ALBUTEROL SULFATE (2.5 MG/3ML) 0.083% IN NEBU
2.5000 mg | INHALATION_SOLUTION | Freq: Four times a day (QID) | RESPIRATORY_TRACT | Status: DC | PRN
Start: 2014-07-24 — End: 2014-07-26

## 2014-07-24 MED ORDER — DOCUSATE SODIUM 100 MG PO CAPS
100.0000 mg | ORAL_CAPSULE | Freq: Two times a day (BID) | ORAL | Status: DC
  Administered 2014-07-25 – 2014-07-26 (×4): 100 mg via ORAL
  Filled 2014-07-24 (×6): qty 1

## 2014-07-24 MED ORDER — IPRATROPIUM-ALBUTEROL 0.5-2.5 (3) MG/3ML IN SOLN
3.0000 mL | Freq: Four times a day (QID) | RESPIRATORY_TRACT | Status: DC
  Administered 2014-07-24 – 2014-07-26 (×8): 3 mL via RESPIRATORY_TRACT
  Filled 2014-07-24 (×10): qty 3

## 2014-07-24 NOTE — Clinical Social Work Placement (Signed)
   CLINICAL SOCIAL WORK PLACEMENT  NOTE  Date:  07/24/2014  Patient Details  Name: Kirsten Flores MRN: [id number] Date of Birth: [birth year]  Clinical Social Work is seeking post-discharge placement for this patient at the [place] level of care (*CSW will initial, date and re-position this form in  chart as items are completed):  Yes   Patient/family provided with [place] Work Department's list of facilities offering this level of care within the geographic area requested by the patient (or if unable, by the patient's family).  Yes   Patient/family informed of their freedom to choose among providers that offer the needed level of care, that participate in Medicare, Medicaid or managed care program needed by the patient, have an available bed and are willing to accept the patient.  Yes   Patient/family informed of [place]'s ownership interest in [facility] and [facility], as well as of the fact that they are under no obligation to receive care at these facilities.  PASRR submitted to EDS on 07/24/14     PASRR number received on 07/24/14     Existing PASRR number confirmed on       FL2 transmitted to all facilities in geographic area requested by pt/family on 07/24/14     FL2 transmitted to all facilities within larger geographic area on       Patient informed that his/her managed care company has contracts with or will negotiate with certain facilities, including the following:            Patient/family informed of bed offers received.  Patient chooses bed at       Physician recommends and patient chooses bed at      Patient to be transferred to   on  .  Patient to be transferred to facility by       Patient family notified on   of transfer.  Name of family member notified:        PHYSICIAN       Additional Comment:    _______________________________________________ Cranford Mon, LCSW 07/24/2014, 1:29 PM

## 2014-07-24 NOTE — Progress Notes (Signed)
Pt transferred to room 2W07 via bed from 2C16. Pt oriented to room and call bell and phone. No c/o on arrival. Comfortable at this time.

## 2014-07-24 NOTE — Progress Notes (Signed)
[facility] PROGRESS NOTE  Kirsten Flores [id number]:[id number] DOB: Aug 08, 1923 DOA: 07/22/2014 PCP: Garnet Koyanagi, DO  Assessment/Plan: 1. Acute hypercarbic resp failure -due to COPD flare -Improving, off BiPAP since yetserday -Cut down  IV Solu-Medrol, continue nebs, Levaquin day 2 -Chest x-ray without acute findings  2. Elevated troponin -Suspect secondary to demand, -Continue Plavix, 2-D echocardiogram with normal EF and wall motion, stop ASA -Unable to add beta blocker due to advanced COPD, medical management due to advanced age/severe COPD-son agreeable to this  3. Recent UTI metabolic encephalopathy -Reportedly completed Cipro course  4. Memory loss/dementia -Stable  5. Hypothyroidism -Continue Synthroid  6 mild AKI on CK D3.  -Improved, back at baseline   DVT prophylaxis: Heparin subcutaneous  Code Status: DNR Family Communication: none at bedside, called and d/w son [name] 5/23 (indicate person spoken with, relationship, and if by phone, the number) Disposition Plan: Tx out of SDU, needs rehab at [place], d/w son yesterday      HPI/Subjective: Breathing better, no complaints, more lucid  Objective: Filed Vitals:   07/24/14 1142  BP: 112/44  Pulse: 72  Temp: 98.1 F (36.7 C)  Resp: 18    Intake/Output Summary (Last 24 hours) at 07/24/14 1328 Last data filed at 07/24/14 0929  Gross per 24 hour  Intake      0 ml  Output   1200 ml  Net  -1200 ml   Filed Weights   07/23/14 0418  Weight: 95.8 kg (211 lb 3.2 oz)    Exam:   General:  Awake, alert, oriented to self and place, more lucid  Cardiovascular: S!S2/RRR  Respiratory: poor air movement, no wheezes  Abdomen: soft, Nt, Bs rpesent  Musculoskeletal: no edema c/c  Neuro: moves all extremities   Data Reviewed: Basic Metabolic Panel:  Recent Labs Lab 07/22/14 1250 07/23/14 0625 07/24/14 0318  NA 144 144 142  K 5.4* 5.0 4.8  CL 103 103 103  CO2 35* 35* 32  GLUCOSE 109* 133* 145*  BUN  45* 41* 38*  CREATININE 1.41* 1.25* 1.00  CALCIUM 10.1 9.5 9.7   Liver Function Tests: No results for input(s): AST, ALT, ALKPHOS, BILITOT, PROT, ALBUMIN in the last 168 hours. No results for input(s): LIPASE, AMYLASE in the last 168 hours. No results for input(s): AMMONIA in the last 168 hours. CBC:  Recent Labs Lab 07/22/14 1150 07/23/14 0625 07/24/14 0318  WBC 6.8 3.4* 3.9*  NEUTROABS 5.3  --   --   HGB 11.3* 10.6* 9.9*  HCT 37.4 34.8* 32.1*  MCV 109.0* 105.8* 105.2*  PLT 176 158 173   Cardiac Enzymes:  Recent Labs Lab 07/22/14 1810 07/23/14 0010 07/23/14 0625  TROPONINI 0.91* 0.62* 0.55*   BNP (last 3 results)  Recent Labs  07/11/14 1525 07/22/14 1810  BNP 282.5* 715.8*    ProBNP (last 3 results) No results for input(s): PROBNP in the last 8760 hours.  CBG:  Recent Labs Lab 07/24/14 1123  GLUCAP 144*    Recent Results (from the past 240 hour(s))  Culture, blood (routine x 2)     Status: None (Preliminary result)   Collection Time: 07/22/14 11:50 AM  Result Value Ref Range Status   Specimen Description BLOOD LEFT WRIST  Final   Special Requests BOTTLES DRAWN AEROBIC AND ANAEROBIC 5CC EACH  Final   Culture   Final           BLOOD CULTURE RECEIVED NO GROWTH TO DATE CULTURE WILL BE HELD FOR 5 DAYS BEFORE ISSUING  A FINAL NEGATIVE REPORT Performed at [facility]    Report Status PENDING  Incomplete  Culture, blood (routine x 2)     Status: None (Preliminary result)   Collection Time: 07/22/14 12:10 PM  Result Value Ref Range Status   Specimen Description BLOOD RIGHT FOREARM  Final   Special Requests BOTTLES DRAWN AEROBIC AND ANAEROBIC 5CC EACH  Final   Culture   Final           BLOOD CULTURE RECEIVED NO GROWTH TO DATE CULTURE WILL BE HELD FOR 5 DAYS BEFORE ISSUING A FINAL NEGATIVE REPORT Performed at [facility]    Report Status PENDING  Incomplete  Urine culture     Status: None   Collection Time: 07/22/14 12:47 PM  Result  Value Ref Range Status   Specimen Description URINE, CATHETERIZED  Final   Special Requests NONE  Final   Colony Count NO GROWTH Performed at [facility]   Final   Culture NO GROWTH Performed at [facility]   Final   Report Status 07/23/2014 FINAL  Final  MRSA PCR Screening     Status: None   Collection Time: 07/22/14  5:58 PM  Result Value Ref Range Status   MRSA by PCR NEGATIVE NEGATIVE Final    Comment:        The GeneXpert MRSA Assay (FDA approved for NASAL specimens only), is one component of a comprehensive MRSA colonization surveillance program. It is not intended to diagnose MRSA infection nor to guide or monitor treatment for MRSA infections.      Studies: No results found.  Scheduled Meds: . aspirin  81 mg Oral Daily  . atorvastatin  10 mg Oral Daily  . betaxolol  1 drop Both Eyes BID  . clopidogrel  75 mg Oral Q breakfast  . feeding supplement (ENSURE ENLIVE)  237 mL Oral BID BM  . heparin  5,000 Units Subcutaneous 3 times per day  . ipratropium-albuterol  3 mL Nebulization QID  . levofloxacin  500 mg Oral Daily  . [START ON 07/25/2014] levothyroxine  125 mcg Oral QAC breakfast  . methylPREDNISolone (SOLU-MEDROL) injection  40 mg Intravenous Q12H  . multivitamin with minerals  1 tablet Oral Daily  . nystatin  5 mL Oral QID  . pantoprazole  40 mg Oral Daily  . sertraline  50 mg Oral Daily  . sodium chloride  3 mL Intravenous Q12H   Continuous Infusions:  Antibiotics Given (last 72 hours)    Date/Time Action Medication Dose   07/23/14 1402 Given   levofloxacin (LEVAQUIN) tablet 500 mg 500 mg   07/24/14 1026 Given   levofloxacin (LEVAQUIN) tablet 500 mg 500 mg      Active Problems:   Severe obesity (BMI >= 40)   Hyperkalemia   Hypercapnia   Acute on chronic respiratory failure with hypercapnia   AKI (acute kidney injury)    Time spent: 65min    Annalynne Ibanez  [facility] Pager [phone]. If 7PM-7AM, please contact  night-coverage at [email or web address], password [facility] 07/24/2014, 1:28 PM  LOS: 2 days

## 2014-07-24 NOTE — Clinical Social Work Note (Signed)
Clinical Social Work Assessment  Patient Details  Name: Kirsten Flores MRN: [id number] Date of Birth: January 15, 1924  Date of referral:  07/24/14               Reason for consult:  Facility Placement                Permission sought to share information with:  Family Supports, Chartered certified accountant granted to share information::  Yes, Verbal Permission Granted  Name::     [name]  Agency::  [facility]  Relationship::  son  Contact Information:     Housing/Transportation Living arrangements for the past 2 months:  Single Family Home Source of Information:  Patient Patient Interpreter Needed:  None Criminal Activity/Legal Involvement Pertinent to Current Situation/Hospitalization:  No - Comment as needed Significant Relationships:  Adult Children Lives with:  Adult Children Do you feel safe going back to the place where you live?  Yes Need for family participation in patient care:  Yes (Comment)  Care giving concerns:  Patient lives at home with her adult son but does not feel that he is able to care for her given her current level of impairment   Facilities manager / plan:  CSW spoke with pt concerning PT recommendation for SNF placement for short term rehab  Employment status:  Retired Nurse, adult PT Recommendations:  [place] / Referral to community resources:  [place]  Patient/Family's Response to care:  Patient accepts the need for SNF but is not enthusiastic about the prospect of being in a facility for several weeks.   Patient/Family's Understanding of and Emotional Response to Diagnosis, Current Treatment, and Prognosis:  Patient is very realistic about her need for continued care after the hospital and had no questions about her medical care at this time.  Emotional Assessment Appearance:  Appears stated age Attitude/Demeanor/Rapport:    Affect (typically observed):   Calm, Accepting Orientation:  Oriented to Self, Oriented to Place, Oriented to  Time, Oriented to Situation Alcohol / Substance use:  Not Applicable Psych involvement (Current and /or in the community):  No (Comment)  Discharge Needs  Concerns to be addressed:  Discharge Planning Concerns Readmission within the last 30 days:  Yes Current discharge risk:  Physical Impairment Barriers to Discharge:  Continued Medical Work up   Cranford Mon, LCSW 07/24/2014, 12:53 PM

## 2014-07-24 NOTE — Evaluation (Signed)
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 Patient was recently in [facility] with UTI. SNF was recommended but patient and family wanted to go home. She has not done well at home since discharge. She lives with her son. Yesterday her sats were in the 60s-80s per the son who has a pulse ox at home. Son also states that patient has been hallucinating. Son was worried he had cross threaded the patient's O2. Son admits he has not been able to handle the patient at home  Clinical Impression  Pt admitted with above diagnosis. Pt currently with functional limitations due to the deficits listed below (see PT Problem List). Pt very difficult to move to EOB and to stand at EOB.  Needed +2 assist.  Pt has had decline and son is having difficulty caring for pt at home per note.  Pt needs NHP.   Pt will benefit from skilled PT to increase their independence and safety with mobility to allow discharge to the venue listed below.      Follow Up Recommendations SNF;Supervision/Assistance - 24 hour    Equipment Recommendations  None recommended by PT    Recommendations for Other Services       Precautions / Restrictions Precautions Precautions: Fall Precaution Comments: chronic 2L O2 per chart but pt states she had been 4LO2 Restrictions Weight Bearing Restrictions: No      Mobility  Bed Mobility Overal bed mobility: Needs Assistance;+2 for physical assistance Bed Mobility: Supine to Sit;Sit to Supine     Supine to sit: Mod assist Sit to supine: Mod assist;+2 for physical assistance   General bed mobility comments: HOB raised; +2 to guide trunk when lying down and assist with LEs  Transfers Overall transfer level: Needs assistance Equipment used: Rolling walker (2 wheeled) Transfers: Sit  to/from Stand Sit to Stand: Max assist         General transfer comment: max a to power up and mod to max  A to maintain static standing for 10 seconds.  Pt shaky and trunk with severe posterior lean.  Pt could not weight shift or take steps for pivot transfer.  Posterior lean so significant had to assist pt back to bed.    Ambulation/Gait             General Gait Details: Pt unable at this time  Stairs            Wheelchair Mobility    Modified Rankin (Stroke Patients Only)       Balance Overall balance assessment: Needs assistance;History of Falls Sitting-balance support: Bilateral upper extremity supported;Feet supported Sitting balance-Leahy Scale: Poor Sitting balance - Comments: Pt with posterior lean needing mod assist to sit EOB.   Postural control: Posterior lean Standing balance support: Bilateral upper extremity supported;During functional activity Standing balance-Leahy Scale: Poor Standing balance comment: pt requires UE support.  max asssit to steady statically of 2 persons.                               Pertinent Vitals/Pain Pain Assessment: No/denies pain  78-82 bpm, 93-94% on 4LO2 initially, 88-90% on 4LO2 after activity.  Talked with nursing and incr to 5LO2 with sats 90-91%.  Nursing notified MD regarding O2 sats.  Home Living Family/patient expects to be discharged to:: Private residence Living Arrangements: Children (son) Available Help at Discharge: Family (son works ) Type of Home: House Home Access: [place]: One [place]: Environmental consultant - 2 wheels;Tub bench;Bedside commode Additional Comments: no family to veritfy    Prior Function Level of Independence: Needs assistance   Gait / Transfers Assistance Needed: ambulatory with RW, son reports he usually helps her when he is home and she waits until he returns to mobilize           Hand Dominance   Dominant Hand: Right     Extremity/Trunk Assessment   Upper Extremity Assessment: Defer to OT evaluation           Lower Extremity Assessment: Generalized weakness      Cervical / Trunk Assessment: Kyphotic  Communication   Communication: HOH  Cognition Arousal/Alertness: Awake/alert Behavior During Therapy: WFL for tasks assessed/performed Overall Cognitive Status: No family/caregiver present to determine baseline cognitive functioning                 General Comments: decreased initiation    General Comments      Exercises        Assessment/Plan    PT Assessment Patient needs continued PT services  PT Diagnosis Difficulty walking;Generalized weakness   PT Problem List Decreased strength;Decreased activity tolerance;Decreased mobility;Decreased balance  PT Treatment Interventions DME instruction;Balance training;Gait training;Functional mobility training;Therapeutic activities;Patient/family education;Therapeutic exercise   PT Goals (Current goals can be found in the Care Plan section) Acute Rehab PT Goals Patient Stated Goal: To get better PT Goal Formulation: With patient Time For Goal Achievement: 08/07/14 Potential to Achieve Goals: Good    Frequency Min 2X/week   Barriers to discharge Decreased caregiver support son struggling to assist pt    Co-evaluation PT/OT/SLP Co-Evaluation/Treatment: Yes Reason for Co-Treatment: Complexity of the patient's impairments (multi-system involvement);For patient/therapist safety PT goals addressed during session: Mobility/safety with mobility         End of Session Equipment Utilized During Treatment: Gait belt;Oxygen Activity Tolerance: Patient limited by fatigue (coughing alot with meal) Patient left: in bed;with call bell/phone within reach;with bed alarm set Nurse Communication: Mobility status;Need for lift equipment (pt desat with need to incr O2 to 5L, ST swallow eval rec as pt coughing with food- nurse called MD)          Time: 2423-5361 PT Time Calculation (min) (ACUTE ONLY): 35 min   Charges:   PT Evaluation $Initial PT Evaluation Tier I: 1 Procedure     PT G CodesDenice Paradise 27-Jul-2014, 10:47 AM [facility] Acute Rehabilitation [phone] (pager)

## 2014-07-24 NOTE — Evaluation (Signed)
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 Patient was recently in [facility] with UTI. SNF was recommended but patient and family wanted to go home. She has not done well at home since discharge. She lives with her son. Yesterday her sats were in the 60s-80s per the son who has a pulse ox at home. Son also states that patient has been hallucinating. Son was worried he had cross threaded the patient's O2. Son admits he has not been able to handle the patient at home   Clinical Impression   Pt is a questionable historian. Reports she ambulated with a walker and could perform self care PTA.  Presents with generalized weakness, impaired balance, tremor and decreased awareness of deficits interfering with ability to perform ADL and ADL transfers..  Pt currently requiring +2 assist for bed mobility and to stand.  She was not able to transfer to the chair.  Pt noted to cough on solid food, RN notified. Pt placed on 4L 02 during session as she states this is what she uses at home with sats 88-90% with activity. Remained at 90-91% on 5L, nurse aware. Pt will need SNF upon discharge. Will follow acutely    Follow Up Recommendations  SNF;Supervision/Assistance - 24 hour    Equipment Recommendations    Speech Therapy evaluation for swallowing   Recommendations for Other Services       Precautions / Restrictions Precautions Precautions: Fall Precaution Comments: chronic 2L O2 per chart but pt states she had been 4LO2 Restrictions Weight Bearing Restrictions: No      Mobility Bed Mobility Overal bed mobility: Needs Assistance;+2 for physical assistance Bed Mobility: Supine to Sit;Sit to Supine     Supine to sit: Mod assist Sit to supine: Mod assist;+2 for physical assistance   General  bed mobility comments: HOB raised; +2 to guide trunk when lying down and assist with LEs  Transfers Overall transfer level: Needs assistance Equipment used: Rolling walker (2 wheeled) Transfers: Sit to/from Stand Sit to Stand: Max assist         General transfer comment: max a to power up and mod to max  A to maintain static standing for 10 seconds.  Pt shaky and trunk with severe posterior lean.  Pt could not weight shift or take steps for pivot transfer.  Posterior lean so significant had to assist pt back to bed.      Balance Overall balance assessment: Needs assistance;History of Falls Sitting-balance support: Bilateral upper extremity supported;Feet supported Sitting balance-Leahy Scale: Poor Sitting balance - Comments: Pt with posterior lean needing mod assist to sit EOB.   Postural control: Posterior lean Standing balance support: Bilateral upper extremity supported;During functional activity Standing balance-Leahy Scale: Poor Standing balance comment: pt requires UE support.  max asssit to steady statically of 2 persons.                              ADL Overall ADL's : Needs assistance/impaired Eating/Feeding: Set up;Sitting Eating/Feeding Details (indicate cue type and reason): pt requiring cup with straw and lid to prevent spillage  Grooming: Minimal assistance;Sitting   Upper Body Bathing: Moderate assistance;Sitting   Lower Body Bathing: Total assistance;+2 for physical assistance;Sit to/from stand   Upper Body Dressing : Moderate assistance;Sitting  Lower Body Dressing: Total assistance;Bed level (socks)                       Vision     Perception     Praxis      Pertinent Vitals/Pain Pain Assessment: No/denies pain     Hand Dominance Right   Extremity/Trunk Assessment Upper Extremity Assessment Upper Extremity Assessment: Generalized weakness (moderate tremor)   Lower Extremity Assessment Lower Extremity Assessment: Defer to  PT evaluation   Cervical / Trunk Assessment Cervical / Trunk Assessment: Kyphotic   Communication Communication Communication: HOH   Cognition Arousal/Alertness: Awake/alert Behavior During Therapy: WFL for tasks assessed/performed Overall Cognitive Status: No family/caregiver present to determine baseline cognitive functioning                 General Comments: decreased initiation   General Comments       Exercises       Shoulder Instructions      Home Living Family/patient expects to be discharged to:: Private residence Living Arrangements: Children (son) Available Help at Discharge: Family (son works ) Type of Home: House Home Access: Ramped entrance     Home Layout: One level     Bathroom Shower/Tub: Teacher, early years/pre: Standard     Home Equipment: Environmental consultant - 2 wheels;Tub bench;Bedside commode   Additional Comments: no family to veritfy      Prior Functioning/Environment Level of Independence: Needs assistance  Gait / Transfers Assistance Needed: ambulates in home with RW          OT Diagnosis: Generalized weakness;Cognitive deficits   OT Problem List: Decreased strength;Decreased activity tolerance;Impaired balance (sitting and/or standing);Decreased cognition;Decreased knowledge of use of DME or AE;Obesity;Impaired UE functional use;Cardiopulmonary status limiting activity;Decreased coordination   OT Treatment/Interventions: Self-care/ADL training;DME and/or AE instruction;Balance training;Patient/family education;Cognitive remediation/compensation    OT Goals(Current goals can be found in the care plan section) Acute Rehab OT Goals Patient Stated Goal: To get better Time For Goal Achievement: 08/07/14 Potential to Achieve Goals: Fair ADL Goals Pt Will Perform Grooming: sitting;with supervision Pt Will Perform Upper Body Bathing: with min assist;sitting Pt Will Perform Upper Body Dressing: with supervision;sitting Pt Will  Transfer to Toilet: with mod assist;bedside commode;stand pivot transfer Additional ADL Goal #1: Pt will sit EOB unsupported x 10 minutes with supervision. Additional ADL Goal #2: Pt will perform bed mobility with min assist as a precursor to ADL.  OT Frequency: Min 2X/week   Barriers to D/C: Decreased caregiver support          Co-evaluation PT/OT/SLP Co-Evaluation/Treatment: Yes Reason for Co-Treatment: Complexity of the patient's impairments (multi-system involvement);For patient/therapist safety PT goals addressed during session: Mobility/safety with mobility OT goals addressed during session: ADL's and self-care      End of Session Equipment Utilized During Treatment: Gait belt;Rolling walker  Activity Tolerance: Patient tolerated treatment well Patient left: in bed;with call bell/phone within reach;with bed alarm set   Time: 7482-7078 OT Time Calculation (min): 36 min Charges:  OT General Charges $OT Visit: 1 Procedure OT Evaluation $Initial OT Evaluation Tier I: 1 Procedure G-Codes:    Malka So 07/24/2014, 11:57 AM [phone]

## 2014-07-25 DIAGNOSIS — J9622 Acute and chronic respiratory failure with hypercapnia: Secondary | ICD-10-CM

## 2014-07-25 LAB — BASIC METABOLIC PANEL
Anion gap: 6 (ref 5–15)
BUN: 34 mg/dL — AB (ref 6–20)
CALCIUM: 9.4 mg/dL (ref 8.9–10.3)
CHLORIDE: 104 mmol/L (ref 101–111)
CO2: 33 mmol/L — AB (ref 22–32)
CREATININE: 0.86 mg/dL (ref 0.44–1.00)
GFR calc non Af Amer: 58 mL/min — ABNORMAL LOW (ref >60)
Glucose, Bld: 91 mg/dL (ref 65–99)
POTASSIUM: 4.4 mmol/L (ref 3.5–5.1)
Sodium: 143 mmol/L (ref 135–145)

## 2014-07-25 LAB — CBC
HEMATOCRIT: 32.2 % — AB (ref 36.0–46.0)
HEMOGLOBIN: 9.9 g/dL — AB (ref 12.0–15.0)
MCH: 32.2 pg (ref 26.0–34.0)
MCHC: 30.7 g/dL (ref 30.0–36.0)
MCV: 104.9 fL — ABNORMAL HIGH (ref 78.0–100.0)
PLATELETS: 164 10*3/uL (ref 150–400)
RBC: 3.07 MIL/uL — ABNORMAL LOW (ref 3.87–5.11)
RDW: 13.8 % (ref 11.5–15.5)
WBC: 3.4 10*3/uL — ABNORMAL LOW (ref 4.0–10.5)

## 2014-07-25 NOTE — Progress Notes (Signed)
Medicare Important Message given? YES  (If response is "NO", the following Medicare IM given date fields will be blank)  Date Medicare IM given: 07/25/14 Medicare IM given by:  Lauris Keepers  

## 2014-07-25 NOTE — Progress Notes (Addendum)
MD Wendee Beavers paged second time and he ordered a tap water enema for patient complaints of constipation. Enema not given due to patient having a large bowel movement when patient was turned to her side. Patient stated she felt a lot better and relieved.   Alexia Freestone, RN

## 2014-07-25 NOTE — Progress Notes (Addendum)
[facility] PROGRESS NOTE  Kirsten Flores:[id number] DOB: 22-Jan-1924 DOA: 07/22/2014 PCP: Kirsten Koyanagi, DO  Assessment/Plan: 1. Acute hypercarbic resp failure -due to COPD flare -Improving, off BiPAP addendum -Continue  IV Solu-Medrol, continue nebs, Levaquin day 3  2. Elevated troponin -Suspect secondary to demand, -Continue Plavix, 2-D echocardiogram with normal EF and wall motion, agree with stopping ASA -Unable to add beta blocker due to advanced COPD, medical management due to advanced age/severe COPD-son agreeable to this  3. Recent UTI metabolic encephalopathy -Reportedly completed Cipro course  4. Memory loss/dementia -Stable  5. Hypothyroidism -Continue Synthroid  6 mild AKI on CK D3.  -Improved, back at baseline   DVT prophylaxis: Heparin subcutaneous  Code Status: DNR Family Communication: none at bedside Disposition Plan: with improvement in condition   HPI/Subjective: Reports improvement in breathing condition today.  Objective: Filed Vitals:   07/25/14 1414  BP: 134/50  Pulse: 75  Temp: 98.5 F (36.9 C)  Resp: 18    Intake/Output Summary (Last 24 hours) at 07/25/14 1756 Last data filed at 07/25/14 1448  Gross per 24 hour  Intake    240 ml  Output   1400 ml  Net  -1160 ml   Filed Weights   07/23/14 0418  Weight: 95.8 kg (211 lb 3.2 oz)    Exam:   General:  Awake, alert, oriented to self and place, more lucid  Cardiovascular: S1S2/RRR, no rubs  Respiratory: poor air movement, no wheezes  Abdomen: soft, Nt, no guarding  Musculoskeletal: no edema c/c  Neuro: moves all extremities equally  Data Reviewed: Basic Metabolic Panel:  Recent Labs Lab 07/22/14 1250 07/23/14 0625 07/24/14 0318 07/25/14 0508  NA 144 144 142 143  K 5.4* 5.0 4.8 4.4  CL 103 103 103 104  CO2 35* 35* 32 33*  GLUCOSE 109* 133* 145* 91  BUN 45* 41* 38* 34*  CREATININE 1.41* 1.25* 1.00 0.86  CALCIUM 10.1 9.5 9.7 9.4   Liver Function  Tests: No results for input(s): AST, ALT, ALKPHOS, BILITOT, PROT, ALBUMIN in the last 168 hours. No results for input(s): LIPASE, AMYLASE in the last 168 hours. No results for input(s): AMMONIA in the last 168 hours. CBC:  Recent Labs Lab 07/22/14 1150 07/23/14 0625 07/24/14 0318 07/25/14 0508  WBC 6.8 3.4* 3.9* 3.4*  NEUTROABS 5.3  --   --   --   HGB 11.3* 10.6* 9.9* 9.9*  HCT 37.4 34.8* 32.1* 32.2*  MCV 109.0* 105.8* 105.2* 104.9*  PLT 176 158 173 164   Cardiac Enzymes:  Recent Labs Lab 07/22/14 1810 07/23/14 0010 07/23/14 0625  TROPONINI 0.91* 0.62* 0.55*   BNP (last 3 results)  Recent Labs  07/11/14 1525 07/22/14 1810  BNP 282.5* 715.8*    ProBNP (last 3 results) No results for input(s): PROBNP in the last 8760 hours.  CBG:  Recent Labs Lab 07/24/14 1123  GLUCAP 144*    Recent Results (from the past 240 hour(s))  Culture, blood (routine x 2)     Status: None (Preliminary result)   Collection Time: 07/22/14 11:50 AM  Result Value Ref Range Status   Specimen Description BLOOD LEFT WRIST  Final   Special Requests BOTTLES DRAWN AEROBIC AND ANAEROBIC 5CC EACH  Final   Culture   Final           BLOOD CULTURE RECEIVED NO GROWTH TO DATE CULTURE WILL BE HELD FOR 5 DAYS BEFORE ISSUING A FINAL NEGATIVE REPORT Performed at [facility]  Report Status PENDING  Incomplete  Culture, blood (routine x 2)     Status: None (Preliminary result)   Collection Time: 07/22/14 12:10 PM  Result Value Ref Range Status   Specimen Description BLOOD RIGHT FOREARM  Final   Special Requests BOTTLES DRAWN AEROBIC AND ANAEROBIC 5CC EACH  Final   Culture   Final           BLOOD CULTURE RECEIVED NO GROWTH TO DATE CULTURE WILL BE HELD FOR 5 DAYS BEFORE ISSUING A FINAL NEGATIVE REPORT Performed at [facility]    Report Status PENDING  Incomplete  Urine culture     Status: None   Collection Time: 07/22/14 12:47 PM  Result Value Ref Range Status   Specimen  Description URINE, CATHETERIZED  Final   Special Requests NONE  Final   Colony Count NO GROWTH Performed at [facility]   Final   Culture NO GROWTH Performed at [facility]   Final   Report Status 07/23/2014 FINAL  Final  MRSA PCR Screening     Status: None   Collection Time: 07/22/14  5:58 PM  Result Value Ref Range Status   MRSA by PCR NEGATIVE NEGATIVE Final    Comment:        The GeneXpert MRSA Assay (FDA approved for NASAL specimens only), is one component of a comprehensive MRSA colonization surveillance program. It is not intended to diagnose MRSA infection nor to guide or monitor treatment for MRSA infections.      Studies: No results found.  Scheduled Meds: . atorvastatin  10 mg Oral Daily  . betaxolol  1 drop Both Eyes BID  . clopidogrel  75 mg Oral Q breakfast  . docusate sodium  100 mg Oral BID  . feeding supplement (ENSURE ENLIVE)  237 mL Oral BID BM  . heparin  5,000 Units Subcutaneous 3 times per day  . ipratropium-albuterol  3 mL Nebulization QID  . levofloxacin  500 mg Oral Daily  . levothyroxine  125 mcg Oral QAC breakfast  . methylPREDNISolone (SOLU-MEDROL) injection  40 mg Intravenous Q12H  . multivitamin with minerals  1 tablet Oral Daily  . nystatin  5 mL Oral QID  . pantoprazole  40 mg Oral Daily  . sertraline  50 mg Oral Daily  . sodium chloride  3 mL Intravenous Q12H   Continuous Infusions:  Antibiotics Given (last 72 hours)    Date/Time Action Medication Dose   07/23/14 1402 Given   levofloxacin (LEVAQUIN) tablet 500 mg 500 mg   07/24/14 1026 Given   levofloxacin (LEVAQUIN) tablet 500 mg 500 mg   07/25/14 1045 Given   levofloxacin (LEVAQUIN) tablet 500 mg 500 mg      Active Problems:   Severe obesity (BMI >= 40)   Hyperkalemia   Hypercapnia   Acute on chronic respiratory failure with hypercapnia   AKI (acute kidney injury)    Time spent: 71min    Kirsten Flores, [facility] [place] [facility] Pager [phone]. If  7PM-7AM, please contact night-coverage at [email or web address], password [facility] 07/25/2014, 5:56 PM  LOS: 3 days

## 2014-07-25 NOTE — Evaluation (Signed)
Clinical/Bedside Swallow Evaluation Patient Details  Name: Kirsten Flores MRN: [id number] Date of Birth: January 25, 1924  Today's Date: 07/25/2014 Time: SLP Start Time (ACUTE ONLY): 1508 SLP Stop Time (ACUTE ONLY): 1527 SLP Time Calculation (min) (ACUTE ONLY): 19 min  Past Medical History:  Past Medical History  Diagnosis Date  . Hyperlipidemia   . Hypertension   . Osteopenia   . Thyroid disease   . B12 deficiency   . Arthritis   . Memory loss   . Knee pain     RIGHT  . Gait disturbance    Past Surgical History:  Past Surgical History  Procedure Laterality Date  . Appendectomy    . Cholecystectomy    . Abdominal hysterectomy    . Oophorectomy     HPI:  Pt with h/o mixed COPD (on home O2- 2-4L), hypothyroid, HTN, and memory loss. Patient was recently in [facility] with UTI. SNF was recommended but patient and family wanted to go home. She has not done well at home since discharge. She lives with her son. Yesterday her sats were in the 60s-80s per the son who has a pulse ox at home. Son also states that patient has been hallucinating. Son was worried he had cross threaded the patient's O2. Son admits he has not been able to handle the patient at home   Assessment / Plan / Recommendation Clinical Impression  Pt does not show overt s/s of aspiration across PO trials this afternoon. Per OT note, pt was observed coughing on solid foods. Pt describes difficulty with foods that have small, dry pieces. Recommend Dys 3 diet and thin liquids. No further acute SLP f/u indicated.    Aspiration Risk  Mild    Diet Recommendation Dysphagia 3 (Mech soft);Thin   Medication Administration: Whole meds with puree Compensations: Slow rate;Small sips/bites    Other  Recommendations Oral Care Recommendations: Oral care BID   Pertinent Vitals/Pain n/a    SLP Swallow Goals     Swallow Study Prior Functional Status       General Other Pertinent Information: Pt with h/o  mixed COPD (on home O2- 2-4L), hypothyroid, HTN, and memory loss. Patient was recently in [facility] with UTI. SNF was recommended but patient and family wanted to go home. She has not done well at home since discharge. She lives with her son. Yesterday her sats were in the 60s-80s per the son who has a pulse ox at home. Son also states that patient has been hallucinating. Son was worried he had cross threaded the patient's O2. Son admits he has not been able to handle the patient at home Type of Study: Bedside swallow evaluation Previous Swallow Assessment: none in chart Diet Prior to this Study: Regular;Thin liquids Temperature Spikes Noted: No Respiratory Status: Supplemental O2 delivered via (comment) ([place]) History of Recent Intubation: No Behavior/Cognition: Alert;Cooperative;Pleasant mood Oral Cavity - Dentition: Adequate natural dentition/normal for age Self-Feeding Abilities: Able to feed self Patient Positioning: Upright in bed Baseline Vocal Quality: Normal    Oral/Motor/Sensory Function Overall Oral Motor/Sensory Function: Appears within functional limits for tasks assessed   Ice Chips Ice chips: Not tested   Thin Liquid Thin Liquid: Within functional limits Presentation: Self Fed;Straw    Nectar Thick Nectar Thick Liquid: Not tested   Honey Thick Honey Thick Liquid: Not tested   Puree Puree: Not tested (pt politely declined)   Solid    Solid: Within functional limits Presentation: Self Alm Bustard  Paiewonsky, M.A. CCC-SLP [phone]  Germain Osgood 07/25/2014,3:34 PM

## 2014-07-25 NOTE — Progress Notes (Signed)
Patient feels constipated and states her butt hurts from constipation; paged MD for orders.  Rowe Pavy, RN

## 2014-07-26 DIAGNOSIS — L8990 Pressure ulcer of unspecified site, unspecified stage: Secondary | ICD-10-CM | POA: Insufficient documentation

## 2014-07-26 MED ORDER — LEVOFLOXACIN 500 MG PO TABS: 500.0000 mg | ORAL_TABLET | Freq: Every day | ORAL | Status: DC

## 2014-07-26 MED ORDER — PREDNISONE 50 MG PO TABS: ORAL_TABLET | ORAL | Status: DC

## 2014-07-26 NOTE — Progress Notes (Addendum)
CSW has initiated [employer] approval- auth pending.  Patient chooses [facility] SNF- facility can accept patient today.  CSW paged Dr to inform of bed availability.  CSW will continue to follow.  Domenica Reamer, [place] Social Worker [phone]

## 2014-07-26 NOTE — Progress Notes (Signed)
NURSING PROGRESS NOTE  NOTNAMED CROUCHER [id number] Discharge Data: 07/26/2014 4:32 PM Attending Provider: No att. providers found WSF:[id number] Etter Sjogren, DO     Diamantina Monks to be D/C'd  [place] facility per MD order.  Discussed with the patient the After Visit Summary and all questions fully answered. All IV's discontinued with no bleeding noted. All belongings returned to patient for patient to take home. Patient's foley was discontinued per MD. Patient son was informed by both SW and RN that the patient (his mother) would be leaving today. RN informed him it would be within an hour (between 3:30-4:30) depending transportation.   Report was called to Lawyer at [facility] prior to patient arriving to the facility.   Last Vital Signs:  Blood pressure 121/73, pulse 70, temperature 98.7 F (37.1 C), temperature source Oral, resp. rate 15, weight 95.8 kg (211 lb 3.2 oz), SpO2 91 %.  Discharge Medication List   Medication List    STOP taking these medications        atenolol 25 MG tablet  Commonly known as:  TENORMIN     ciprofloxacin 500 MG tablet  Commonly known as:  CIPRO     HYDROcodone-acetaminophen 5-325 MG per tablet  Commonly known as:  NORCO/VICODIN     potassium chloride SA 20 MEQ tablet  Commonly known as:  K-DUR,KLOR-CON     tolterodine 4 MG 24 hr capsule  Commonly known as:  DETROL LA     valsartan-hydrochlorothiazide 160-25 MG per tablet  Commonly known as:  DIOVAN-HCT      TAKE these medications        atorvastatin 10 MG tablet  Commonly known as:  LIPITOR  Take 1 tablet (10 mg total) by mouth daily.     betaxolol 0.25 % ophthalmic suspension  Commonly known as:  BETOPTIC-S  Place 1 drop into both eyes 2 (two) times daily.     clopidogrel 75 MG tablet  Commonly known as:  PLAVIX  Take 1 tablet (75 mg total) by mouth daily with breakfast.     feeding supplement (ENSURE ENLIVE) Liqd  Take 237 mLs by mouth 2 (two) times daily between meals.      fenofibrate 145 MG tablet  Commonly known as:  TRICOR  TAKE 1 BY MOUTH DAILY     gabapentin 600 MG tablet  Commonly known as:  NEURONTIN  Take 1 tablet (600 mg total) by mouth at bedtime.     levofloxacin 500 MG tablet  Commonly known as:  LEVAQUIN  Take 1 tablet (500 mg total) by mouth daily.  Start taking on:  07/27/2014     levothyroxine 137 MCG tablet  Commonly known as:  SYNTHROID, LEVOTHROID  Take 1 tablet (137 mcg total) by mouth daily.     multivitamin with minerals Tabs tablet  Take 1 tablet by mouth daily.     nystatin 100000 UNIT/ML suspension  Commonly known as:  MYCOSTATIN  Take 5 mLs (500,000 Units total) by mouth 4 (four) times daily.     pantoprazole 40 MG tablet  Commonly known as:  PROTONIX  Take 1 tablet (40 mg total) by mouth daily.     predniSONE 50 MG tablet  Commonly known as:  DELTASONE  Take one tablet by mouth daily  Start taking on:  07/27/2014     sertraline 50 MG tablet  Commonly known as:  ZOLOFT  Take 1 tablet (50 mg total) by mouth daily.     TYLENOL ARTHRITIS  PAIN 650 MG CR tablet  Generic drug:  acetaminophen  Take 1,300 mg by mouth at bedtime as needed for pain.     VITAMIN D PO  Take 1 tablet by mouth daily.

## 2014-07-26 NOTE — Progress Notes (Signed)
Patient will discharge to [facility] Anticipated discharge date:07/26/14 Family notified:pt son Transportation by [facility]- called at 3:30pm  CSW signing off.  Domenica Reamer, [place] Social Worker [phone]

## 2014-07-26 NOTE — Clinical Social Work Placement (Signed)
   CLINICAL SOCIAL WORK PLACEMENT  NOTE  Date:  07/26/2014  Patient Details  Name: Kirsten Flores MRN: [id number] Date of Birth: 1924-02-03  Clinical Social Work is seeking post-discharge placement for this patient at the [place] level of care (*CSW will initial, date and re-position this form in  chart as items are completed):  Yes   Patient/family provided with [place] Work Department's list of facilities offering this level of care within the geographic area requested by the patient (or if unable, by the patient's family).  Yes   Patient/family informed of their freedom to choose among providers that offer the needed level of care, that participate in Medicare, Medicaid or managed care program needed by the patient, have an available bed and are willing to accept the patient.  Yes   Patient/family informed of [place]'s ownership interest in [facility] and [facility], as well as of the fact that they are under no obligation to receive care at these facilities.  PASRR submitted to EDS on 07/24/14     PASRR number received on 07/24/14     Existing PASRR number confirmed on       FL2 transmitted to all facilities in geographic area requested by pt/family on 07/24/14     FL2 transmitted to all facilities within larger geographic area on       Patient informed that his/her managed care company has contracts with or will negotiate with certain facilities, including the following:        Yes   Patient/family informed of bed offers received.  Patient chooses bed at [facility]     Physician recommends and patient chooses bed at      Patient to be transferred to [facility] on 07/26/14.  Patient to be transferred to facility by ptar     Patient family notified on 07/26/14 of transfer.  Name of family member notified:  Mr. Kronberg (pt son)     PHYSICIAN       Additional Comment:     _______________________________________________ Cranford Mon, LCSW 07/26/2014, 3:21 PM

## 2014-07-26 NOTE — Discharge Summary (Signed)
Physician Discharge Summary  Kirsten Flores ASN:[id number] DOB: October 27, 1923 DOA: 07/22/2014  PCP: Garnet Koyanagi, DO  Admit date: 07/22/2014 Discharge date: 07/26/2014  Time spent: > 35 minutes  Recommendations for Outpatient Follow-up:  1. Patient presented to the hospital COPD flare will complete short course of probiotic therapy and after tomorrow will complete a short course of steroid therapy. 2. Recommend patient follow-up with primary care physician within the next week  Discharge Diagnoses:  Active Problems:   Severe obesity (BMI >= 40)   Hyperkalemia   Hypercapnia   Acute on chronic respiratory failure with hypercapnia   AKI (acute kidney injury)   Pressure ulcer   Discharge Condition: Stable  Diet recommendation: Heart healthy  Filed Weights   07/23/14 0418  Weight: 95.8 kg (211 lb 3.2 oz)    History of present illness:  Patient is an 81 year old with history of COPD presented with COPD exacerbation.  Hospital Course:   1. Acute hypercarbic resp failure -due to COPD flare -Patient improved with IV steroid-induced, nebs and Levaquin. We'll continue Levaquin for 3 more days to complete a seven-day treatment course, will administer 1 dose of oral steroid next a.m.  2. Elevated troponin -Suspect secondary to demand -Continue Plavix, 2-D echocardiogram with normal EF and wall motion -Unable to add beta blocker due to advanced COPD, medical management due to advanced age/severe COPD-son agreeable to this  3. Recent UTI metabolic encephalopathy -Reportedly completed Cipro course  4. Memory loss/dementia -Stable  5. Hypothyroidism -Continue Synthroid  6 mild AKI on CK D3.  -Improved, back at baseline   Procedures:  Echocardiogram  Consultations:  None  Discharge Exam: Filed Vitals:   07/26/14 1506  BP: 121/73  Pulse: 70  Temp: 98.7 F (37.1 C)  Resp: 15    General: Pt in nad, alert and awake Cardiovascular: rrr, no mrg Respiratory: cta bl, no  wheezes  Discharge Instructions   Discharge Instructions    Call MD for:  difficulty breathing, headache or visual disturbances    Complete by:  As directed      Call MD for:  severe uncontrolled pain    Complete by:  As directed      Call MD for:  temperature >100.4    Complete by:  As directed      Diet - low sodium heart healthy    Complete by:  As directed      Increase activity slowly    Complete by:  As directed           Current Discharge Medication List    START taking these medications   Details  levofloxacin (LEVAQUIN) 500 MG tablet Take 1 tablet (500 mg total) by mouth daily. Qty: 3 tablet, Refills: 0    predniSONE (DELTASONE) 50 MG tablet Take one tablet by mouth daily Qty: 1 tablet, Refills: 0      CONTINUE these medications which have NOT CHANGED   Details  acetaminophen (TYLENOL ARTHRITIS PAIN) 650 MG CR tablet Take 1,300 mg by mouth at bedtime as needed for pain.     atorvastatin (LIPITOR) 10 MG tablet Take 1 tablet (10 mg total) by mouth daily. Qty: 90 tablet, Refills: 1    betaxolol (BETOPTIC-S) 0.25 % ophthalmic suspension Place 1 drop into both eyes 2 (two) times daily.      Cholecalciferol (VITAMIN D PO) Take 1 tablet by mouth daily.    clopidogrel (PLAVIX) 75 MG tablet Take 1 tablet (75 mg total) by mouth daily with breakfast. Qty:  90 tablet, Refills: 3    feeding supplement, ENSURE ENLIVE, (ENSURE ENLIVE) LIQD Take 237 mLs by mouth 2 (two) times daily between meals. Qty: 237 mL, Refills: 0    fenofibrate (TRICOR) 145 MG tablet TAKE 1 BY MOUTH DAILY Qty: 90 tablet, Refills: 3    gabapentin (NEURONTIN) 600 MG tablet Take 1 tablet (600 mg total) by mouth at bedtime. Qty: 90 tablet, Refills: 3   Associated Diagnoses: Pain    levothyroxine (SYNTHROID, LEVOTHROID) 137 MCG tablet Take 1 tablet (137 mcg total) by mouth daily. Qty: 30 tablet, Refills: 0    Multiple Vitamin (MULTIVITAMIN WITH MINERALS) TABS tablet Take 1 tablet by mouth daily.     nystatin (MYCOSTATIN) 100000 UNIT/ML suspension Take 5 mLs (500,000 Units total) by mouth 4 (four) times daily. Qty: 150 mL, Refills: 0    pantoprazole (PROTONIX) 40 MG tablet Take 1 tablet (40 mg total) by mouth daily. Qty: 90 tablet, Refills: 3    sertraline (ZOLOFT) 50 MG tablet Take 1 tablet (50 mg total) by mouth daily. Qty: 90 tablet, Refills: 3   Associated Diagnoses: Depression      STOP taking these medications     atenolol (TENORMIN) 25 MG tablet      potassium chloride SA (K-DUR,KLOR-CON) 20 MEQ tablet      valsartan-hydrochlorothiazide (DIOVAN-HCT) 160-25 MG per tablet      ciprofloxacin (CIPRO) 500 MG tablet      HYDROcodone-acetaminophen (NORCO/VICODIN) 5-325 MG per tablet      tolterodine (DETROL LA) 4 MG 24 hr capsule        Allergies  Allergen Reactions  . Diltiazem Hcl Other (See Comments)    Unknown reaction  . Neomycin Other (See Comments)    Unknown reaction (a long time ago)      The results of significant diagnostics from this hospitalization (including imaging, microbiology, ancillary and laboratory) are listed below for reference.    Significant Diagnostic Studies: Dg Chest 2 View  07/11/2014   CLINICAL DATA:  Short of breath  EXAM: CHEST  2 VIEW  COMPARISON:  12/20/2013  FINDINGS: Chronic lung disease with increased lung markings due to fibrosis, unchanged from the prior study. Negative for infiltrate or effusion or mass lesion.  Severe compression fracture of approximately T12 is unchanged from the prior study.  IMPRESSION: Chronic lung disease with fibrosis. No superimposed acute abnormality.   Electronically Signed   By: Franchot Gallo M.D.   On: 07/11/2014 15:58   Ct Head Wo Contrast  07/22/2014   CLINICAL DATA:  Shortness breath and altered level of consciousness.  EXAM: CT HEAD WITHOUT CONTRAST  TECHNIQUE: Contiguous axial images were obtained from the base of the skull through the vertex without contrast.  COMPARISON:  07/11/2014   FINDINGS: There is diffuse cerebral atrophy which is stable. There is low density throughout the periventricular and subcortical white matter suggesting chronic changes which is also stable. No evidence for acute hemorrhage, mass lesion, midline shift, hydrocephalus or large infarct. Again noted is a large polyp involving the right maxillary sinus. No acute bone abnormality.  IMPRESSION: No acute intracranial abnormality.  Stable atrophy and evidence for chronic small vessel ischemic disease.   Electronically Signed   By: Markus Daft M.D.   On: 07/22/2014 11:42   Ct Head Wo Contrast  07/11/2014   CLINICAL DATA:  81 year-old female with new onset weakness, altered mental status. Initial encounter.  EXAM: CT HEAD WITHOUT CONTRAST  TECHNIQUE: Contiguous axial images were obtained from the  base of the skull through the vertex without intravenous contrast.  COMPARISON:  Brain MRI 07/21/2013 and earlier.  FINDINGS: Chronic small right maxillary sinus mucous retention cyst. Other Visualized paranasal sinuses and mastoids are clear. No acute osseous abnormality identified.  No acute orbit or scalp soft tissue findings.  Calcified atherosclerosis at the skull base.  Stable cerebral volume. Stable ventricle size and configuration. No midline shift, mass effect, or evidence of intracranial mass lesion. Gray-white matter differentiation is stable and normal for age. No suspicious intracranial vascular hyperdensity. No acute intracranial hemorrhage identified. No evidence of cortically based acute infarction identified.  IMPRESSION: No acute intracranial abnormality. Stable and largely negative for age noncontrast CT appearance of the brain.   Electronically Signed   By: Genevie Ann M.D.   On: 07/11/2014 16:13   Dg Chest Port 1 View  07/22/2014   CLINICAL DATA:  Shortness of breath and lethargic. Altered level of consciousness.  EXAM: PORTABLE CHEST - 1 VIEW  COMPARISON:  07/11/2014  FINDINGS: There are chronic lung  markings throughout the lungs, particularly in the right upper lobe. There has been minimal change from the previous examination. Evidence for old granulomatous disease demonstrated by scattered calcifications in the chest. Heart size is upper limits of normal but minimally changed. Slightly decreased lung volumes. Atherosclerotic calcifications at the aortic arch.  IMPRESSION: Low lung volumes with chronic lung changes.  No acute findings.   Electronically Signed   By: Markus Daft M.D.   On: 07/22/2014 11:39    Microbiology: Recent Results (from the past 240 hour(s))  Culture, blood (routine x 2)     Status: None (Preliminary result)   Collection Time: 07/22/14 11:50 AM  Result Value Ref Range Status   Specimen Description BLOOD LEFT WRIST  Final   Special Requests BOTTLES DRAWN AEROBIC AND ANAEROBIC 5CC EACH  Final   Culture   Final           BLOOD CULTURE RECEIVED NO GROWTH TO DATE CULTURE WILL BE HELD FOR 5 DAYS BEFORE ISSUING A FINAL NEGATIVE REPORT Performed at [facility]    Report Status PENDING  Incomplete  Culture, blood (routine x 2)     Status: None (Preliminary result)   Collection Time: 07/22/14 12:10 PM  Result Value Ref Range Status   Specimen Description BLOOD RIGHT FOREARM  Final   Special Requests BOTTLES DRAWN AEROBIC AND ANAEROBIC 5CC EACH  Final   Culture   Final           BLOOD CULTURE RECEIVED NO GROWTH TO DATE CULTURE WILL BE HELD FOR 5 DAYS BEFORE ISSUING A FINAL NEGATIVE REPORT Performed at [facility]    Report Status PENDING  Incomplete  Urine culture     Status: None   Collection Time: 07/22/14 12:47 PM  Result Value Ref Range Status   Specimen Description URINE, CATHETERIZED  Final   Special Requests NONE  Final   Colony Count NO GROWTH Performed at [facility]   Final   Culture NO GROWTH Performed at [facility]   Final   Report Status 07/23/2014 FINAL  Final  MRSA PCR Screening     Status: None   Collection  Time: 07/22/14  5:58 PM  Result Value Ref Range Status   MRSA by PCR NEGATIVE NEGATIVE Final    Comment:        The GeneXpert MRSA Assay (FDA approved for NASAL specimens only), is one component of a comprehensive MRSA colonization  surveillance program. It is not intended to diagnose MRSA infection nor to guide or monitor treatment for MRSA infections.      Labs: Basic Metabolic Panel:  Recent Labs Lab 07/22/14 1250 07/23/14 0625 07/24/14 0318 07/25/14 0508  NA 144 144 142 143  K 5.4* 5.0 4.8 4.4  CL 103 103 103 104  CO2 35* 35* 32 33*  GLUCOSE 109* 133* 145* 91  BUN 45* 41* 38* 34*  CREATININE 1.41* 1.25* 1.00 0.86  CALCIUM 10.1 9.5 9.7 9.4   Liver Function Tests: No results for input(s): AST, ALT, ALKPHOS, BILITOT, PROT, ALBUMIN in the last 168 hours. No results for input(s): LIPASE, AMYLASE in the last 168 hours. No results for input(s): AMMONIA in the last 168 hours. CBC:  Recent Labs Lab 07/22/14 1150 07/23/14 0625 07/24/14 0318 07/25/14 0508  WBC 6.8 3.4* 3.9* 3.4*  NEUTROABS 5.3  --   --   --   HGB 11.3* 10.6* 9.9* 9.9*  HCT 37.4 34.8* 32.1* 32.2*  MCV 109.0* 105.8* 105.2* 104.9*  PLT 176 158 173 164   Cardiac Enzymes:  Recent Labs Lab 07/22/14 1810 07/23/14 0010 07/23/14 0625  TROPONINI 0.91* 0.62* 0.55*   BNP: BNP (last 3 results)  Recent Labs  07/11/14 1525 07/22/14 1810  BNP 282.5* 715.8*    ProBNP (last 3 results) No results for input(s): PROBNP in the last 8760 hours.  CBG:  Recent Labs Lab 07/24/14 1123  GLUCAP 144*     Signed:  Velvet Bathe  [facility] 07/26/2014, 3:09 PM

## 2014-07-27 ENCOUNTER — Ambulatory Visit: Payer: Medicare Other | Admitting: Family Medicine

## 2014-07-28 LAB — CULTURE, BLOOD (ROUTINE X 2)
Culture: NO GROWTH
Culture: NO GROWTH

## 2014-08-13 ENCOUNTER — Telehealth: Payer: Self-pay | Admitting: Emergency Medicine

## 2014-08-13 ENCOUNTER — Emergency Department (HOSPITAL_COMMUNITY): Payer: Medicare Other

## 2014-08-13 ENCOUNTER — Inpatient Hospital Stay (HOSPITAL_COMMUNITY)
Admission: EM | Admit: 2014-08-13 | Discharge: 2014-08-26 | DRG: 190 | Disposition: A | Discharge status: Skilled Nursing Facility | Payer: Medicare Other | Attending: Internal Medicine | Admitting: Internal Medicine

## 2014-08-13 ENCOUNTER — Encounter (HOSPITAL_COMMUNITY): Payer: Self-pay | Admitting: *Deleted

## 2014-08-13 DIAGNOSIS — I272 Other secondary pulmonary hypertension: Secondary | ICD-10-CM | POA: Diagnosis present

## 2014-08-13 DIAGNOSIS — E8729 Other acidosis: Secondary | ICD-10-CM | POA: Diagnosis present

## 2014-08-13 DIAGNOSIS — G4733 Obstructive sleep apnea (adult) (pediatric): Secondary | ICD-10-CM | POA: Diagnosis present

## 2014-08-13 DIAGNOSIS — E039 Hypothyroidism, unspecified: Secondary | ICD-10-CM | POA: Diagnosis present

## 2014-08-13 DIAGNOSIS — I10 Essential (primary) hypertension: Secondary | ICD-10-CM | POA: Diagnosis present

## 2014-08-13 DIAGNOSIS — E538 Deficiency of other specified B group vitamins: Secondary | ICD-10-CM | POA: Diagnosis present

## 2014-08-13 DIAGNOSIS — R4182 Altered mental status, unspecified: Secondary | ICD-10-CM | POA: Diagnosis present

## 2014-08-13 DIAGNOSIS — J189 Pneumonia, unspecified organism: Secondary | ICD-10-CM | POA: Diagnosis not present

## 2014-08-13 DIAGNOSIS — I872 Venous insufficiency (chronic) (peripheral): Secondary | ICD-10-CM | POA: Diagnosis present

## 2014-08-13 DIAGNOSIS — E038 Other specified hypothyroidism: Secondary | ICD-10-CM | POA: Diagnosis present

## 2014-08-13 DIAGNOSIS — E785 Hyperlipidemia, unspecified: Secondary | ICD-10-CM | POA: Diagnosis present

## 2014-08-13 DIAGNOSIS — I5032 Chronic diastolic (congestive) heart failure: Secondary | ICD-10-CM | POA: Diagnosis not present

## 2014-08-13 DIAGNOSIS — J9621 Acute and chronic respiratory failure with hypoxia: Secondary | ICD-10-CM | POA: Diagnosis present

## 2014-08-13 DIAGNOSIS — Z515 Encounter for palliative care: Secondary | ICD-10-CM | POA: Diagnosis not present

## 2014-08-13 DIAGNOSIS — T17998D Other foreign object in respiratory tract, part unspecified causing other injury, subsequent encounter: Secondary | ICD-10-CM | POA: Diagnosis not present

## 2014-08-13 DIAGNOSIS — L988 Other specified disorders of the skin and subcutaneous tissue: Secondary | ICD-10-CM | POA: Diagnosis present

## 2014-08-13 DIAGNOSIS — I5033 Acute on chronic diastolic (congestive) heart failure: Secondary | ICD-10-CM | POA: Diagnosis not present

## 2014-08-13 DIAGNOSIS — J9622 Acute and chronic respiratory failure with hypercapnia: Secondary | ICD-10-CM

## 2014-08-13 DIAGNOSIS — R0602 Shortness of breath: Secondary | ICD-10-CM | POA: Insufficient documentation

## 2014-08-13 DIAGNOSIS — Z87891 Personal history of nicotine dependence: Secondary | ICD-10-CM

## 2014-08-13 DIAGNOSIS — R400 Somnolence: Secondary | ICD-10-CM | POA: Diagnosis not present

## 2014-08-13 DIAGNOSIS — R778 Other specified abnormalities of plasma proteins: Secondary | ICD-10-CM | POA: Diagnosis present

## 2014-08-13 DIAGNOSIS — Z6833 Body mass index (BMI) 33.0-33.9, adult: Secondary | ICD-10-CM | POA: Diagnosis not present

## 2014-08-13 DIAGNOSIS — T17998A Other foreign object in respiratory tract, part unspecified causing other injury, initial encounter: Secondary | ICD-10-CM | POA: Diagnosis not present

## 2014-08-13 DIAGNOSIS — R799 Abnormal finding of blood chemistry, unspecified: Secondary | ICD-10-CM | POA: Diagnosis not present

## 2014-08-13 DIAGNOSIS — J690 Pneumonitis due to inhalation of food and vomit: Secondary | ICD-10-CM | POA: Diagnosis present

## 2014-08-13 DIAGNOSIS — T17908A Unspecified foreign body in respiratory tract, part unspecified causing other injury, initial encounter: Secondary | ICD-10-CM | POA: Diagnosis present

## 2014-08-13 DIAGNOSIS — J9620 Acute and chronic respiratory failure, unspecified whether with hypoxia or hypercapnia: Secondary | ICD-10-CM | POA: Diagnosis present

## 2014-08-13 DIAGNOSIS — M8580 Other specified disorders of bone density and structure, unspecified site: Secondary | ICD-10-CM | POA: Diagnosis present

## 2014-08-13 DIAGNOSIS — J449 Chronic obstructive pulmonary disease, unspecified: Secondary | ICD-10-CM

## 2014-08-13 DIAGNOSIS — Z7902 Long term (current) use of antithrombotics/antiplatelets: Secondary | ICD-10-CM

## 2014-08-13 DIAGNOSIS — J9602 Acute respiratory failure with hypercapnia: Secondary | ICD-10-CM

## 2014-08-13 DIAGNOSIS — E870 Hyperosmolality and hypernatremia: Secondary | ICD-10-CM | POA: Diagnosis present

## 2014-08-13 DIAGNOSIS — Z79899 Other long term (current) drug therapy: Secondary | ICD-10-CM | POA: Diagnosis present

## 2014-08-13 DIAGNOSIS — Z7952 Long term (current) use of systemic steroids: Secondary | ICD-10-CM | POA: Diagnosis not present

## 2014-08-13 DIAGNOSIS — E876 Hypokalemia: Secondary | ICD-10-CM | POA: Diagnosis not present

## 2014-08-13 DIAGNOSIS — R7989 Other specified abnormal findings of blood chemistry: Secondary | ICD-10-CM | POA: Diagnosis not present

## 2014-08-13 DIAGNOSIS — E662 Morbid (severe) obesity with alveolar hypoventilation: Secondary | ICD-10-CM | POA: Diagnosis present

## 2014-08-13 DIAGNOSIS — Z66 Do not resuscitate: Secondary | ICD-10-CM | POA: Diagnosis present

## 2014-08-13 DIAGNOSIS — J9690 Respiratory failure, unspecified, unspecified whether with hypoxia or hypercapnia: Secondary | ICD-10-CM

## 2014-08-13 DIAGNOSIS — Z9981 Dependence on supplemental oxygen: Secondary | ICD-10-CM

## 2014-08-13 DIAGNOSIS — E872 Acidosis: Secondary | ICD-10-CM | POA: Diagnosis present

## 2014-08-13 DIAGNOSIS — E860 Dehydration: Secondary | ICD-10-CM | POA: Diagnosis present

## 2014-08-13 DIAGNOSIS — J90 Pleural effusion, not elsewhere classified: Secondary | ICD-10-CM | POA: Diagnosis present

## 2014-08-13 DIAGNOSIS — J9600 Acute respiratory failure, unspecified whether with hypoxia or hypercapnia: Secondary | ICD-10-CM | POA: Insufficient documentation

## 2014-08-13 DIAGNOSIS — J441 Chronic obstructive pulmonary disease with (acute) exacerbation: Secondary | ICD-10-CM | POA: Diagnosis present

## 2014-08-13 LAB — URINALYSIS, ROUTINE W REFLEX MICROSCOPIC
GLUCOSE, UA: NEGATIVE mg/dL
HGB URINE DIPSTICK: NEGATIVE
Ketones, ur: NEGATIVE mg/dL
Leukocytes, UA: NEGATIVE
Nitrite: NEGATIVE
PROTEIN: NEGATIVE mg/dL
Specific Gravity, Urine: 1.025 (ref 1.005–1.030)
Urobilinogen, UA: 2 mg/dL — ABNORMAL HIGH (ref 0.0–1.0)
pH: 6 (ref 5.0–8.0)

## 2014-08-13 LAB — I-STAT ARTERIAL BLOOD GAS, ED
ACID-BASE EXCESS: 16 mmol/L — AB (ref 0.0–2.0)
Bicarbonate: 47 mEq/L — ABNORMAL HIGH (ref 20.0–24.0)
O2 SAT: 93 %
PO2 ART: 80 mmHg (ref 80.0–100.0)
TCO2: 50 mmol/L (ref 0–100)
pCO2 arterial: 96.9 mmHg (ref 35.0–45.0)
pH, Arterial: 7.294 — ABNORMAL LOW (ref 7.350–7.450)

## 2014-08-13 LAB — CBC WITH DIFFERENTIAL/PLATELET
Basophils Absolute: 0 10*3/uL (ref 0.0–0.1)
Basophils Relative: 0 % (ref 0–1)
EOS ABS: 0 10*3/uL (ref 0.0–0.7)
Eosinophils Relative: 0 % (ref 0–5)
HCT: 39.2 % (ref 36.0–46.0)
Hemoglobin: 11.4 g/dL — ABNORMAL LOW (ref 12.0–15.0)
LYMPHS ABS: 1.1 10*3/uL (ref 0.7–4.0)
Lymphocytes Relative: 23 % (ref 12–46)
MCH: 31.8 pg (ref 26.0–34.0)
MCHC: 29.1 g/dL — ABNORMAL LOW (ref 30.0–36.0)
MCV: 109.2 fL — AB (ref 78.0–100.0)
MONOS PCT: 9 % (ref 3–12)
Monocytes Absolute: 0.4 10*3/uL (ref 0.1–1.0)
Neutro Abs: 3.2 10*3/uL (ref 1.7–7.7)
Neutrophils Relative %: 68 % (ref 43–77)
Platelets: 131 10*3/uL — ABNORMAL LOW (ref 150–400)
RBC: 3.59 MIL/uL — ABNORMAL LOW (ref 3.87–5.11)
RDW: 13.8 % (ref 11.5–15.5)
WBC: 4.7 10*3/uL (ref 4.0–10.5)

## 2014-08-13 LAB — BASIC METABOLIC PANEL
Anion gap: 8 (ref 5–15)
BUN: 15 mg/dL (ref 6–20)
CALCIUM: 9.5 mg/dL (ref 8.9–10.3)
CHLORIDE: 99 mmol/L — AB (ref 101–111)
CO2: 42 mmol/L — ABNORMAL HIGH (ref 22–32)
CREATININE: 0.73 mg/dL (ref 0.44–1.00)
GFR calc Af Amer: >60 mL/min (ref >60)
GFR calc non Af Amer: >60 mL/min (ref >60)
Glucose, Bld: 122 mg/dL — ABNORMAL HIGH (ref 65–99)
Potassium: 3.8 mmol/L (ref 3.5–5.1)
Sodium: 149 mmol/L — ABNORMAL HIGH (ref 135–145)

## 2014-08-13 LAB — BRAIN NATRIURETIC PEPTIDE: B Natriuretic Peptide: 454.8 pg/mL — ABNORMAL HIGH (ref 0.0–100.0)

## 2014-08-13 LAB — TROPONIN I: Troponin I: 0.08 ng/mL — ABNORMAL HIGH (ref <0.031)

## 2014-08-13 MED ORDER — ALBUTEROL SULFATE (2.5 MG/3ML) 0.083% IN NEBU
5.0000 mg | INHALATION_SOLUTION | Freq: Once | RESPIRATORY_TRACT | Status: AC
  Administered 2014-08-13: 5 mg via RESPIRATORY_TRACT
  Filled 2014-08-13: qty 6

## 2014-08-13 MED ORDER — VANCOMYCIN HCL IN DEXTROSE 1-5 GM/200ML-% IV SOLN
1000.0000 mg | Freq: Once | INTRAVENOUS | Status: DC
  Filled 2014-08-13: qty 200

## 2014-08-13 MED ORDER — ATORVASTATIN CALCIUM 10 MG PO TABS
10.0000 mg | ORAL_TABLET | Freq: Every day | ORAL | Status: DC
  Administered 2014-08-15 – 2014-08-26 (×12): 10 mg via ORAL
  Filled 2014-08-13 (×13): qty 1

## 2014-08-13 MED ORDER — LEVOTHYROXINE SODIUM 125 MCG PO TABS
125.0000 ug | ORAL_TABLET | Freq: Every day | ORAL | Status: DC
  Administered 2014-08-15 – 2014-08-26 (×12): 125 ug via ORAL
  Filled 2014-08-13 (×15): qty 1

## 2014-08-13 MED ORDER — SERTRALINE HCL 50 MG PO TABS
50.0000 mg | ORAL_TABLET | Freq: Every day | ORAL | Status: DC
  Administered 2014-08-15 – 2014-08-26 (×12): 50 mg via ORAL
  Filled 2014-08-13 (×13): qty 1

## 2014-08-13 MED ORDER — ENSURE ENLIVE PO LIQD
237.0000 mL | Freq: Two times a day (BID) | ORAL | Status: DC
  Administered 2014-08-14 – 2014-08-26 (×21): 237 mL via ORAL

## 2014-08-13 MED ORDER — METHYLPREDNISOLONE SODIUM SUCC 125 MG IJ SOLR
125.0000 mg | Freq: Once | INTRAMUSCULAR | Status: AC
  Administered 2014-08-13: 125 mg via INTRAVENOUS
  Filled 2014-08-13: qty 2

## 2014-08-13 MED ORDER — ENOXAPARIN SODIUM 40 MG/0.4ML ~~LOC~~ SOLN
40.0000 mg | SUBCUTANEOUS | Status: DC
  Administered 2014-08-14 – 2014-08-26 (×13): 40 mg via SUBCUTANEOUS
  Filled 2014-08-13 (×13): qty 0.4

## 2014-08-13 MED ORDER — VANCOMYCIN HCL 10 G IV SOLR
1500.0000 mg | Freq: Once | INTRAVENOUS | Status: AC
  Administered 2014-08-13: 1500 mg via INTRAVENOUS
  Filled 2014-08-13: qty 1500

## 2014-08-13 MED ORDER — PIPERACILLIN-TAZOBACTAM 3.375 G IVPB
3.3750 g | Freq: Three times a day (TID) | INTRAVENOUS | Status: DC
  Administered 2014-08-14 – 2014-08-20 (×20): 3.375 g via INTRAVENOUS
  Filled 2014-08-13 (×22): qty 50

## 2014-08-13 MED ORDER — IPRATROPIUM-ALBUTEROL 0.5-2.5 (3) MG/3ML IN SOLN
3.0000 mL | RESPIRATORY_TRACT | Status: DC
  Administered 2014-08-14 (×5): 3 mL via RESPIRATORY_TRACT
  Filled 2014-08-13 (×5): qty 3

## 2014-08-13 MED ORDER — PANTOPRAZOLE SODIUM 40 MG PO TBEC
40.0000 mg | DELAYED_RELEASE_TABLET | Freq: Every day | ORAL | Status: DC
  Administered 2014-08-15 – 2014-08-26 (×12): 40 mg via ORAL
  Filled 2014-08-13 (×10): qty 1

## 2014-08-13 MED ORDER — METHYLPREDNISOLONE SODIUM SUCC 125 MG IJ SOLR
60.0000 mg | Freq: Four times a day (QID) | INTRAMUSCULAR | Status: DC
  Administered 2014-08-14 (×3): 60 mg via INTRAVENOUS
  Filled 2014-08-13 (×2): qty 2
  Filled 2014-08-13 (×4): qty 0.96

## 2014-08-13 MED ORDER — SODIUM CHLORIDE 0.9 % IV SOLN
1250.0000 mg | INTRAVENOUS | Status: DC
  Administered 2014-08-14 – 2014-08-19 (×6): 1250 mg via INTRAVENOUS
  Filled 2014-08-13 (×7): qty 1250

## 2014-08-13 MED ORDER — CLOPIDOGREL BISULFATE 75 MG PO TABS
75.0000 mg | ORAL_TABLET | Freq: Every day | ORAL | Status: DC
  Administered 2014-08-15 – 2014-08-26 (×12): 75 mg via ORAL
  Filled 2014-08-13 (×15): qty 1

## 2014-08-13 MED ORDER — GABAPENTIN 300 MG PO CAPS
300.0000 mg | ORAL_CAPSULE | Freq: Every day | ORAL | Status: DC
  Administered 2014-08-14 – 2014-08-25 (×12): 300 mg via ORAL
  Filled 2014-08-13 (×14): qty 1

## 2014-08-13 MED ORDER — PIPERACILLIN-TAZOBACTAM 3.375 G IVPB 30 MIN
3.3750 g | Freq: Once | INTRAVENOUS | Status: AC
  Administered 2014-08-13: 3.375 g via INTRAVENOUS
  Filled 2014-08-13: qty 50

## 2014-08-13 NOTE — ED Provider Notes (Signed)
CSN: [id number]     Arrival date & time 08/13/14  1844 History   First MD Initiated Contact with Patient 08/13/14 1855     Chief Complaint  Patient presents with  . Shortness of Breath     (Consider location/radiation/quality/duration/timing/severity/associated sxs/prior Treatment) HPI Comments: 81 yo female presenting from her rehab facility with shortness of breath and hypoxia.  Per report, she is normal alert, but has been drowsy today.  O2 sats reported to be in 50's at facility.  Pt is unable to provide any significant history due to altered mental status.  Level V Caveat applies.  Patient is a 81 y.o. female presenting with shortness of breath.  Shortness of Breath Severity:  Severe   Past Medical History  Diagnosis Date  . Hyperlipidemia   . Hypertension   . Osteopenia   . Thyroid disease   . B12 deficiency   . Arthritis   . Memory loss   . Knee pain     RIGHT  . Gait disturbance    Past Surgical History  Procedure Laterality Date  . Appendectomy    . Cholecystectomy    . Abdominal hysterectomy    . Oophorectomy     Family History  Problem Relation Age of Onset  . Colon cancer    . Melanoma    . Cancer Other     COLON...1ST DEGREE RELATIVE   History  Substance Use Topics  . Smoking status: Former Smoker -- 2.00 packs/day for 25 years    Types: Cigarettes    Quit date: 03/03/1971  . Smokeless tobacco: Never Used  . Alcohol Use: Yes     Comment: 1 cocktail in the evening and a glass of wine - not everyday but doesn't state how often   OB History    No data available     Review of Systems  Unable to perform ROS: Mental status change  Respiratory: Positive for shortness of breath.       Allergies  Diltiazem hcl and Neomycin  Home Medications   Prior to Admission medications   Medication Sig Start Date End Date Taking? Authorizing Provider  acetaminophen (TYLENOL ARTHRITIS PAIN) 650 MG CR tablet Take 1,300 mg by mouth at bedtime as needed for  pain.     Historical Provider, MD  atorvastatin (LIPITOR) 10 MG tablet Take 1 tablet (10 mg total) by mouth daily. 05/28/14   Alferd Apa Lowne, DO  betaxolol (BETOPTIC-S) 0.25 % ophthalmic suspension Place 1 drop into both eyes 2 (two) times daily.      Historical Provider, MD  Cholecalciferol (VITAMIN D PO) Take 1 tablet by mouth daily.    Historical Provider, MD  clopidogrel (PLAVIX) 75 MG tablet Take 1 tablet (75 mg total) by mouth daily with breakfast. 08/07/13   Rosalita Chessman, DO  feeding supplement, ENSURE ENLIVE, (ENSURE ENLIVE) LIQD Take 237 mLs by mouth 2 (two) times daily between meals. 07/14/14   Robbie Lis, MD  fenofibrate (TRICOR) 145 MG tablet TAKE 1 BY MOUTH DAILY 06/25/14   Rosalita Chessman, DO  gabapentin (NEURONTIN) 600 MG tablet Take 1 tablet (600 mg total) by mouth at bedtime. 10/24/13   Rosalita Chessman, DO  levofloxacin (LEVAQUIN) 500 MG tablet Take 1 tablet (500 mg total) by mouth daily. 07/27/14   Velvet Bathe, MD  levothyroxine (SYNTHROID, LEVOTHROID) 137 MCG tablet Take 1 tablet (137 mcg total) by mouth daily. Patient taking differently: Take 125 mcg by mouth daily.  07/14/14   Alma  Vivi Ferns, MD  Multiple Vitamin (MULTIVITAMIN WITH MINERALS) TABS tablet Take 1 tablet by mouth daily.    Historical Provider, MD  nystatin (MYCOSTATIN) 100000 UNIT/ML suspension Take 5 mLs (500,000 Units total) by mouth 4 (four) times daily. 07/19/14   Alferd Apa Lowne, DO  pantoprazole (PROTONIX) 40 MG tablet Take 1 tablet (40 mg total) by mouth daily. 10/24/13   Rosalita Chessman, DO  predniSONE (DELTASONE) 50 MG tablet Take one tablet by mouth daily 07/27/14   Velvet Bathe, MD  sertraline (ZOLOFT) 50 MG tablet Take 1 tablet (50 mg total) by mouth daily. 10/24/13   Alferd Apa Lowne, DO   BP 134/72 mmHg  Pulse 88  Resp 19  SpO2 96% Physical Exam  Constitutional: She is oriented to person, place, and time. She appears well-developed and well-nourished. No distress.  HENT:  Head: Normocephalic and atraumatic.   Mouth/Throat: Oropharynx is clear and moist.  Eyes: Conjunctivae are normal. Pupils are equal, round, and reactive to light. No scleral icterus.  Neck: Neck supple.  Cardiovascular: Normal rate, regular rhythm, normal heart sounds and intact distal pulses.   No murmur heard. Pulmonary/Chest: No stridor. No tachypnea. She is in respiratory distress. She has decreased breath sounds. She has rales (bases).  Very poor air movement  Abdominal: Soft. Bowel sounds are normal. She exhibits no distension. There is no tenderness.  Musculoskeletal: Normal range of motion.  Neurological: She is alert and oriented to person, place, and time.  Lethargic, but arousable by voice.  Follows basic commands when aroused.  Answers basic questions with one word answers.    Skin: Skin is warm and dry. No rash noted.  Psychiatric: She has a normal mood and affect. Her behavior is normal.  Nursing note and vitals reviewed.   ED Course  CRITICAL CARE Performed by: Serita Grit Authorized by: Serita Grit Total critical care time: 45 minutes Critical care time was exclusive of separately billable procedures and treating other patients. Critical care was necessary to treat or prevent imminent or life-threatening deterioration of the following conditions: respiratory failure. Critical care was time spent personally by me on the following activities: development of treatment plan with patient or surrogate, discussions with consultants, evaluation of patient's response to treatment, examination of patient, obtaining history from patient or surrogate, ordering and performing treatments and interventions, ordering and review of laboratory studies, ordering and review of radiographic studies, pulse oximetry, re-evaluation of patient's condition and review of old charts.   (including critical care time) Labs Review Labs Reviewed  CBC WITH DIFFERENTIAL/PLATELET - Abnormal; Notable for the following:    RBC 3.59 (*)     Hemoglobin 11.4 (*)    MCV 109.2 (*)    MCHC 29.1 (*)    Platelets 131 (*)    All other components within normal limits  BASIC METABOLIC PANEL - Abnormal; Notable for the following:    Sodium 149 (*)    Chloride 99 (*)    CO2 42 (*)    Glucose, Bld 122 (*)    All other components within normal limits  URINALYSIS, ROUTINE W REFLEX MICROSCOPIC (NOT AT [facility]) - Abnormal; Notable for the following:    Color, Urine RED (*)    Bilirubin Urine MODERATE (*)    Urobilinogen, UA 2.0 (*)    All other components within normal limits  TROPONIN I - Abnormal; Notable for the following:    Troponin I 0.08 (*)    All other components within normal limits  BRAIN NATRIURETIC  PEPTIDE - Abnormal; Notable for the following:    B Natriuretic Peptide 454.8 (*)    All other components within normal limits  I-STAT ARTERIAL BLOOD GAS, ED - Abnormal; Notable for the following:    pH, Arterial 7.294 (*)    pCO2 arterial 96.9 (*)    Bicarbonate 47.0 (*)    Acid-Base Excess 16.0 (*)    All other components within normal limits  CULTURE, BLOOD (ROUTINE X 2)  CULTURE, BLOOD (ROUTINE X 2)    Imaging Review Dg Chest Port 1 View  08/13/2014   CLINICAL DATA:  Respiratory failure, on BiPAP  EXAM: PORTABLE CHEST - 1 VIEW  COMPARISON:  07/22/2014  FINDINGS: Patient is rotated.  Chronic interstitial markings.  Patchy bilateral lower lobe opacities, likely atelectasis. Suspected small right pleural effusion. No frank interstitial edema.  Cardiomegaly.  IMPRESSION: Patchy bilateral lower lobe opacities, likely atelectasis. Suspected small right pleural effusion.   Electronically Signed   By: Julian Hy M.D.   On: 08/13/2014 19:20     EKG Interpretation   Date/Time:  Monday August 13 2014 18:57:00 EDT Ventricular Rate:  105 PR Interval:  173 QRS Duration: 87 QT Interval:  342 QTC Calculation: 452 R Axis:   -170 Text Interpretation:  Sinus tachycardia with irregular rate Right axis  deviation Low voltage,  precordial leads compared to prior, rate increased  Confirmed by [facility]  MD, TREY ([id number]) on 08/13/2014 9:52:56 PM      MDM   Final diagnoses:  Respiratory failure  COPD exacerbation  Altered mental status, unspecified altered mental status type    81 yo female with hypoxic respiratory failure.  Recent admission for similar felt to be secondary to COPD exacerbation.  Dropped O2 sat's to 70's on 4L [place] during initial evaluation, then switched to BiPap.  She is DNR/DNI per EMS report and record review.    She has improved clinically on bipap.  Admit to stepdown.    Serita Grit, MD 08/13/14 2153

## 2014-08-13 NOTE — Telephone Encounter (Signed)
Per Dr. Lamonte Sakai, patient needs o2 titration study through DME. Send results to [place].  Patient needs to come in for OV, okay to see TP, but not urgent.  Order entered for titration study, left message for patient's son to call back.

## 2014-08-13 NOTE — Telephone Encounter (Signed)
LM for pt son [name] x 1

## 2014-08-13 NOTE — Progress Notes (Signed)
ANTIBIOTIC CONSULT NOTE - INITIAL  Pharmacy Consult for vancomycin and zosyn Indication: pneumonia  Allergies  Allergen Reactions  . Diltiazem Hcl Other (See Comments)    Unknown reaction  . Neomycin Other (See Comments)    Unknown reaction (a long time ago)    Patient Measurements:   Adjusted Body Weight:   Vital Signs: Temp: 98.8 F (37.1 C) (06/13 1937) Temp Source: Rectal (06/13 1937) BP: 130/47 mmHg (06/13 1938) Pulse Rate: 91 (06/13 1938) Intake/Output from previous day:   Intake/Output from this shift: Total I/O In: -  Out: 15 [Urine:15]  Labs: No results for input(s): WBC, HGB, PLT, LABCREA, CREATININE in the last 72 hours. CrCl cannot be calculated (Unknown ideal weight.). No results for input(s): VANCOTROUGH, VANCOPEAK, VANCORANDOM, GENTTROUGH, GENTPEAK, GENTRANDOM, TOBRATROUGH, TOBRAPEAK, TOBRARND, AMIKACINPEAK, AMIKACINTROU, AMIKACIN in the last 72 hours.   Medical History: Past Medical History  Diagnosis Date  . Hyperlipidemia   . Hypertension   . Osteopenia   . Thyroid disease   . B12 deficiency   . Arthritis   . Memory loss   . Knee pain     RIGHT  . Gait disturbance     Medications:   (Not in a hospital admission)   Assessment: 81 yo female admitted with SOB and hypoxia. Had a recent admission for acute on chronic respiratory failure. Initiating antibiotics for possible pneumonia.  WBC 3.4, SCr <1, eCrCl 40-43 ml/min.   Goal of Therapy:  Vancomycin trough level 15-20 mcg/ml  Plan:  Vancomycin 1500 mg IV x1 then 1250 mg IV q24h Zosyn 3.375 g IV q8h Monitor renal fx, cultures, VT as needed   Hughes Better, PharmD, BCPS Clinical Pharmacist Pager: [phone] 08/13/2014 8:01 PM

## 2014-08-13 NOTE — Telephone Encounter (Signed)
Spoke with [name] (pt son)-- Pt is in [employer] with multiple health issues - pt was seen in [facility] ED x 2 weeks d/t low O2 sats (60's) Pt was transferred to [facility] and admitted to respiratory dept with CO2 levels 95% D/c'd to [facility] x 1.5 weeks ago Using 2-3L/min O2  2-3L does not seem to be cutting it. Pt has been doing PT Pt very weak, fatigued and sleeping a lot - not eating well.  Son feels her O2 needs to be increased to 4L/min - was using 4L/min at home on concentrator [name] is wanting Dr Lamonte Sakai to call facility and speak with Dr Inda Merlin to discuss increasing O2 or orders be placed for this.  Contact # Nurse Supervisor : [phone]  Please advise Dr Lamonte Sakai. Thanks.

## 2014-08-13 NOTE — Telephone Encounter (Signed)
Spoke with pt son [name]. He reports pt is basically immobile 100% at this point. She isn't able to to move and or do anything any longer. HE reports she is not going to be able to do the titration study d/t this. She is basically just in a hospital bed in rehab per son. Pt son [name] has a lot of questions and would like to speak with RB personally. He is aware he is not in the office until tomorrow afternoon. Please advise RB thanks

## 2014-08-13 NOTE — H&P (Signed)
[facility] History and Physical  Patient: Kirsten Flores  MRN: [id number]  DOB: 17-May-1923  DOS: the patient was seen and examined on 08/13/2014 PCP: Garnet Koyanagi, DO  Referring physician: [facility] Chief Complaint: Shortness of breath  HPI: Kirsten Flores is a 81 y.o. female with Past medical history of dyslipidemia, hypertension, hypothyroidism, morbid obesity, COPD. The patient presents with complaints of sudden onset of shortness of breath which is progressively worsening associated with cough. The patient was recently hospitalized for possible COPD exacerbation with acute hypercarbia. The patient was discharged to nursing home. The nursing home the patient was placed on 2 L of nasal cannula as well as tapering dose of prednisone and currently taking 10 mg right now. As per discussion based on documentation available, the son felt that the patient has been using 4 L of oxygen at home and they were in the process of arranging that at the nursing home. There was no fall no trauma reported. No fever no chills reported no nausea no vomiting. No diarrhea. Patient has been more drowsy and gradually has been worsening and later on on routine evaluation she was found to be saturating 50% on her 2 L of nasal cannula and therefore was brought here. Initially the patient was confused and lethargic and was not responding to questions but later on the time of my evaluation after being on 2 hours of BiPAP she feels significantly better and is able to provide history.  The patient is coming from home. And at her baseline independent for most of her ADL.  Review of Systems: as mentioned in the history of present illness.  A comprehensive review of the other systems is negative.  Past Medical History  Diagnosis Date  . Hyperlipidemia   . Hypertension   . Osteopenia   . Thyroid disease   . B12 deficiency   . Arthritis   . Memory loss   . Knee pain     RIGHT  . Gait disturbance     Past Surgical History  Procedure Laterality Date  . Appendectomy    . Cholecystectomy    . Abdominal hysterectomy    . Oophorectomy     Social History:  reports that she quit smoking about 43 years ago. Her smoking use included Cigarettes. She has a 50 pack-year smoking history. She has never used smokeless tobacco. She reports that she drinks alcohol. She reports that she does not use illicit drugs.  Allergies  Allergen Reactions  . Diltiazem Hcl Other (See Comments)    Unknown reaction  . Neomycin Other (See Comments)    Unknown reaction (a long time ago)    Family History  Problem Relation Age of Onset  . Colon cancer    . Melanoma    . Cancer Other     COLON...1ST DEGREE RELATIVE    Prior to Admission medications   Medication Sig Start Date End Date Taking? Authorizing Provider  acetaminophen (TYLENOL ARTHRITIS PAIN) 650 MG CR tablet Take 1,300 mg by mouth at bedtime as needed for pain.    Yes Historical Provider, MD  atorvastatin (LIPITOR) 10 MG tablet Take 1 tablet (10 mg total) by mouth daily. 05/28/14  Yes Yvonne R Lowne, DO  betaxolol (BETOPTIC-S) 0.25 % ophthalmic suspension Place 1 drop into both eyes 2 (two) times daily.     Yes Historical Provider, MD  Cholecalciferol (VITAMIN D PO) Take 1 tablet by mouth daily.   Yes Historical Provider, MD  clopidogrel (PLAVIX) 75  MG tablet Take 1 tablet (75 mg total) by mouth daily with breakfast. 08/07/13  Yes Rosalita Chessman, DO  feeding supplement, ENSURE ENLIVE, (ENSURE ENLIVE) LIQD Take 237 mLs by mouth 2 (two) times daily between meals. 07/14/14  Yes Robbie Lis, MD  fenofibrate (TRICOR) 145 MG tablet TAKE 1 BY MOUTH DAILY 06/25/14  Yes Alferd Apa Lowne, DO  gabapentin (NEURONTIN) 600 MG tablet Take 1 tablet (600 mg total) by mouth at bedtime. 10/24/13  Yes Yvonne R Lowne, DO  levofloxacin (LEVAQUIN) 500 MG tablet Take 1 tablet (500 mg total) by mouth daily. 07/27/14  Yes Velvet Bathe, MD  levothyroxine (SYNTHROID, LEVOTHROID)  125 MCG tablet Take 125 mcg by mouth daily before breakfast.   Yes Historical Provider, MD  Multiple Vitamin (MULTIVITAMIN WITH MINERALS) TABS tablet Take 1 tablet by mouth daily.   Yes Historical Provider, MD  pantoprazole (PROTONIX) 40 MG tablet Take 1 tablet (40 mg total) by mouth daily. 10/24/13  Yes Rosalita Chessman, DO  predniSONE (DELTASONE) 50 MG tablet Take one tablet by mouth daily 07/27/14  Yes Velvet Bathe, MD  sertraline (ZOLOFT) 50 MG tablet Take 1 tablet (50 mg total) by mouth daily. 10/24/13  Yes Rosalita Chessman, DO  levothyroxine (SYNTHROID, LEVOTHROID) 137 MCG tablet Take 1 tablet (137 mcg total) by mouth daily. Patient not taking: Reported on 08/13/2014 07/14/14   Robbie Lis, MD  nystatin (MYCOSTATIN) 100000 UNIT/ML suspension Take 5 mLs (500,000 Units total) by mouth 4 (four) times daily. Patient not taking: Reported on 08/13/2014 07/19/14   Rosalita Chessman, DO    Physical Exam: Filed Vitals:   08/13/14 2200 08/13/14 2230 08/13/14 2233 08/13/14 2330  BP: 139/52 129/54  132/74  Pulse: 93 89  89  Temp:    98.4 F (36.9 C)  TempSrc:      Resp:    26  Height:    5\' 5"  (1.651 m)  Weight:    91.2 kg (201 lb 1 oz)  SpO2: 95% 96% 96% 97%    General: Alert, Awake and Oriented to Time, Place and Person. Appear in moderate distress Eyes: PERRL ENT: Oral Mucosa clear moist. Neck: Difficult to assess JVD Cardiovascular: S1 and S2 Present, aortic systolic Murmur, Peripheral Pulses Present Respiratory: Bilateral Air entry equal and Decreased,  Faint basal Crackles, no wheezes Abdomen: Bowel Sound present, Soft and non tender Skin: no Rash Extremities: Trace Pedal edema, no calf tenderness Neurologic: Grossly no focal neuro deficit.  Labs on Admission:  CBC:  Recent Labs Lab 08/13/14 2007  WBC 4.7  NEUTROABS 3.2  HGB 11.4*  HCT 39.2  MCV 109.2*  PLT 131*    CMP     Component Value Date/Time   NA 149* 08/13/2014 2007   K 3.8 08/13/2014 2007   CL 99* 08/13/2014 2007    CO2 42* 08/13/2014 2007   GLUCOSE 122* 08/13/2014 2007   BUN 15 08/13/2014 2007   CREATININE 0.73 08/13/2014 2007   CALCIUM 9.5 08/13/2014 2007   PROT 5.7* 07/12/2014 0528   ALBUMIN 3.2* 07/12/2014 0528   AST 25 07/12/2014 0528   ALT 18 07/12/2014 0528   ALKPHOS 38 07/12/2014 0528   BILITOT 1.0 07/12/2014 0528   GFRNONAA >60 08/13/2014 2007   GFRAA >60 08/13/2014 2007    No results for input(s): LIPASE, AMYLASE in the last 168 hours.   Recent Labs Lab 08/13/14 2007  TROPONINI 0.08*   BNP (last 3 results)  Recent Labs  07/11/14 1525 07/22/14  1810 08/13/14 2007  BNP 282.5* 715.8* 454.8*    ProBNP (last 3 results) No results for input(s): PROBNP in the last 8760 hours.   Radiological Exams on Admission: Dg Chest Port 1 View  08/13/2014   CLINICAL DATA:  Respiratory failure, on BiPAP  EXAM: PORTABLE CHEST - 1 VIEW  COMPARISON:  07/22/2014  FINDINGS: Patient is rotated.  Chronic interstitial markings.  Patchy bilateral lower lobe opacities, likely atelectasis. Suspected small right pleural effusion. No frank interstitial edema.  Cardiomegaly.  IMPRESSION: Patchy bilateral lower lobe opacities, likely atelectasis. Suspected small right pleural effusion.   Electronically Signed   By: Julian Hy M.D.   On: 08/13/2014 19:20   EKG: Independently reviewed. sinus tachycardia.  Assessment/Plan Principal Problem:   Acute and chronic respiratory failure (acute-on-chronic) Active Problems:   Hypothyroidism   Dyslipidemia   Essential hypertension   Severe obesity (BMI >= 40)   COPD exacerbation   Diastolic dysfunction with chronic heart failure   Elevated troponin   1. Acute and chronic respiratory failure (acute-on-chronic) The patient is presenting with complaints of acute onset of confusion as well as shortness of breath which is progressively worsening. Her ABG shows significant hypercarbia with PCO2 of 96 as well as respiratory acidosis. With this most likely  this is current presentation secondary to worsening of her baseline respiratory failure. Probable etiology is COPD flareup but my suspicion for obesity hypoventilation syndrome with polypharmacy is high. Currently I would continue BiPAP tonight and recheck her ABG. She may require CPAP or BiPAP daily at bedtime chronically. For suspected has consisted of pneumonia as well as COPD flareup she will be placed on steroid, duo nebs, vancomycin and Zosyn, will follow cultures as well as urine antigens.  2. Elevated BNP, chronic diastolic heart failure. Elevated troponin.  The patient appears to be having some elevated BNP and has chronic diastolic heart failure. At present I would closely monitor her avoid IV fluids. Elevated troponin appears to be secondary to do moderate ischemia. EKG does not have any acute changes. Monitor closely. Serial troponin. Recent echocardiogram did not show any evidence of wall motion abnormality.  3. Suspected polypharmacy. I'm reducing her gabapentin dose from 600 to 300 mg at bedtime. Also the timing on her second medication starting on 08/14/2014 night. And holding them on 08/13/2014 night.  4. Hypothyroidism. Continue levothyroxine.  5. Suspected aspiration. The patient is presenting with recurrent episodes of possible COPD flareup, she has  atelectasis versus infiltrate on the chest x-ray, with this I will evaluate her swallowing function to rule out any recurrent aspiration.  Advance goals of care discussion: DNR/DNI as per documentation available.   DVT Prophylaxis: subcutaneous Heparin Nutrition: Nothing by mouth  Disposition: Admitted as inpatient step-down unit.  Author: Berle Mull, MD [facility] Pager: [phone] 08/13/2014  If 7PM-7AM, please contact night-coverage [email or web address] Password [email or web address]

## 2014-08-13 NOTE — Telephone Encounter (Signed)
[phone] Kirsten Flores calling back

## 2014-08-13 NOTE — ED Notes (Signed)
Per [facility] pt was at a nursing facility when nurses observed her O2 Sat at 53%. Pt was discharged from hospital on las Wednesday for respiratory failure. She was given 2.5mg  of albuterol. EMS was called. EMS gave 5mg  of albuterol and 5mg  of atrovent. VS are as follows: BP: 122/53 HR:120 8L of oxygen.

## 2014-08-14 DIAGNOSIS — Z79899 Other long term (current) drug therapy: Secondary | ICD-10-CM

## 2014-08-14 DIAGNOSIS — I270 Primary pulmonary hypertension: Secondary | ICD-10-CM

## 2014-08-14 DIAGNOSIS — R7989 Other specified abnormal findings of blood chemistry: Secondary | ICD-10-CM | POA: Diagnosis present

## 2014-08-14 DIAGNOSIS — J189 Pneumonia, unspecified organism: Secondary | ICD-10-CM | POA: Diagnosis present

## 2014-08-14 DIAGNOSIS — E870 Hyperosmolality and hypernatremia: Secondary | ICD-10-CM

## 2014-08-14 DIAGNOSIS — I272 Pulmonary hypertension, unspecified: Secondary | ICD-10-CM | POA: Diagnosis present

## 2014-08-14 DIAGNOSIS — I5032 Chronic diastolic (congestive) heart failure: Secondary | ICD-10-CM | POA: Diagnosis present

## 2014-08-14 DIAGNOSIS — R778 Other specified abnormalities of plasma proteins: Secondary | ICD-10-CM | POA: Diagnosis present

## 2014-08-14 DIAGNOSIS — T17998A Other foreign object in respiratory tract, part unspecified causing other injury, initial encounter: Secondary | ICD-10-CM

## 2014-08-14 DIAGNOSIS — T17908A Unspecified foreign body in respiratory tract, part unspecified causing other injury, initial encounter: Secondary | ICD-10-CM | POA: Diagnosis present

## 2014-08-14 DIAGNOSIS — R4182 Altered mental status, unspecified: Secondary | ICD-10-CM | POA: Diagnosis present

## 2014-08-14 DIAGNOSIS — E038 Other specified hypothyroidism: Secondary | ICD-10-CM | POA: Diagnosis present

## 2014-08-14 LAB — CBC WITH DIFFERENTIAL/PLATELET
BASOS PCT: 0 % (ref 0–1)
Basophils Absolute: 0 10*3/uL (ref 0.0–0.1)
Eosinophils Absolute: 0 10*3/uL (ref 0.0–0.7)
Eosinophils Relative: 0 % (ref 0–5)
HCT: 36.2 % (ref 36.0–46.0)
Hemoglobin: 10.9 g/dL — ABNORMAL LOW (ref 12.0–15.0)
LYMPHS ABS: 0.2 10*3/uL — AB (ref 0.7–4.0)
Lymphocytes Relative: 3 % — ABNORMAL LOW (ref 12–46)
MCH: 31.7 pg (ref 26.0–34.0)
MCHC: 30.1 g/dL (ref 30.0–36.0)
MCV: 105.2 fL — ABNORMAL HIGH (ref 78.0–100.0)
MONO ABS: 0.3 10*3/uL (ref 0.1–1.0)
MONOS PCT: 5 % (ref 3–12)
NEUTROS ABS: 5.5 10*3/uL (ref 1.7–7.7)
Neutrophils Relative %: 92 % — ABNORMAL HIGH (ref 43–77)
Platelets: 125 10*3/uL — ABNORMAL LOW (ref 150–400)
RBC: 3.44 MIL/uL — AB (ref 3.87–5.11)
RDW: 13.9 % (ref 11.5–15.5)
WBC: 6 10*3/uL (ref 4.0–10.5)

## 2014-08-14 LAB — COMPREHENSIVE METABOLIC PANEL
ALBUMIN: 2.5 g/dL — AB (ref 3.5–5.0)
ALT: 25 U/L (ref 14–54)
AST: 35 U/L (ref 15–41)
Alkaline Phosphatase: 37 U/L — ABNORMAL LOW (ref 38–126)
Anion gap: 9 (ref 5–15)
BUN: 15 mg/dL (ref 6–20)
CHLORIDE: 102 mmol/L (ref 101–111)
CO2: 39 mmol/L — AB (ref 22–32)
CREATININE: 0.72 mg/dL (ref 0.44–1.00)
Calcium: 9.4 mg/dL (ref 8.9–10.3)
GFR calc Af Amer: >60 mL/min (ref >60)
GFR calc non Af Amer: >60 mL/min (ref >60)
Glucose, Bld: 173 mg/dL — ABNORMAL HIGH (ref 65–99)
POTASSIUM: 3.6 mmol/L (ref 3.5–5.1)
SODIUM: 150 mmol/L — AB (ref 135–145)
Total Bilirubin: 1.6 mg/dL — ABNORMAL HIGH (ref 0.3–1.2)
Total Protein: 4.7 g/dL — ABNORMAL LOW (ref 6.5–8.1)

## 2014-08-14 LAB — BLOOD GAS, ARTERIAL
Acid-Base Excess: 13.2 mmol/L — ABNORMAL HIGH (ref 0.0–2.0)
Bicarbonate: 38.6 mEq/L — ABNORMAL HIGH (ref 20.0–24.0)
DRAWN BY: 42624
FIO2: 0.5 %
Mode: POSITIVE
O2 SAT: 95.5 %
PEEP/CPAP: 5 cmH2O
PO2 ART: 72.3 mmHg — AB (ref 80.0–100.0)
Patient temperature: 98.6
Pressure control: 10 cmH2O
RATE: 10 resp/min
TCO2: 40.6 mmol/L (ref 0–100)
pCO2 arterial: 64.3 mmHg (ref 35.0–45.0)
pH, Arterial: 7.396 (ref 7.350–7.450)

## 2014-08-14 LAB — TROPONIN I
TROPONIN I: 0.08 ng/mL — AB (ref <0.031)
TROPONIN I: 0.06 ng/mL — AB (ref <0.031)
TROPONIN I: 0.08 ng/mL — AB (ref <0.031)
Troponin I: 0.08 ng/mL — ABNORMAL HIGH (ref <0.031)

## 2014-08-14 LAB — PROTIME-INR
INR: 1.28 (ref 0.00–1.49)
PROTHROMBIN TIME: 16.1 s — AB (ref 11.6–15.2)

## 2014-08-14 LAB — STREP PNEUMONIAE URINARY ANTIGEN: STREP PNEUMO URINARY ANTIGEN: NEGATIVE

## 2014-08-14 MED ORDER — DEXTROSE 5 % IV SOLN
INTRAVENOUS | Status: DC
Start: 2014-08-14 — End: 2014-08-15
  Administered 2014-08-14: 16:00:00 via INTRAVENOUS

## 2014-08-14 MED ORDER — IPRATROPIUM-ALBUTEROL 0.5-2.5 (3) MG/3ML IN SOLN
3.0000 mL | Freq: Four times a day (QID) | RESPIRATORY_TRACT | Status: DC
Start: 2014-08-14 — End: 2014-08-16
  Administered 2014-08-14 – 2014-08-16 (×7): 3 mL via RESPIRATORY_TRACT
  Filled 2014-08-14 (×7): qty 3

## 2014-08-14 MED ORDER — METHYLPREDNISOLONE SODIUM SUCC 125 MG IJ SOLR
60.0000 mg | Freq: Every day | INTRAMUSCULAR | Status: DC
  Filled 2014-08-14: qty 0.96

## 2014-08-14 MED ORDER — CHLORHEXIDINE GLUCONATE 0.12 % MT SOLN
15.0000 mL | Freq: Two times a day (BID) | OROMUCOSAL | Status: DC
  Administered 2014-08-14 – 2014-08-19 (×11): 15 mL via OROMUCOSAL
  Filled 2014-08-14 (×10): qty 15

## 2014-08-14 MED ORDER — DM-GUAIFENESIN ER 30-600 MG PO TB12
1.0000 | ORAL_TABLET | Freq: Two times a day (BID) | ORAL | Status: DC
  Administered 2014-08-14 – 2014-08-26 (×24): 1 via ORAL
  Filled 2014-08-14 (×25): qty 1

## 2014-08-14 MED ORDER — SODIUM CHLORIDE 0.9 % IV SOLN: INTRAVENOUS | Status: DC

## 2014-08-14 MED ORDER — CETYLPYRIDINIUM CHLORIDE 0.05 % MT LIQD
7.0000 mL | Freq: Two times a day (BID) | OROMUCOSAL | Status: DC
  Administered 2014-08-14 – 2014-08-18 (×10): 7 mL via OROMUCOSAL

## 2014-08-14 NOTE — Telephone Encounter (Signed)
Spoke with pt's son. He is aware that RB is not in the office until this afternoon.

## 2014-08-14 NOTE — Telephone Encounter (Signed)
[phone], pt son [name] cb to f/u

## 2014-08-14 NOTE — Progress Notes (Signed)
Utilization Review Completed.  

## 2014-08-14 NOTE — Progress Notes (Addendum)
**Note De-Identified  Obfuscation** Patient removed from BIPAP and placed on 6L [place] by RN.  SAT 94%.  RRT to continue to monitor.

## 2014-08-14 NOTE — Progress Notes (Signed)
SLP Cancellation Note  Patient Details Name: Kirsten Flores MRN: [id number] DOB: 1923/11/01   Cancelled treatment:       Reason Eval/Treat Not Completed: Medical issues which prohibited therapy. BiPAP   Alfa Leibensperger, Katherene Ponto 08/14/2014, 9:39 AM

## 2014-08-14 NOTE — Progress Notes (Signed)
[place] TEAM 1 - Stepdown/ICU TEAM Progress Note  Kirsten Flores IHK:[id number] DOB: [birth year] DOA: 08/13/2014 PCP: Garnet Koyanagi, DO  Admit HPI / Brief Narrative: Kirsten Flores is a 81 y.o. WF PMHx dyslipidemia, hypertension, hypothyroidism, morbid obesity, COPD on 4 L O2 at home per patient, OSA/OHS on CPAP at night.  Presents with complaints of sudden onset of shortness of breath which is progressively worsening associated with cough. The patient was recently hospitalized for possible COPD exacerbation with acute hypercarbia. The patient was discharged to nursing home. The nursing home the patient was placed on 2 L of nasal cannula as well as tapering dose of prednisone and currently taking 10 mg right now. As per discussion based on documentation available, the son felt that the patient has been using 4 L of oxygen at home and they were in the process of arranging that at the nursing home. There was no fall no trauma reported. No fever no chills reported no nausea no vomiting. No diarrhea. Patient has been more drowsy and gradually has been worsening and later on on routine evaluation she was found to be saturating 50% on her 2 L of nasal cannula and therefore was brought here. Initially the patient was confused and lethargic and was not responding to questions but later on the time of my evaluation after being on 2 hours of BiPAP she feels significantly better and is able to provide history.  The patient is coming from home. And at her baseline independent for most of her ADL.  HPI/Subjective: 6/14 A/O 4, NAD, states is on 4 L O2 at home. States has never had her cardiac murmur evaluated (note 5/23 echocardiogram results below)  Assessment/Plan: Acute and chronic respiratory failure (acute-on-chronic) -Most likely multifactorial to include OSA/OHS, polypharmacy, COPD exacerbation and incorrect SNF O2 regimen. -Admission note appears patient was placed on 2 L O2 at nursing home,  when patient was certain her daily regimen is 4 L O2.  -Contact SNF in the a.m. to verify appropriate O2 regimen is being used during the day and appropriate CPAP/BiPAP settings are being used at night.  Suspected aspiration. -Patient has passed RN bedside swallow study   OSA/OHS -Continue CPAP or BiPAP at night for respiratory to maintain SPO2 89-93%  COPD exacerbation -Continue current antibiotics for 7 day course -Decrease Solu-Medrol to 60 mg daily -Decrease DuoNeb to QID -Flutter valve -Mucinex DM BID 6/14 Legionella urinary antigen pending 6/14 strep pneumo urine antigen pending 6/14 sputum culture pending  Elevated BNP, chronic diastolic heart failure. -Patient asymptomatic but with mildly elevated troponin  -Trend troponins and if they continue to be elevated consult cardiology  Pulmonary hypertension -Serial troponin. Recent echocardiogram did not show any evidence of wall motion abnormality.  Suspected polypharmacy. -I'm reducing her gabapentin dose from 600 to 300 mg at bedtime. -Hold all other sedating medication   Hypothyroidism. -Continue levothyroxine 125 g daily -Order TSH.  Hypernatremia - start D5W 36ml/hr   Advance goals of care discussion: DNR/DNI as per documentation available.     Code Status: FULL Family Communication: no family present at time of exam Disposition Plan: SNF    Consultants:   Procedure/Significant Events: 5/23 echocardiogram;Left ventricle: moderate concentric hypertrophy.-LVEF= 65% to 70%.  - (grade 1 diastolic dysfunction). - Aortic valve: Trileaflet; moderately thickened, moderately calcified leaflets.  - Left atrium: moderately dilated. - Tricuspid valve:  moderate regurgitation. - Pulmonary arteries:PA peak pressure: 59 mm Hg (S).    Culture 6/13 blood pending 6/14 Legionella urinary  antigen pending 6/14 strep pneumo urine antigen pending 6/14 sputum culture pending   Antibiotics: Zosyn  6/13>> Vancomycin 6/13>>  DVT prophylaxis: Lovenox    Devices    LINES / TUBES:      Continuous Infusions: . sodium chloride Stopped (08/14/14 1400)  . dextrose 50 mL/hr at 08/14/14 1543    Objective: VITAL SIGNS: Temp: 98.6 F (37 C) (06/14 1641) Temp Source: Axillary (06/14 1641) BP: 132/35 mmHg (06/14 1602) Pulse Rate: 91 (06/14 1602) SPO2; FIO2:   Intake/Output Summary (Last 24 hours) at 08/14/14 1857 Last data filed at 08/14/14 1800  Gross per 24 hour  Intake 801.17 ml  Output    315 ml  Net 486.17 ml     Exam: General: Slow to answer, A/O 4, NAD, No acute respiratory distress, multiple skin lesions on head and bilateral lower extremity (cancers?) Eyes: Negative headache, negative retinal hemorrhage ENT: Negative Runny nose, negative ear pain, negative tinnitus, negative gingival bleeding Neck:  Negative scars, masses, torticollis, lymphadenopathy, JVD Lungs: Clear to auscultation bilaterally without wheezes or crackles Cardiovascular: Regular rate and rhythm positive systolic murmur 3/6, negative gallop or rub normal S1 and S2 Abdomen: Morbidly obese, negative abdominal pain, negative dysphagia, Nontender, nondistended, soft, bowel sounds positive, no rebound, no ascites, no appreciable mass Extremities: No significant cyanosis, clubbing, or edema bilateral lower extremities, multiple skin lesions bilateral lower extremity Psychiatric:  Negative depression, negative anxiety, negative fatigue, negative mania  Neurologic:  Cranial nerves II through XII intact, tongue/uvula midline, all extremities muscle strength 5/5, sensation intact negative dysarthria, negative expressive aphasia, negative receptive aphasia.     Data Reviewed: Basic Metabolic Panel:  Recent Labs Lab 08/13/14 2007 08/14/14 0800  NA 149* 150*  K 3.8 3.6  CL 99* 102  CO2 42* 39*  GLUCOSE 122* 173*  BUN 15 15  CREATININE 0.73 0.72  CALCIUM 9.5 9.4   Liver Function  Tests:  Recent Labs Lab 08/14/14 0800  AST 35  ALT 25  ALKPHOS 37*  BILITOT 1.6*  PROT 4.7*  ALBUMIN 2.5*   No results for input(s): LIPASE, AMYLASE in the last 168 hours. No results for input(s): AMMONIA in the last 168 hours. CBC:  Recent Labs Lab 08/13/14 2007 08/14/14 0800  WBC 4.7 6.0  NEUTROABS 3.2 5.5  HGB 11.4* 10.9*  HCT 39.2 36.2  MCV 109.2* 105.2*  PLT 131* 125*   Cardiac Enzymes:  Recent Labs Lab 08/13/14 2007 08/14/14 0232 08/14/14 0800 08/14/14 1245  TROPONINI 0.08* 0.08* 0.06* 0.08*   BNP (last 3 results)  Recent Labs  07/11/14 1525 07/22/14 1810 08/13/14 2007  BNP 282.5* 715.8* 454.8*    ProBNP (last 3 results) No results for input(s): PROBNP in the last 8760 hours.  CBG: No results for input(s): GLUCAP in the last 168 hours.  No results found for this or any previous visit (from the past 240 hour(s)).   Studies:  Recent x-ray studies have been reviewed in detail by the Attending Physician  Scheduled Meds:  Scheduled Meds: . antiseptic oral rinse  7 mL Mouth Rinse q12n4p  . atorvastatin  10 mg Oral Daily  . chlorhexidine  15 mL Mouth Rinse BID  . clopidogrel  75 mg Oral Q breakfast  . enoxaparin (LOVENOX) injection  40 mg Subcutaneous Q24H  . feeding supplement (ENSURE ENLIVE)  237 mL Oral BID BM  . gabapentin  300 mg Oral QHS  . ipratropium-albuterol  3 mL Nebulization Q4H  . levothyroxine  125 mcg Oral QAC breakfast  .  methylPREDNISolone (SOLU-MEDROL) injection  60 mg Intravenous Q6H  . pantoprazole  40 mg Oral Daily  . piperacillin-tazobactam (ZOSYN)  IV  3.375 g Intravenous 3 times per day  . sertraline  50 mg Oral Daily  . vancomycin  1,250 mg Intravenous Q24H    Time spent on care of this patient: 40 mins   WOODS, Geraldo Docker , MD  [facility] Office  [phone] Pager [phone]  On-Call/Text Page:      [email or web address]      password [email or web address]  If 7PM-7AM, please contact  night-coverage [email or web address] Password [facility] 08/14/2014, 6:57 PM   LOS: 1 day   Care during the described time interval was provided by me .  I have reviewed this patient's available data, including medical history, events of note, physical examination, and all test results as part of my evaluation. I have personally reviewed and interpreted all radiology studies.   Dia Crawford, MD [phone] Pager

## 2014-08-14 NOTE — Procedures (Signed)
Pt is resting well on 6L [place].  At this time there is no need for BIPAP.  RT will continue to monitor pt and place if needed or requested.

## 2014-08-14 NOTE — Telephone Encounter (Signed)
I have spoken with RB in person about this. He states that he will call the pt's son at his earliest convenience.

## 2014-08-15 DIAGNOSIS — J189 Pneumonia, unspecified organism: Secondary | ICD-10-CM

## 2014-08-15 DIAGNOSIS — T17998D Other foreign object in respiratory tract, part unspecified causing other injury, subsequent encounter: Secondary | ICD-10-CM

## 2014-08-15 LAB — LEGIONELLA ANTIGEN, URINE

## 2014-08-15 LAB — TROPONIN I
TROPONIN I: 0.08 ng/mL — AB (ref <0.031)
Troponin I: 0.1 ng/mL — ABNORMAL HIGH (ref <0.031)

## 2014-08-15 LAB — TSH: TSH: 0.43 u[IU]/mL (ref 0.350–4.500)

## 2014-08-15 MED ORDER — SODIUM CHLORIDE 0.45 % IV SOLN
INTRAVENOUS | Status: DC
  Administered 2014-08-15 – 2014-08-17 (×3): via INTRAVENOUS

## 2014-08-15 MED ORDER — PREDNISONE 20 MG PO TABS
40.0000 mg | ORAL_TABLET | Freq: Every day | ORAL | Status: DC
  Administered 2014-08-16 – 2014-08-20 (×5): 40 mg via ORAL
  Filled 2014-08-15 (×7): qty 2

## 2014-08-15 NOTE — Progress Notes (Signed)
Placed pt. On bipap for rest. Pt. Tolerating well at this time. RT to monitor.

## 2014-08-15 NOTE — Evaluation (Addendum)
Clinical/Bedside Swallow Evaluation Patient Details  Name: Kirsten Flores MRN: [id number] Date of Birth: Jun 13, 1923  Today's Date: 08/15/2014 Time: SLP Start Time (ACUTE ONLY): 5176 SLP Stop Time (ACUTE ONLY): 1545 SLP Time Calculation (min) (ACUTE ONLY): 15 min  Past Medical History:  Past Medical History  Diagnosis Date  . Hyperlipidemia   . Hypertension   . Osteopenia   . Thyroid disease   . B12 deficiency   . Arthritis   . Memory loss   . Knee pain     RIGHT  . Gait disturbance    Past Surgical History:  Past Surgical History  Procedure Laterality Date  . Appendectomy    . Cholecystectomy    . Abdominal hysterectomy    . Oophorectomy     HPI:  Pt is a 81 y.o. female with PMH of COPD (on hom O2- 2-4L, hypothyroid, HTN, and memory loss.Was recently hospitalized and discharged 5/26 to SNF. Readmitted 6/13 for shortness of breath progressively worsening and associated with cough. CXR revealed patchy bilateral LL opacities, likely atelectasis and suspected small R pleural effusion. Had bedside swallow eval on recent admission- recommendation was dysphagia 3/ thin liquids. Pt currently on soft diet. Bedside swallow eval ordered to assess swallow function and aid in ruling out aspiration.    Assessment / Plan / Recommendation Clinical Impression  Pt showed no overt s/s of aspiration at bedside- swallow appeared timely with adequate hyolaryngeal excursion. Pt reported no difficulties at home; however on previous admission pt had expressed difficulites with foods that have small, dry pieces. Given current decreased respiratory status (currently on nasal cannula but on BiPAP earlier today) and concerns from CXR, pt's risk of aspiration appears moderate at this time with regular solid foods. Recommend initiating dysphagia 3 diet, thin liquids, meds whole with puree, intermittent supervision to cue pt for small bites/ sips. SLP will f/u x1 to ensure diet tolerance.     Aspiration Risk  Moderate    Diet Recommendation Dysphagia 3 (Mech soft);Thin   Medication Administration: Whole meds with puree Compensations: Slow rate;Small sips/bites    Other  Recommendations Oral Care Recommendations: Oral care BID   Follow Up Recommendations       Frequency and Duration min 1 x/week  1 week   Pertinent Vitals/Pain none    SLP Swallow Goals     Swallow Study Prior Functional Status       General Other Pertinent Information: Pt is a 81 y.o. female with PMH of COPD (on hom O2- 2-4L, hypothyroid, HTN, and memory loss.Was recently hospitalized and discharged 5/26 to SNF. Readmitted 6/13 for shortness of breath progressively worsening and associated with cough. CXR revealed patchy bilateral LL opacities, likely atelectasis and suspected small R pleural effusion. Had bedside swallow eval on recent admission- recommendation was dysphagia 3/ thin liquids. Pt currently on soft diet. Bedside swallow eval ordered to assess swallow function and aid in ruling out aspiration.  Type of Study: Bedside swallow evaluation Previous Swallow Assessment: BSE May 2016- dysphagia 3/ thin rec Diet Prior to this Study:  (diet soft) Temperature Spikes Noted: No Respiratory Status: Supplemental O2 delivered via (comment) History of Recent Intubation: No Behavior/Cognition: Alert;Cooperative Oral Cavity - Dentition: Other (Comment) (dentures) Self-Feeding Abilities: Able to feed self Patient Positioning: Upright in bed Baseline Vocal Quality: Normal Volitional Cough: Strong Volitional Swallow: Unable to elicit    Oral/Motor/Sensory Function Overall Oral Motor/Sensory Function: Appears within functional limits for tasks assessed Labial ROM: Within Functional Limits Labial Symmetry: Within Functional Limits  Labial Strength: Within Functional Limits Labial Sensation: Within Functional Limits Lingual ROM: Within Functional Limits Lingual Symmetry: Within Functional Limits Lingual Strength:  Within Functional Limits Lingual Sensation: Within Functional Limits   Ice Chips Ice chips: Not tested   Thin Liquid Thin Liquid: Within functional limits Presentation: Cup;Straw    Nectar Thick Nectar Thick Liquid: Not tested   Honey Thick Honey Thick Liquid: Not tested   Puree Puree: Within functional limits Presentation: Spoon   Solid   GO    Solid: Within functional limits       Kern Reap, MA, CCC-SLP 08/15/2014,3:54 PM

## 2014-08-15 NOTE — Procedures (Signed)
Due to constant desats, pt was placed on BIPAP.

## 2014-08-15 NOTE — Progress Notes (Signed)
TEAM 1 - Stepdown/ICU TEAM Progress Note  Kirsten Flores DXI:[id number] DOB: [birth year] DOA: 08/13/2014 PCP: Garnet Koyanagi, DO  Admit HPI / Brief Narrative: 81 yo F w/ Hx dyslipidemia, hypertension, hypothyroidism, morbid obesity, COPD on 4 L O2 at home, OSA/OHS on CPAP at night who presented with sudden onset of shortness of breath associated with cough.  The patient had recently been hospitalized for a COPD exacerbation with acute hypercarbia and discharged to a nursing home.  At the nursing home the patient was reportedly placed on 2L [place] O2 as well as a tapering dose of prednisone. Of note her son felt she had been using 4 L of oxygen at home.  Patient had been more drowsy and on routine evaluation she was found to be saturating 50% on her 2 L of nasal cannula, prompting her transfer to the ED.  In the ED she rapidly improved after initiation of BIPAP.   HPI/Subjective: The patient is alert and conversant.  At the time of visit she appears to be having difficulty managing her secretions.  She is coughing frequently and deeply.  She reports an ongoing sensation of shortness of breath but feels that she does feel somewhat better in general.  She denies chest pain nausea vomiting or abdominal pain.  She does report a good appetite.  Assessment/Plan:  Acute on chronic hypoxic and hypercarbic respiratory failure  -multifactorial to include OSA/OHS, polypharmacy, COPD exacerbation, ?aspiration pneumonitis, and incorrect SNF O2 regimen - required return to BIPAP last night due to persisting hypoxia   Possible aspiration -to have formal SLP eval   OSA/OHS -Continue CPAP or BiPAP at night for respiratory to maintain SPO2 89-93%  COPD exacerbation -Continue current antibiotics for 7 day course - no wheezing on exam today - wean steroids asap  Chronic diastolic congestive heart failure -no clinical evidence of marked volume overload at this time - follow Is/Os - baseline weight appears  to be approximately 95 kg - patient currently 91.2 kg  Mildly elevated troponin -Likely related to respiratory distress - thus far has peaked at 0.1  Pulmonary hypertension  Polypharmacy -Holding all sedating medications - reduce dose of neurontin   Hypothyroidism. -Continue levothyroxine - TSH 0.43  Hypernatremia -Most consistent with mild/minus dehydration - continue gentle hydration  Code Status: DNR - NO CODE  Family Communication: no family present at time of exam Disposition Plan: SDU due to need for BiPAP  Consultants: none  Procedure/Significant Events: none  Antibiotics: Zosyn 6/13 > Vancomycin 6/13 >  DVT prophylaxis: Lovenox   Objective: Blood pressure 148/45, pulse 74, temperature 98 F (36.7 C), temperature source Axillary, resp. rate 28, height 5\' 5"  (1.651 m), weight 91.2 kg (201 lb 1 oz), SpO2 92 %.  Intake/Output Summary (Last 24 hours) at 08/15/14 0924 Last data filed at 08/15/14 0800  Gross per 24 hour  Intake 1751.17 ml  Output    300 ml  Net 1451.17 ml   Exam: General: No acute respiratory distress but frequent hectic coughing - alert and oriented Lungs: Fine crackles bilateral bases with distant breath sounds throughout other fields but no active wheezing Cardiovascular: Regular rate and rhythm with 3/6 holosystolic murmur Abdomen: Nontender, nondistended, soft, bowel sounds positive, no rebound, no ascites, no appreciable mass Extremities: No significant cyanosis, clubbing, or edema bilateral lower extremities  Data Reviewed: Basic Metabolic Panel:  Recent Labs Lab 08/13/14 2007 08/14/14 0800  NA 149* 150*  K 3.8 3.6  CL 99* 102  CO2 42* 39*  GLUCOSE 122* 173*  BUN 15 15  CREATININE 0.73 0.72  CALCIUM 9.5 9.4   Liver Function Tests:  Recent Labs Lab 08/14/14 0800  AST 35  ALT 25  ALKPHOS 37*  BILITOT 1.6*  PROT 4.7*  ALBUMIN 2.5*   CBC:  Recent Labs Lab 08/13/14 2007 08/14/14 0800  WBC 4.7 6.0  NEUTROABS 3.2  5.5  HGB 11.4* 10.9*  HCT 39.2 36.2  MCV 109.2* 105.2*  PLT 131* 125*   Cardiac Enzymes:  Recent Labs Lab 08/14/14 0800 08/14/14 1245 08/14/14 2015 08/15/14 0043 08/15/14 0650  TROPONINI 0.06* 0.08* 0.08* 0.08* 0.10*    Recent Results (from the past 240 hour(s))  Blood culture (routine x 2)     Status: None (Preliminary result)   Collection Time: 08/13/14  7:56 PM  Result Value Ref Range Status   Specimen Description BLOOD RIGHT ARM  Final   Special Requests   Final    BOTTLES DRAWN AEROBIC AND ANAEROBIC 10CC BLUE, 5CC RED   Culture   Final           BLOOD CULTURE RECEIVED NO GROWTH TO DATE CULTURE WILL BE HELD FOR 5 DAYS BEFORE ISSUING A FINAL NEGATIVE REPORT Performed at [facility]    Report Status PENDING  Incomplete  Blood culture (routine x 2)     Status: None (Preliminary result)   Collection Time: 08/13/14  8:09 PM  Result Value Ref Range Status   Specimen Description BLOOD RIGHT HAND  Final   Special Requests BOTTLES DRAWN AEROBIC ONLY 5CC  Final   Culture   Final           BLOOD CULTURE RECEIVED NO GROWTH TO DATE CULTURE WILL BE HELD FOR 5 DAYS BEFORE ISSUING A FINAL NEGATIVE REPORT Performed at [facility]    Report Status PENDING  Incomplete     Studies:  Recent x-ray studies have been reviewed in detail by the Attending Physician  Scheduled Meds:  Scheduled Meds: . antiseptic oral rinse  7 mL Mouth Rinse q12n4p  . atorvastatin  10 mg Oral Daily  . chlorhexidine  15 mL Mouth Rinse BID  . clopidogrel  75 mg Oral Q breakfast  . dextromethorphan-guaiFENesin  1 tablet Oral BID  . enoxaparin (LOVENOX) injection  40 mg Subcutaneous Q24H  . feeding supplement (ENSURE ENLIVE)  237 mL Oral BID BM  . gabapentin  300 mg Oral QHS  . ipratropium-albuterol  3 mL Nebulization QID  . levothyroxine  125 mcg Oral QAC breakfast  . methylPREDNISolone (SOLU-MEDROL) injection  60 mg Intravenous Daily  . pantoprazole  40 mg Oral Daily  .  piperacillin-tazobactam (ZOSYN)  IV  3.375 g Intravenous 3 times per day  . sertraline  50 mg Oral Daily  . vancomycin  1,250 mg Intravenous Q24H    Time spent on care of this patient: 35 mins  Cherene Altes, MD [facility] For Consults/Admissions - Flow Manager - [phone] Office  [phone]  Contact MD directly via text page:      [email or web address]      password [email or web address]  08/15/2014, 9:24 AM   LOS: 2 days

## 2014-08-16 DIAGNOSIS — J9621 Acute and chronic respiratory failure with hypoxia: Secondary | ICD-10-CM | POA: Diagnosis present

## 2014-08-16 DIAGNOSIS — G4733 Obstructive sleep apnea (adult) (pediatric): Secondary | ICD-10-CM | POA: Diagnosis present

## 2014-08-16 DIAGNOSIS — E876 Hypokalemia: Secondary | ICD-10-CM | POA: Diagnosis present

## 2014-08-16 LAB — CBC
HEMATOCRIT: 32 % — AB (ref 36.0–46.0)
HEMOGLOBIN: 9.7 g/dL — AB (ref 12.0–15.0)
MCH: 31.5 pg (ref 26.0–34.0)
MCHC: 30.3 g/dL (ref 30.0–36.0)
MCV: 103.9 fL — ABNORMAL HIGH (ref 78.0–100.0)
Platelets: 103 10*3/uL — ABNORMAL LOW (ref 150–400)
RBC: 3.08 MIL/uL — AB (ref 3.87–5.11)
RDW: 14.7 % (ref 11.5–15.5)
WBC: 3.2 10*3/uL — ABNORMAL LOW (ref 4.0–10.5)

## 2014-08-16 LAB — COMPREHENSIVE METABOLIC PANEL
ALT: 20 U/L (ref 14–54)
ANION GAP: 6 (ref 5–15)
AST: 26 U/L (ref 15–41)
Albumin: 2.1 g/dL — ABNORMAL LOW (ref 3.5–5.0)
Alkaline Phosphatase: 36 U/L — ABNORMAL LOW (ref 38–126)
BILIRUBIN TOTAL: 1.1 mg/dL (ref 0.3–1.2)
BUN: 13 mg/dL (ref 6–20)
CHLORIDE: 99 mmol/L — AB (ref 101–111)
CO2: 41 mmol/L — ABNORMAL HIGH (ref 22–32)
Calcium: 8.7 mg/dL — ABNORMAL LOW (ref 8.9–10.3)
Creatinine, Ser: 0.58 mg/dL (ref 0.44–1.00)
GFR calc Af Amer: >60 mL/min (ref >60)
GLUCOSE: 84 mg/dL (ref 65–99)
POTASSIUM: 3.2 mmol/L — AB (ref 3.5–5.1)
SODIUM: 146 mmol/L — AB (ref 135–145)
Total Protein: 4.5 g/dL — ABNORMAL LOW (ref 6.5–8.1)

## 2014-08-16 LAB — TROPONIN I: TROPONIN I: 0.08 ng/mL — AB (ref <0.031)

## 2014-08-16 MED ORDER — POTASSIUM CHLORIDE CRYS ER 20 MEQ PO TBCR
40.0000 meq | EXTENDED_RELEASE_TABLET | Freq: Once | ORAL | Status: AC
  Administered 2014-08-16: 40 meq via ORAL
  Filled 2014-08-16: qty 2

## 2014-08-16 MED ORDER — IPRATROPIUM-ALBUTEROL 0.5-2.5 (3) MG/3ML IN SOLN: 3.0000 mL | RESPIRATORY_TRACT | Status: DC | PRN

## 2014-08-16 NOTE — Progress Notes (Signed)
[place] for vancomycin and zosyn Indication: pneumonia  Allergies  Allergen Reactions  . Diltiazem Hcl Other (See Comments)    Unknown reaction  . Neomycin Other (See Comments)    Unknown reaction (a long time ago)    Patient Measurements: Height: 5\' 5"  (165.1 cm) Weight: 216 lb 11.4 oz (98.3 kg) IBW/kg (Calculated) : 57 Adjusted Body Weight:   Vital Signs: Temp: 98.4 F (36.9 C) (06/16 0730) Temp Source: Oral (06/16 0730) BP: 114/94 mmHg (06/16 0832) Pulse Rate: 93 (06/16 0832) Intake/Output from previous day: 06/15 0701 - 06/16 0700 In: 2147 [P.O.:437; I.V.:1310; IV Piggyback:400] Out: -  Intake/Output from this shift: Total I/O In: 60 [I.V.:60] Out: -   Labs:  Recent Labs  08/13/14 2007 08/14/14 0800 08/16/14 0313  WBC 4.7 6.0 3.2*  HGB 11.4* 10.9* 9.7*  PLT 131* 125* 103*  CREATININE 0.73 0.72 0.58   Estimated Creatinine Clearance: 55.3 mL/min (by C-G formula based on Cr of 0.58). No results for input(s): VANCOTROUGH, VANCOPEAK, VANCORANDOM, GENTTROUGH, GENTPEAK, GENTRANDOM, TOBRATROUGH, TOBRAPEAK, TOBRARND, AMIKACINPEAK, AMIKACINTROU, AMIKACIN in the last 72 hours.   Assessment: 81 yo female admitted with SOB and hypoxia. Had a recent admission for acute on chronic respiratory failure. Started antibiotics for possible pneumonia.    Afebrile, wbc low at 3.2, scr normal.   6/13 vanc > 6/13 zosyn >  6/13 blood TR:VUYE Goal of Therapy:  Vancomycin trough level 15-20 mcg/ml  Plan:  Vancomycin 1250 mg IV q24h - check trough in 1-2 days Zosyn 3.375 g IV q8h Monitor renal fx, cultures, VT as needed  Erin Hearing PharmD., BCPS Clinical Pharmacist Pager [phone] 08/16/2014 8:48 AM

## 2014-08-16 NOTE — Telephone Encounter (Signed)
RB does not want this message closed at this time. He will contact the pt's son as soon as he can and he will document once it's done.

## 2014-08-16 NOTE — Progress Notes (Signed)
RT placed pt on auto titrate CPAP to maintain SpO2 >89%. RT will continue to monitor as needed.

## 2014-08-16 NOTE — Progress Notes (Addendum)
Kirsten Flores TEAM 1 - Stepdown/ICU TEAM Progress Note  Kirsten Flores QVZ:[id number] DOB: May 11, 1923 DOA: 08/13/2014 PCP: Garnet Koyanagi, DO  Admit HPI / Brief Narrative: Kirsten Flores is a 81 y.o. WF PMHx dyslipidemia, hypertension, hypothyroidism, morbid obesity, COPD on 4 L O2 at home per patient, OSA/OHS on CPAP at night.  Presents with complaints of sudden onset of shortness of breath which is progressively worsening associated with cough. The patient was recently hospitalized for possible COPD exacerbation with acute hypercarbia. The patient was discharged to nursing home. The nursing home the patient was placed on 2 L of nasal cannula as well as tapering dose of prednisone and currently taking 10 mg right now. As per discussion based on documentation available, the son felt that the patient has been using 4 L of oxygen at home and they were in the process of arranging that at the nursing home. There was no fall no trauma reported. No fever no chills reported no nausea no vomiting. No diarrhea. Patient has been more drowsy and gradually has been worsening and later on on routine evaluation she was found to be saturating 50% on her 2 L of nasal cannula and therefore was brought here. Initially the patient was confused and lethargic and was not responding to questions but later on the time of my evaluation after being on 2 hours of BiPAP she feels significantly better and is able to provide history.  The patient is coming from home. And at her baseline independent for most of her ADL.  HPI/Subjective: 6/16 A/O 4, NAD, states is on 4 L O2 at home. Cardiac murmur evaluated (note 5/23 echocardiogram results below). States lesions on her for head evaluated in the past and she declined further surgery.  Assessment/Plan: Acute and chronic respiratory failure (acute-on-chronic) with hypoxia -Most likely multifactorial to include OSA/OHS, polypharmacy, COPD exacerbation and incorrect SNF O2  regimen. - Son verifies Pt should be on 24 hr/dy 4 L O2.  -Never on  CPAP/BiPAP except when in Hospital.  OSA/OHS -Continue CPAP or BiPAP PRN to maintain SPO2 89-93%  COPD exacerbation -Continue current antibiotics for 7 day course -Decrease Solu-Medrol to 60 mg daily -Decrease DuoNeb to PRN -Flutter valve -Mucinex DM BID -6/14 Legionella urinary antigen negative -6/14 strep pneumo urine antigen pending -Ambulatory SPO2 -Ambulate patient per shift  Elevated BNP, chronic diastolic heart failure. -Asymptomatic continued mildly elevated troponin  -Likely related to respiratory distress - thus far has peaked at 0.1  Pulmonary hypertension -BP appropriate for a 81 year old would not start new medication at this time unless patient became symptomatic.  Suspected polypharmacy. -Continue gabapentin dose 300 mg at bedtime. -Hold all other sedating medication   Hypothyroidism. -Continue levothyroxine 125 g daily -TSH. Within normal limit  Hypernatremia - Continue 0.45% saline at 64ml/hr   Hypokalemia -Potassium Goal > 4 -K Dur 40 mEq -Recheck potassium and magnesium in a.m.   Advance goals of care discussion: DNR/DNI as per documentation available.     Code Status: FULL Family Communication: Son present at time of exam Disposition Plan: SNF    Consultants:   Procedure/Significant Events: 5/23 echocardiogram;Left ventricle: moderate concentric hypertrophy.-LVEF= 65% to 70%.  - (grade 1 diastolic dysfunction). - Aortic valve: Trileaflet; moderately thickened, moderately calcified leaflets.  - Left atrium: moderately dilated. - Tricuspid valve:  moderate regurgitation. - Pulmonary arteries:PA peak pressure: 59 mm Hg (S).    Culture 6/13 blood right arm/hand NGTD 6/14 Legionella urinary antigen native 6/14 strep pneumo urine antigen pending 6/14  sputum culture not collected   Antibiotics: Zosyn 6/13>> Vancomycin 6/13>>  DVT prophylaxis: Lovenox     Devices    LINES / TUBES:      Continuous Infusions: . sodium chloride 60 mL/hr at 08/15/14 1044    Objective: VITAL SIGNS: Temp: 97.8 F (36.6 C) (06/16 1142) Temp Source: Oral (06/16 1142) BP: 117/36 mmHg (06/16 1142) Pulse Rate: 80 (06/16 1142) SPO2; FIO2:   Intake/Output Summary (Last 24 hours) at 08/16/14 1645 Last data filed at 08/16/14 1600  Gross per 24 hour  Intake   2127 ml  Output      0 ml  Net   2127 ml     Exam: General: A/O 4, NAD, No acute respiratory distress, multiple skin lesions on head and bilateral lower extremity (has been addressed by dermatologist) Eyes: Negative headache, negative retinal hemorrhage ENT: Negative Runny nose, negative ear pain, negative tinnitus, negative gingival bleeding Neck:  Negative scars, masses, torticollis, lymphadenopathy, JVD Lungs: Clear to auscultation bilaterally without wheezes or crackles Cardiovascular: Regular rate and rhythm positive systolic murmur 3/6, negative gallop or rub normal S1 and S2 Abdomen: Morbidly obese, negative abdominal pain, negative dysphagia, Nontender, nondistended, soft, bowel sounds positive, no rebound, no ascites, no appreciable mass Extremities: No significant cyanosis, clubbing, or edema bilateral lower extremities, multiple skin lesions bilateral lower extremity Psychiatric:  Negative depression, negative anxiety, negative fatigue, negative mania  Neurologic:  Cranial nerves II through XII intact, tongue/uvula midline, all extremities muscle strength 5/5, sensation intact negative dysarthria, negative expressive aphasia, negative receptive aphasia.     Data Reviewed: Basic Metabolic Panel:  Recent Labs Lab 08/13/14 2007 08/14/14 0800 08/16/14 0313  NA 149* 150* 146*  K 3.8 3.6 3.2*  CL 99* 102 99*  CO2 42* 39* 41*  GLUCOSE 122* 173* 84  BUN 15 15 13   CREATININE 0.73 0.72 0.58  CALCIUM 9.5 9.4 8.7*   Liver Function Tests:  Recent Labs Lab 08/14/14 0800  08/16/14 0313  AST 35 26  ALT 25 20  ALKPHOS 37* 36*  BILITOT 1.6* 1.1  PROT 4.7* 4.5*  ALBUMIN 2.5* 2.1*   No results for input(s): LIPASE, AMYLASE in the last 168 hours. No results for input(s): AMMONIA in the last 168 hours. CBC:  Recent Labs Lab 08/13/14 2007 08/14/14 0800 08/16/14 0313  WBC 4.7 6.0 3.2*  NEUTROABS 3.2 5.5  --   HGB 11.4* 10.9* 9.7*  HCT 39.2 36.2 32.0*  MCV 109.2* 105.2* 103.9*  PLT 131* 125* 103*   Cardiac Enzymes:  Recent Labs Lab 08/14/14 1245 08/14/14 2015 08/15/14 0043 08/15/14 0650 08/16/14 0313  TROPONINI 0.08* 0.08* 0.08* 0.10* 0.08*   BNP (last 3 results)  Recent Labs  07/11/14 1525 07/22/14 1810 08/13/14 2007  BNP 282.5* 715.8* 454.8*    ProBNP (last 3 results) No results for input(s): PROBNP in the last 8760 hours.  CBG: No results for input(s): GLUCAP in the last 168 hours.  Recent Results (from the past 240 hour(s))  Blood culture (routine x 2)     Status: None (Preliminary result)   Collection Time: 08/13/14  7:56 PM  Result Value Ref Range Status   Specimen Description BLOOD RIGHT ARM  Final   Special Requests   Final    BOTTLES DRAWN AEROBIC AND ANAEROBIC 10CC BLUE, 5CC RED   Culture   Final           BLOOD CULTURE RECEIVED NO GROWTH TO DATE CULTURE WILL BE HELD FOR 5 DAYS BEFORE ISSUING A  FINAL NEGATIVE REPORT Performed at [facility]    Report Status PENDING  Incomplete  Blood culture (routine x 2)     Status: None (Preliminary result)   Collection Time: 08/13/14  8:09 PM  Result Value Ref Range Status   Specimen Description BLOOD RIGHT HAND  Final   Special Requests BOTTLES DRAWN AEROBIC ONLY 5CC  Final   Culture   Final           BLOOD CULTURE RECEIVED NO GROWTH TO DATE CULTURE WILL BE HELD FOR 5 DAYS BEFORE ISSUING A FINAL NEGATIVE REPORT Performed at [facility]    Report Status PENDING  Incomplete     Studies:  Recent x-ray studies have been reviewed in detail by the Attending  Physician  Scheduled Meds:  Scheduled Meds: . antiseptic oral rinse  7 mL Mouth Rinse q12n4p  . atorvastatin  10 mg Oral Daily  . chlorhexidine  15 mL Mouth Rinse BID  . clopidogrel  75 mg Oral Q breakfast  . dextromethorphan-guaiFENesin  1 tablet Oral BID  . enoxaparin (LOVENOX) injection  40 mg Subcutaneous Q24H  . feeding supplement (ENSURE ENLIVE)  237 mL Oral BID BM  . gabapentin  300 mg Oral QHS  . levothyroxine  125 mcg Oral QAC breakfast  . pantoprazole  40 mg Oral Daily  . piperacillin-tazobactam (ZOSYN)  IV  3.375 g Intravenous 3 times per day  . predniSONE  40 mg Oral Q breakfast  . sertraline  50 mg Oral Daily  . vancomycin  1,250 mg Intravenous Q24H    Time spent on care of this patient: 40 mins   WOODS, Geraldo Docker , MD  [facility] Office  [phone] Pager [phone]  On-Call/Text Page:      [email or web address]      password [email or web address]  If 7PM-7AM, please contact night-coverage [email or web address] Password [email or web address] 08/16/2014, 4:45 PM   LOS: 3 days   Care during the described time interval was provided by me .  I have reviewed this patient's available data, including medical history, events of note, physical examination, and all test results as part of my evaluation. I have personally reviewed and interpreted all radiology studies.   Dia Crawford, MD [phone] Pager

## 2014-08-16 NOTE — Telephone Encounter (Signed)
RB please advise if this message can be closed.  Thanks!

## 2014-08-16 NOTE — Progress Notes (Signed)
Pt woke up and stated she does not want bipap anymore tonight. Placed pt back on 4L [place]. Will continue to monitor.

## 2014-08-17 ENCOUNTER — Inpatient Hospital Stay (HOSPITAL_COMMUNITY): Payer: Medicare Other

## 2014-08-17 DIAGNOSIS — R400 Somnolence: Secondary | ICD-10-CM

## 2014-08-17 LAB — COMPREHENSIVE METABOLIC PANEL
ALK PHOS: 31 U/L — AB (ref 38–126)
ALK PHOS: 37 U/L — AB (ref 38–126)
ALT: 19 U/L (ref 14–54)
ALT: 24 U/L (ref 14–54)
AST: 24 U/L (ref 15–41)
AST: 34 U/L (ref 15–41)
Albumin: 2.2 g/dL — ABNORMAL LOW (ref 3.5–5.0)
Albumin: 2.3 g/dL — ABNORMAL LOW (ref 3.5–5.0)
Anion gap: 3 — ABNORMAL LOW (ref 5–15)
Anion gap: 8 (ref 5–15)
BILIRUBIN TOTAL: 0.7 mg/dL (ref 0.3–1.2)
BUN: 11 mg/dL (ref 6–20)
BUN: 13 mg/dL (ref 6–20)
CALCIUM: 8.7 mg/dL — AB (ref 8.9–10.3)
CHLORIDE: 101 mmol/L (ref 101–111)
CO2: 40 mmol/L — AB (ref 22–32)
CO2: 32 mmol/L (ref 22–32)
Calcium: 8.8 mg/dL — ABNORMAL LOW (ref 8.9–10.3)
Chloride: 101 mmol/L (ref 101–111)
Creatinine, Ser: 0.58 mg/dL (ref 0.44–1.00)
Creatinine, Ser: 0.66 mg/dL (ref 0.44–1.00)
GFR calc Af Amer: >60 mL/min (ref >60)
GFR calc Af Amer: >60 mL/min (ref >60)
GFR calc non Af Amer: >60 mL/min (ref >60)
GFR calc non Af Amer: >60 mL/min (ref >60)
Glucose, Bld: 99 mg/dL (ref 65–99)
Glucose, Bld: 152 mg/dL — ABNORMAL HIGH (ref 65–99)
POTASSIUM: 4 mmol/L (ref 3.5–5.1)
POTASSIUM: 4.2 mmol/L (ref 3.5–5.1)
SODIUM: 144 mmol/L (ref 135–145)
Sodium: 141 mmol/L (ref 135–145)
Total Bilirubin: 1 mg/dL (ref 0.3–1.2)
Total Protein: 4.4 g/dL — ABNORMAL LOW (ref 6.5–8.1)
Total Protein: 4.6 g/dL — ABNORMAL LOW (ref 6.5–8.1)

## 2014-08-17 LAB — POCT I-STAT 3, ART BLOOD GAS (G3+)
Acid-Base Excess: 9 mmol/L — ABNORMAL HIGH (ref 0.0–2.0)
Bicarbonate: 38.8 mEq/L — ABNORMAL HIGH (ref 20.0–24.0)
O2 Saturation: 96 %
PCO2 ART: 81.9 mmHg — AB (ref 35.0–45.0)
PO2 ART: 98 mmHg (ref 80.0–100.0)
Patient temperature: 98.6
TCO2: 41 mmol/L (ref 0–100)
pH, Arterial: 7.284 — ABNORMAL LOW (ref 7.350–7.450)

## 2014-08-17 LAB — CBC WITH DIFFERENTIAL/PLATELET
BASOS ABS: 0 10*3/uL (ref 0.0–0.1)
Basophils Relative: 0 % (ref 0–1)
Eosinophils Absolute: 0 10*3/uL (ref 0.0–0.7)
Eosinophils Relative: 1 % (ref 0–5)
HCT: 31.1 % — ABNORMAL LOW (ref 36.0–46.0)
Hemoglobin: 9.6 g/dL — ABNORMAL LOW (ref 12.0–15.0)
LYMPHS ABS: 0.6 10*3/uL — AB (ref 0.7–4.0)
LYMPHS PCT: 20 % (ref 12–46)
MCH: 32.2 pg (ref 26.0–34.0)
MCHC: 30.9 g/dL (ref 30.0–36.0)
MCV: 104.4 fL — AB (ref 78.0–100.0)
Monocytes Absolute: 0.3 10*3/uL (ref 0.1–1.0)
Monocytes Relative: 10 % (ref 3–12)
NEUTROS PCT: 70 % (ref 43–77)
Neutro Abs: 1.9 10*3/uL (ref 1.7–7.7)
Platelets: 117 10*3/uL — ABNORMAL LOW (ref 150–400)
RBC: 2.98 MIL/uL — AB (ref 3.87–5.11)
RDW: 14 % (ref 11.5–15.5)
WBC: 2.8 10*3/uL — AB (ref 4.0–10.5)

## 2014-08-17 LAB — STREP PNEUMONIAE ANTIBODY SEROTYPES
STREP PNEUMONIAE TYPE 14 ABS: 3.9 ug/mL
STREP PNEUMONIAE TYPE 6B ABS: 10.1 ug/mL
STREP PNEUMONIAE TYPE 9N ABS: 2.1 ug/mL
Strep pneumo Type 12: <0.3 ug/mL
Strep pneumo Type 19: 8.9 ug/mL
Strep pneumo Type 4: 0.4 ug/mL
Strep pneumo Type 9: 4.7 ug/mL
Strep pneumoniae Type 1 Abs: 2.3 ug/mL
Strep pneumoniae Type 18C Abs: 7.2 ug/mL
Strep pneumoniae Type 23F Abs: 4.4 ug/mL
Strep pneumoniae Type 3 Abs: 0.6 ug/mL
Strep pneumoniae Type 5 Abs: 13.1 ug/mL
Strep pneumoniae Type 7F Abs: 2.4 ug/mL
Strep pneumoniae Type 8 Abs: 1.9 ug/mL

## 2014-08-17 LAB — VANCOMYCIN, TROUGH: VANCOMYCIN TR: 12 ug/mL (ref 10.0–20.0)

## 2014-08-17 LAB — MAGNESIUM: MAGNESIUM: 1.8 mg/dL (ref 1.7–2.4)

## 2014-08-17 LAB — TROPONIN I: TROPONIN I: 0.05 ng/mL — AB (ref <0.031)

## 2014-08-17 MED ORDER — IOHEXOL 350 MG/ML SOLN
80.0000 mL | Freq: Once | INTRAVENOUS | Status: AC | PRN
  Administered 2014-08-17: 80 mL via INTRAVENOUS

## 2014-08-17 NOTE — Evaluation (Signed)
Occupational Therapy Evaluation Patient Details Name: Kirsten Flores MRN: [id number] DOB: [birth year] Today's Date: 08/17/2014    History of Present Illness Pt with h/o mixed COPD (on home O2- 4L), hypothyroid, HTN, and memory loss.Patient admitted with respiratory failure from [facility] with plan to return    Clinical Impression   Pt reports being able to walk with a walker and assistance at the SNF and perform UB bathing, dressing, grooming and feeding while there.  She demonstrates decreased tolerance of bed level mobility with 02 sats decreasing to 87% briefly with rolling.  Pt is able to self feed with set up and supervision for small bites and groom with min assist at bed level.  Plan is for pt to return to SNF for further rehab.  Will defer further OT to SNF.    Follow Up Recommendations  SNF;Supervision/Assistance - 24 hour    Equipment Recommendations       Recommendations for Other Services       Precautions / Restrictions Precautions Precautions: Fall Precaution Comments: watch sats Restrictions Weight Bearing Restrictions: No      Mobility Bed Mobility Overal bed mobility: Needs Assistance Bed Mobility: Rolling Rolling: Max assist         General bed mobility comments: assisted to change soiled linen, rub pt's back  Transfers                      Balance                                            ADL Overall ADL's : Needs assistance/impaired Eating/Feeding: Minimal assistance;Bed level Eating/Feeding Details (indicate cue type and reason): Pt requiring set up of food to prevent her from taking large bites, used cup with lid to decrease spillage due to tremor. Grooming: Wash/dry hands;Wash/dry face;Brushing hair;Bed level;Minimal assistance   Upper Body Bathing: Maximal assistance;Bed level   Lower Body Bathing: Total assistance;+2 for physical assistance;Bed level   Upper Body Dressing : Moderate assistance;Bed level    Lower Body Dressing: Total assistance;Bed level                       Vision Additional Comments: able to read small print   Perception     Praxis      Pertinent Vitals/Pain Pain Assessment: Faces Pain Score: 4  Faces Pain Scale: Hurts little more Pain Location: back Pain Descriptors / Indicators: Grimacing Pain Intervention(s): Limited activity within patient's tolerance;Monitored during session;Repositioned     Hand Dominance Right   Extremity/Trunk Assessment Upper Extremity Assessment Upper Extremity Assessment: Generalized weakness (mild B tremor)   Lower Extremity Assessment Lower Extremity Assessment: Defer to PT evaluation       Communication Communication Communication: HOH   Cognition Arousal/Alertness: Awake/alert Behavior During Therapy: WFL for tasks assessed/performed Overall Cognitive Status: No family/caregiver present to determine baseline cognitive functioning                 General Comments: pt with decreased problem solving and not using call button appropriately when needed   General Comments       Exercises       Shoulder Instructions      Home Living Family/patient expects to be discharged to:: Skilled nursing facility     Type of Home: House Home Access: Ramped entrance  Home Layout: One level               Home Equipment: Walker - 2 wheels;Tub bench;Bedside commode          Prior Functioning/Environment Level of Independence: Needs assistance  Gait / Transfers Assistance Needed: pt states she has been ambulating with RW and using WC unable to state how far she walks ADL's / Homemaking Assistance Needed: pt was self feeding, performing seated grooming and UB bathing and dressing at the SNF        OT Diagnosis: Generalized weakness;Cognitive deficits   OT Problem List:     OT Treatment/Interventions:      OT Goals(Current goals can be found in the care plan section) Acute Rehab OT  Goals Patient Stated Goal: to return to [facility] and walk there  OT Frequency:     Barriers to D/C:            Co-evaluation              End of Session Nurse Communication:  (ok to give Ensure)  Activity Tolerance: Treatment limited secondary to medical complications (Comment) (Pt desaturates with minimal exertion to 87%,) Patient left: in bed;with call bell/phone within reach   Time: 1317-1336 OT Time Calculation (min): 19 min Charges:  OT General Charges $OT Visit: 1 Procedure OT Evaluation $Initial OT Evaluation Tier I: 1 Procedure G-Codes:    Malka So 08/17/2014, 2:39 PM  [phone]

## 2014-08-17 NOTE — Progress Notes (Signed)
PCO2 81.9. Dr. Sherral Hammers made aware

## 2014-08-17 NOTE — Progress Notes (Signed)
Pt. Could not hold her sats on cpap. RT switched pt. To bipap and pt. Tolerating better at this time.

## 2014-08-17 NOTE — Care Management Note (Signed)
Case Management Note  Patient Details  Name: Kirsten Flores MRN: [id number] Date of Birth: 1923-12-29  Subjective/Objective:       COPD exacerbation              Action/Plan: SNF  Expected Discharge Date:                  Expected Discharge Plan:     In-House Referral:  Clinical Social Work  Discharge planning Services  CM Consult   Status of Service:  Completed, signed off  Medicare Important Message Given:  Yes Date Medicare IM Given:  08/17/14 Medicare IM give by:  Jonnie Finner RN  Date Additional Medicare IM Given:    Additional Medicare Important Message give by:     If discussed at [place] of Stay Meetings, dates discussed:    Additional Comments: Chart reviewed. CSW following for SNF placement. Transfer to SNF when medically stable.  Erenest Rasher, RN 08/17/2014, 3:57 PM

## 2014-08-17 NOTE — Evaluation (Signed)
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 Pt with drop in sats from 94% to 89% simply with conversation in bed. Pt with declining sats with all bed mobility and unable to attempt further tranfers or mobility at this time due to respiratory status. Pt educated for bil LE HEP, PT purpose and plan and encouraged assist with mobility. Pt states she has back pain from [facility] elevated but she desats with HOB lowered and explained the importance of elevation at this time. Pt will benefit from acute therapy to maximize strength, mobility and transfers to decrease burden of care.     Follow Up Recommendations SNF;Supervision/Assistance - 24 hour    Equipment Recommendations  None recommended by PT    Recommendations for Other Services       Precautions / Restrictions Precautions Precautions: Fall Precaution Comments: watch sats      Mobility  Bed Mobility Overal bed mobility: Needs Assistance Bed Mobility: Rolling Rolling: Max assist         General bed mobility comments: cues and assist with rail to roll right but with decreased sats with limited exertion with sats dropping to 86%, max assist with trendelenburg to scoot to [facility] with sats dropping briefly to 77% with HOB lowered. With return to sitting position sats immediately returned to 87% with slow rise to 94% with cues for breathing technique  Transfers                    Ambulation/Gait                Stairs            Wheelchair Mobility    Modified Rankin (Stroke Patients Only)       Balance                                             Pertinent Vitals/Pain Pain Assessment: 0-10 Pain Score: 4  Pain Location: low back Pain Descriptors / Indicators: Aching Pain Intervention(s): Repositioned  HR 84 BP 132/36    Home Living Family/patient expects to be discharged to:: Skilled nursing facility     Type of Home: House Home Access: Ramped entrance     Home Layout: One level Home Equipment: Environmental consultant - 2 wheels;Tub bench;Bedside commode      Prior Function Level of Independence: Needs assistance   Gait / Transfers Assistance Needed: pt states she has been ambulating with RW and using WC unable to state how far she walks  ADL's / Homemaking Assistance Needed: assist for ADLs but unable to state extent        Hand Dominance        Extremity/Trunk Assessment   Upper Extremity Assessment: Generalized weakness           Lower Extremity Assessment: Generalized weakness (pt unable to bend knees greater than 30 degrees in bed even with assist)         Communication   Communication: HOH  Cognition Arousal/Alertness: Awake/alert Behavior During Therapy: Flat affect Overall  Cognitive Status: No family/caregiver present to determine baseline cognitive functioning                      General Comments      Exercises General Exercises - Lower Extremity Heel Slides: AAROM;Both;5 reps;Supine      Assessment/Plan    PT Assessment Patient needs continued PT services  PT Diagnosis Generalized weakness;Difficulty walking   PT Problem List Decreased strength;Decreased activity tolerance;Decreased mobility;Decreased balance;Decreased range of motion;Cardiopulmonary status limiting activity;Obesity  PT Treatment Interventions DME instruction;Functional mobility training;Therapeutic activities;Therapeutic exercise;Gait training;Patient/family education;Balance training   PT Goals (Current goals can be found in the Care Plan section) Acute Rehab PT Goals Patient  Stated Goal: to return to [facility] and walk there PT Goal Formulation: With patient Time For Goal Achievement: 08/31/14 Potential to Achieve Goals: Fair    Frequency Min 2X/week   Barriers to discharge Decreased caregiver support      Co-evaluation               End of Session Equipment Utilized During Treatment: Oxygen Activity Tolerance: Patient limited by fatigue Patient left: in bed;with call bell/phone within reach Nurse Communication: Mobility status;Need for lift equipment         Time: 1610-9604 PT Time Calculation (min) (ACUTE ONLY): 19 min   Charges:   PT Evaluation $Initial PT Evaluation Tier I: 1 Procedure     PT G Codes:        Kirsten Flores 08/17/2014, 11:30 AM Elwyn Reach, [place]

## 2014-08-17 NOTE — Progress Notes (Signed)
SLP Cancellation Note  Patient Details Name: Kirsten Flores MRN: [id number] DOB: 04/18/1923   Cancelled treatment:       Reason Eval/Treat Not Completed: Medical issues which prohibited therapy. Pt did not tolerate cpap last night; currently on BiPAP. Will f/u for diet check when medically appropriate.    Kern Reap, [place], CCC-SLP 08/17/2014, 10:01 AM  [phone]

## 2014-08-17 NOTE — Progress Notes (Signed)
CRITICAL VALUE ALERT  Critical value received PCO2 81.9  Date of notification:  08/17/2014  Time of notification:  5102  Critical value read back:Yes.    Nurse who received alert:  Marta Lamas RN  MD notified (1st page):  Dr. Sherral Hammers  Time of first page:  1850  MD notified (2nd page):  Time of second page:  Responding MD:  Dr. Sherral Hammers  Time MD responded:  [phone]

## 2014-08-17 NOTE — Progress Notes (Signed)
[place] TEAM 1 - Stepdown/ICU TEAM Progress Note  Kirsten Flores LOV:[id number] DOB: 05/07/1923 DOA: 08/13/2014 PCP: Garnet Koyanagi, DO  Admit HPI / Brief Narrative: Kirsten Flores is a 81 y.o. WF PMHx dyslipidemia, hypertension, hypothyroidism, morbid obesity, COPD on 4 L O2 at home per patient, OSA/OHS on CPAP at night.  Presents with complaints of sudden onset of shortness of breath which is progressively worsening associated with cough. The patient was recently hospitalized for possible COPD exacerbation with acute hypercarbia. The patient was discharged to nursing home. The nursing home the patient was placed on 2 L of nasal cannula as well as tapering dose of prednisone and currently taking 10 mg right now. As per discussion based on documentation available, the son felt that the patient has been using 4 L of oxygen at home and they were in the process of arranging that at the nursing home. There was no fall no trauma reported. No fever no chills reported no nausea no vomiting. No diarrhea. Patient has been more drowsy and gradually has been worsening and later on on routine evaluation she was found to be saturating 50% on her 2 L of nasal cannula and therefore was brought here. Initially the patient was confused and lethargic and was not responding to questions but later on the time of my evaluation after being on 2 hours of BiPAP she feels significantly better and is able to provide history.  The patient is coming from home. And at her baseline independent for most of her ADL.  HPI/Subjective: 6/17 lethargic, now on BiPAP, with a RR 30+.  Assessment/Plan: Acute and chronic respiratory failure (acute-on-chronic) with hypercapnia -Most likely multifactorial to include OSA/OHS, polypharmacy, COPD exacerbation and incorrect SNF O2 regimen. - Acute change in patient's respiratory status, now on BiPAP to maintain saturation. -Ordered CT chest PE protocol  -Obtain EKG, and cardiac  enzymes; MI?  Mixed Respiratory acidosis/Metabolic acidosis -See acute and chronic respiratory failure  OSA/OHS -Continue CPAP or BiPAP PRN to maintain SPO2 89-93%  COPD exacerbation -Continue current antibiotics for 7 day course -Continue prednisone 40 mg daily -Continue DuoNeb to PRN -Flutter valve -Mucinex DM BID -6/14 Legionella urinary antigen negative -6/14 strep pneumo urine antigen pending  Elevated BNP, chronic diastolic heart failure. -Asymptomatic continued mildly elevated troponin  -Likely related to respiratory distress - thus far has peaked at 0.1  Pulmonary hypertension -BP appropriate for a 81 year old would not start new medication at this time unless patient became symptomatic.  Suspected polypharmacy. -Continue gabapentin dose 300 mg at bedtime. -Hold all other sedating medication   Hypothyroidism. -Continue levothyroxine 125 g daily -TSH. Within normal limit  Hypernatremia - Continue 0.45% saline at 59ml/hr   Hypokalemia -Potassium Goal > 4 -At goal   Advance goals of care discussion: DNR/DNI as per documentation available.     Code Status: FULL Family Communication: Son present at time of exam Disposition Plan: SNF    Consultants:   Procedure/Significant Events: 5/23 echocardiogram;Left ventricle: moderate concentric hypertrophy.-LVEF= 65% to 70%.  - (grade 1 diastolic dysfunction). - Aortic valve: Trileaflet; moderately thickened, moderately calcified leaflets.  - Left atrium: moderately dilated. - Tricuspid valve:  moderate regurgitation. - Pulmonary arteries:PA peak pressure: 59 mm Hg (S).    Culture 6/13 blood right arm/hand NGTD 6/14 Legionella urinary antigen native 6/14 strep pneumo urine antigen pending 6/14 sputum culture not collected   Antibiotics: Zosyn 6/13>> Vancomycin 6/13>>  DVT prophylaxis: Lovenox    Devices    LINES / TUBES:  Continuous Infusions: . sodium chloride 60 mL/hr at  08/17/14 1047    Objective: VITAL SIGNS: Temp: 98.1 F (36.7 C) (06/17 1600) Temp Source: Oral (06/17 1600) BP: 142/45 mmHg (06/17 1600) Pulse Rate: 84 (06/17 1600) SPO2; FIO2:   Intake/Output Summary (Last 24 hours) at 08/17/14 1742 Last data filed at 08/17/14 0600  Gross per 24 hour  Intake   1330 ml  Output      0 ml  Net   1330 ml     Exam: General: Lethargic but arousable A/O 4, positive acute respiratory distress, multiple skin lesions on head and bilateral lower extremity (has been addressed by dermatologist) Eyes: Negative headache, negative retinal hemorrhage ENT: Negative Runny nose, negative ear pain, negative tinnitus, negative gingival bleeding Neck:  Negative scars, masses, torticollis, lymphadenopathy, JVD Lungs: tachypnea, decreased air movement diffusely,  without wheezes or crackles Cardiovascular: Tachycardic Regular rhythm positive systolic murmur 3/6, negative gallop or rub normal S1 and S2 Abdomen: Morbidly obese, negative abdominal pain, negative dysphagia, Nontender, nondistended, soft, bowel sounds positive, no rebound, no ascites, no appreciable mass Extremities: No significant cyanosis, clubbing, or edema bilateral lower extremities, multiple skin lesions bilateral lower extremity Psychiatric:  Negative depression, negative anxiety, negative fatigue, negative mania  Neurologic:  Cranial nerves II through XII intact, tongue/uvula midline, all extremities muscle strength 5/5, sensation intact negative dysarthria, negative expressive aphasia, negative receptive aphasia.     Data Reviewed: Basic Metabolic Panel:  Recent Labs Lab 08/13/14 2007 08/14/14 0800 08/16/14 0313 08/17/14 0227  NA 149* 150* 146* 144  K 3.8 3.6 3.2* 4.0  CL 99* 102 99* 101  CO2 42* 39* 41* 40*  GLUCOSE 122* 173* 84 99  BUN 15 15 13 11   CREATININE 0.73 0.72 0.58 0.58  CALCIUM 9.5 9.4 8.7* 8.7*  MG  --   --   --  1.8   Liver Function Tests:  Recent Labs Lab  08/14/14 0800 08/16/14 0313 08/17/14 0227  AST 35 26 24  ALT 25 20 19   ALKPHOS 37* 36* 31*  BILITOT 1.6* 1.1 1.0  PROT 4.7* 4.5* 4.4*  ALBUMIN 2.5* 2.1* 2.2*   No results for input(s): LIPASE, AMYLASE in the last 168 hours. No results for input(s): AMMONIA in the last 168 hours. CBC:  Recent Labs Lab 08/13/14 2007 08/14/14 0800 08/16/14 0313 08/17/14 0227  WBC 4.7 6.0 3.2* 2.8*  NEUTROABS 3.2 5.5  --  1.9  HGB 11.4* 10.9* 9.7* 9.6*  HCT 39.2 36.2 32.0* 31.1*  MCV 109.2* 105.2* 103.9* 104.4*  PLT 131* 125* 103* 117*   Cardiac Enzymes:  Recent Labs Lab 08/14/14 1245 08/14/14 2015 08/15/14 0043 08/15/14 0650 08/16/14 0313  TROPONINI 0.08* 0.08* 0.08* 0.10* 0.08*   BNP (last 3 results)  Recent Labs  07/11/14 1525 07/22/14 1810 08/13/14 2007  BNP 282.5* 715.8* 454.8*    ProBNP (last 3 results) No results for input(s): PROBNP in the last 8760 hours.  CBG: No results for input(s): GLUCAP in the last 168 hours.  Recent Results (from the past 240 hour(s))  Blood culture (routine x 2)     Status: None (Preliminary result)   Collection Time: 08/13/14  7:56 PM  Result Value Ref Range Status   Specimen Description BLOOD RIGHT ARM  Final   Special Requests   Final    BOTTLES DRAWN AEROBIC AND ANAEROBIC 10CC BLUE, 5CC RED   Culture   Final           BLOOD CULTURE RECEIVED NO GROWTH TO  DATE CULTURE WILL BE HELD FOR 5 DAYS BEFORE ISSUING A FINAL NEGATIVE REPORT Performed at [facility]    Report Status PENDING  Incomplete  Blood culture (routine x 2)     Status: None (Preliminary result)   Collection Time: 08/13/14  8:09 PM  Result Value Ref Range Status   Specimen Description BLOOD RIGHT HAND  Final   Special Requests BOTTLES DRAWN AEROBIC ONLY 5CC  Final   Culture   Final           BLOOD CULTURE RECEIVED NO GROWTH TO DATE CULTURE WILL BE HELD FOR 5 DAYS BEFORE ISSUING A FINAL NEGATIVE REPORT Performed at [facility]    Report Status  PENDING  Incomplete     Studies:  Recent x-ray studies have been reviewed in detail by the Attending Physician  Scheduled Meds:  Scheduled Meds: . antiseptic oral rinse  7 mL Mouth Rinse q12n4p  . atorvastatin  10 mg Oral Daily  . chlorhexidine  15 mL Mouth Rinse BID  . clopidogrel  75 mg Oral Q breakfast  . dextromethorphan-guaiFENesin  1 tablet Oral BID  . enoxaparin (LOVENOX) injection  40 mg Subcutaneous Q24H  . feeding supplement (ENSURE ENLIVE)  237 mL Oral BID BM  . gabapentin  300 mg Oral QHS  . levothyroxine  125 mcg Oral QAC breakfast  . pantoprazole  40 mg Oral Daily  . piperacillin-tazobactam (ZOSYN)  IV  3.375 g Intravenous 3 times per day  . predniSONE  40 mg Oral Q breakfast  . sertraline  50 mg Oral Daily  . vancomycin  1,250 mg Intravenous Q24H    Time spent on care of this patient: 40 mins   Ema Hebner, Geraldo Docker , MD  [facility] Office  [phone] Pager [phone]  On-Call/Text Page:      [email or web address]      password [email or web address]  If 7PM-7AM, please contact night-coverage [email or web address] Password [email or web address] 08/17/2014, 5:42 PM   LOS: 4 days   Care during the described time interval was provided by me .  I have reviewed this patient's available data, including medical history, events of note, physical examination, and all test results as part of my evaluation. I have personally reviewed and interpreted all radiology studies.   Dia Crawford, MD [phone] Pager

## 2014-08-17 NOTE — Progress Notes (Signed)
[place] for vancomycin and zosyn Indication: pneumonia  Allergies  Allergen Reactions  . Diltiazem Hcl Other (See Comments)    Unknown reaction  . Neomycin Other (See Comments)    Unknown reaction (a long time ago)    Assessment: 81 yo female admitted with SOB and hypoxia. Had a recent admission for acute on chronic respiratory failure. Started antibiotics for possible pneumonia.    Afebrile, wbc low at 3.2, scr normal.  Vancomycin trough therapeutic  6/13 vanc > 6/13 zosyn >  6/13 blood WG:YKZL Goal of Therapy:  Vancomycin trough level 15-20 mcg/ml  Plan:  Vancomycin 1250 mg IV q24h -Day # 5 (consider stopping?) Zosyn 3.375 g IV q8h Monitor renal fx, cultures, VT as needed      Patient Measurements: Height: 5\' 5"  (165.1 cm) Weight: 216 lb 14.9 oz (98.4 kg) IBW/kg (Calculated) : 57 Adjusted Body Weight:   Vital Signs: Temp: 98.1 F (36.7 C) (06/17 1600) Temp Source: Oral (06/17 1600) BP: 142/45 mmHg (06/17 1600) Pulse Rate: 84 (06/17 1600) Intake/Output from previous day: 06/16 0701 - 06/17 0700 In: 2317 [P.O.:537; I.V.:1380; IV Piggyback:400] Out: -  Intake/Output from this shift:    Labs:  Recent Labs  08/16/14 0313 08/17/14 0227  WBC 3.2* 2.8*  HGB 9.7* 9.6*  PLT 103* 117*  CREATININE 0.58 0.58   Estimated Creatinine Clearance: 55.4 mL/min (by C-G formula based on Cr of 0.58).  Recent Labs  08/17/14 [place] 12      Thank you Anette Guarneri, PharmD [phone]  08/17/2014 8:01 PM

## 2014-08-18 ENCOUNTER — Inpatient Hospital Stay (HOSPITAL_COMMUNITY): Payer: Medicare Other

## 2014-08-18 DIAGNOSIS — E662 Morbid (severe) obesity with alveolar hypoventilation: Secondary | ICD-10-CM | POA: Diagnosis present

## 2014-08-18 DIAGNOSIS — E8729 Other acidosis: Secondary | ICD-10-CM | POA: Diagnosis present

## 2014-08-18 DIAGNOSIS — E872 Acidosis: Secondary | ICD-10-CM | POA: Diagnosis present

## 2014-08-18 DIAGNOSIS — J90 Pleural effusion, not elsewhere classified: Secondary | ICD-10-CM | POA: Diagnosis present

## 2014-08-18 LAB — COMPREHENSIVE METABOLIC PANEL
ALBUMIN: 2.2 g/dL — AB (ref 3.5–5.0)
ALK PHOS: 36 U/L — AB (ref 38–126)
ALT: 22 U/L (ref 14–54)
AST: 27 U/L (ref 15–41)
Anion gap: 5 (ref 5–15)
BILIRUBIN TOTAL: 0.8 mg/dL (ref 0.3–1.2)
BUN: 10 mg/dL (ref 6–20)
CHLORIDE: 99 mmol/L — AB (ref 101–111)
CO2: 39 mmol/L — ABNORMAL HIGH (ref 22–32)
Calcium: 8.9 mg/dL (ref 8.9–10.3)
Creatinine, Ser: 0.62 mg/dL (ref 0.44–1.00)
GFR calc Af Amer: >60 mL/min (ref >60)
GFR calc non Af Amer: >60 mL/min (ref >60)
Glucose, Bld: 83 mg/dL (ref 65–99)
POTASSIUM: 3.5 mmol/L (ref 3.5–5.1)
Sodium: 143 mmol/L (ref 135–145)
Total Protein: 4.6 g/dL — ABNORMAL LOW (ref 6.5–8.1)

## 2014-08-18 LAB — CBC WITH DIFFERENTIAL/PLATELET
Basophils Absolute: 0 10*3/uL (ref 0.0–0.1)
Basophils Relative: 0 % (ref 0–1)
EOS ABS: 0.1 10*3/uL (ref 0.0–0.7)
Eosinophils Relative: 2 % (ref 0–5)
HCT: 32 % — ABNORMAL LOW (ref 36.0–46.0)
Hemoglobin: 9.8 g/dL — ABNORMAL LOW (ref 12.0–15.0)
LYMPHS PCT: 27 % (ref 12–46)
Lymphs Abs: 0.7 10*3/uL (ref 0.7–4.0)
MCH: 31.7 pg (ref 26.0–34.0)
MCHC: 30.6 g/dL (ref 30.0–36.0)
MCV: 103.6 fL — ABNORMAL HIGH (ref 78.0–100.0)
Monocytes Absolute: 0.3 10*3/uL (ref 0.1–1.0)
Monocytes Relative: 10 % (ref 3–12)
Neutro Abs: 1.7 10*3/uL (ref 1.7–7.7)
Neutrophils Relative %: 61 % (ref 43–77)
PLATELETS: 127 10*3/uL — AB (ref 150–400)
RBC: 3.09 MIL/uL — AB (ref 3.87–5.11)
RDW: 13.8 % (ref 11.5–15.5)
WBC: 2.7 10*3/uL — ABNORMAL LOW (ref 4.0–10.5)

## 2014-08-18 LAB — MAGNESIUM: Magnesium: 1.9 mg/dL (ref 1.7–2.4)

## 2014-08-18 LAB — TROPONIN I
TROPONIN I: 0.06 ng/mL — AB (ref <0.031)
Troponin I: 0.06 ng/mL — ABNORMAL HIGH (ref <0.031)

## 2014-08-18 MED ORDER — FUROSEMIDE 10 MG/ML IJ SOLN
40.0000 mg | Freq: Every day | INTRAMUSCULAR | Status: DC
  Administered 2014-08-18 – 2014-08-23 (×6): 40 mg via INTRAVENOUS
  Filled 2014-08-18 (×5): qty 4

## 2014-08-18 NOTE — Progress Notes (Signed)
MBS ordered in error, MD notified.  Arvil Chaco MA, [facility] Speech Language Pathologist

## 2014-08-18 NOTE — Progress Notes (Signed)
MD aware of increasing blood pressure.

## 2014-08-18 NOTE — Progress Notes (Signed)
Pt off bipap 

## 2014-08-18 NOTE — Progress Notes (Signed)
[place] TEAM 1 - Stepdown/ICU TEAM Progress Note  Kirsten Flores DOB: 23-Mar-1923 DOA: 08/13/2014 PCP: Kirsten Koyanagi, DO  Admit HPI / Brief Narrative: Kirsten Flores is a 81 y.o. WF PMHx dyslipidemia, hypertension, hypothyroidism, morbid obesity, COPD on 4 L O2 at home per patient, OSA/OHS on CPAP at night.  Presents with complaints of sudden onset of shortness of breath which is progressively worsening associated with cough. The patient was recently hospitalized for possible COPD exacerbation with acute hypercarbia. The patient was discharged to nursing home. The nursing home the patient was placed on 2 L of nasal cannula as well as tapering dose of prednisone and currently taking 10 mg right now. As per discussion based on documentation available, the son felt that the patient has been using 4 L of oxygen at home and they were in the process of arranging that at the nursing home. There was no fall no trauma reported. No fever no chills reported no nausea no vomiting. No diarrhea. Patient has been more drowsy and gradually has been worsening and later on on routine evaluation she was found to be saturating 50% on her 2 L of nasal cannula and therefore was brought here. Initially the patient was confused and lethargic and was not responding to questions but later on the time of my evaluation after being on 2 hours of BiPAP she feels significantly better and is able to provide history.  The patient is coming from home. And at her baseline independent for most of her ADL.  HPI/Subjective: 6/18 patient's respiratory status has improved, however still requiring BiPAP when asleep. This is a change from her baseline of only 4 L O2 via [place] 24 hours a day.  Assessment/Plan: Acute and chronic respiratory failure (acute-on-chronic) with hypercapnia -Most likely multifactorial to include OSA/OHS, polypharmacy, COPD exacerbation and incorrect SNF O2 regimen. - 6/18 patient's acute  respiratory status change from yesterday secondary to bilateral pleural effusion. -KVO IV fluids, start gentle diuresis Lasix 40 mg daily  -Counseled patient that if we could not reduce her effusions in the next couple of days would perform thoracentesis. Patient would like to discuss procedure with her son before making her decision. -Troponins elevated but stable from admission.   Mixed Respiratory acidosis/Metabolic acidosis -See acute and chronic respiratory failure  OSA/OHS -Continue CPAP or BiPAP PRN to maintain SPO2 89-93%  COPD exacerbation -Continue current antibiotics for 7 day course -Continue prednisone 40 mg daily -Continue DuoNeb to PRN -Flutter valve -Mucinex DM BID -6/14 Legionella urinary antigen negative -6/14 strep pneumo urine antigen negative  Pleural effusion with bilateral -Patient with bilateral pleural effusion Rt>> Lt  Elevated BNP, chronic diastolic heart failure. -Asymptomatic continued mildly elevated troponin  -Likely related to respiratory distress - thus far has peaked at 0.1 - Strict in and Out; Since admission + 8.8 L  - Lasix IV 40 mg Daily  Pulmonary hypertension -BP appropriate for a 81 year old would not start new medication at this time unless patient became symptomatic.  Suspected polypharmacy. -Continue gabapentin dose 300 mg at bedtime. -Hold all other sedating medication   Hypothyroidism. -Continue levothyroxine 125 g daily -TSH. Within normal limit  Hypernatremia - KVO 0.45% saline    Hypokalemia -Potassium Goal > 4 -At goal   Advance goals of care discussion: DNR/DNI as per documentation available.     Code Status: FULL Family Communication: No family present at time of exam Disposition Plan: SNF    Consultants:   Procedure/Significant Events: 5/23 echocardiogram;Left ventricle:  moderate concentric hypertrophy.-LVEF= 65% to 70%.  - (grade 1 diastolic dysfunction). - Aortic valve: Trileaflet; moderately  thickened, moderately calcified leaflets.  - Left atrium: moderately dilated. - Tricuspid valve:  moderate regurgitation. - Pulmonary arteries:PA peak pressure: 59 mm Hg (S).    Culture 6/13 blood right arm/hand NGTD 6/14 Legionella urinary antigen native 6/14 strep pneumo urine antigen negative  6/14 sputum culture not collected   Antibiotics: Zosyn 6/13>> Vancomycin 6/13>>  DVT prophylaxis: Lovenox    Devices    LINES / TUBES:      Continuous Infusions: . sodium chloride 10 mL/hr at 08/18/14 1542    Objective: VITAL SIGNS: Temp: 98.2 F (36.8 C) (06/18 1600) Temp Source: Oral (06/18 1600) BP: 161/63 mmHg (06/18 1600) Pulse Rate: 81 (06/18 1600) SPO2; FIO2:   Intake/Output Summary (Last 24 hours) at 08/18/14 1739 Last data filed at 08/18/14 1300  Gross per 24 hour  Intake   2910 ml  Output      0 ml  Net   2910 ml     Exam: General: A/O 4, back to baseline (joking with staff), multiple skin lesions on head and bilateral lower extremity (has been addressed by dermatologist) Eyes: Negative headache, negative retinal hemorrhage ENT: Negative Runny nose, negative ear pain, negative tinnitus, negative gingival bleeding Neck:  Negative scars, masses, torticollis, lymphadenopathy, JVD Lungs: decreased air movement RML/RLL/LLL, without wheezes or crackles Cardiovascular: Regular rhythm and rate, positive systolic murmur 3/6, negative gallop or rub normal S1 and S2 Abdomen: Morbidly obese, negative abdominal pain, negative dysphagia, Nontender, nondistended, soft, bowel sounds positive, no rebound, no ascites, no appreciable mass Extremities: No significant cyanosis, clubbing, or edema bilateral lower extremities, multiple skin lesions bilateral lower extremity, bilateral pitting edema 2+ Psychiatric:  Negative depression, negative anxiety, negative fatigue, negative mania  Neurologic:  Cranial nerves II through XII intact, tongue/uvula midline, all  extremities muscle strength 5/5, sensation intact negative dysarthria, negative expressive aphasia, negative receptive aphasia.     Data Reviewed: Basic Metabolic Panel:  Recent Labs Lab 08/14/14 0800 08/16/14 0313 08/17/14 0227 08/17/14 1923 08/18/14 0557  NA 150* 146* 144 141 143  K 3.6 3.2* 4.0 4.2 3.5  CL 102 99* 101 101 99*  CO2 39* 41* 40* 32 39*  GLUCOSE 173* 84 99 152* 83  BUN 15 13 11 13 10   CREATININE 0.72 0.58 0.58 0.66 0.62  CALCIUM 9.4 8.7* 8.7* 8.8* 8.9  MG  --   --  1.8  --  1.9   Liver Function Tests:  Recent Labs Lab 08/14/14 0800 08/16/14 0313 08/17/14 0227 08/17/14 1923 08/18/14 0557  AST 35 26 24 34 27  ALT 25 20 19 24 22   ALKPHOS 37* 36* 31* 37* 36*  BILITOT 1.6* 1.1 1.0 0.7 0.8  PROT 4.7* 4.5* 4.4* 4.6* 4.6*  ALBUMIN 2.5* 2.1* 2.2* 2.3* 2.2*   No results for input(s): LIPASE, AMYLASE in the last 168 hours. No results for input(s): AMMONIA in the last 168 hours. CBC:  Recent Labs Lab 08/13/14 2007 08/14/14 0800 08/16/14 0313 08/17/14 0227 08/18/14 0557  WBC 4.7 6.0 3.2* 2.8* 2.7*  NEUTROABS 3.2 5.5  --  1.9 1.7  HGB 11.4* 10.9* 9.7* 9.6* 9.8*  HCT 39.2 36.2 32.0* 31.1* 32.0*  MCV 109.2* 105.2* 103.9* 104.4* 103.6*  PLT 131* 125* 103* 117* 127*   Cardiac Enzymes:  Recent Labs Lab 08/15/14 0650 08/16/14 0313 08/17/14 1923 08/18/14 0029 08/18/14 0557  TROPONINI 0.10* 0.08* 0.05* 0.06* 0.06*   BNP (last 3  results)  Recent Labs  07/11/14 1525 07/22/14 1810 08/13/14 2007  BNP 282.5* 715.8* 454.8*    ProBNP (last 3 results) No results for input(s): PROBNP in the last 8760 hours.  CBG: No results for input(s): GLUCAP in the last 168 hours.  Recent Results (from the past 240 hour(s))  Blood culture (routine x 2)     Status: None (Preliminary result)   Collection Time: 08/13/14  7:56 PM  Result Value Ref Range Status   Specimen Description BLOOD RIGHT ARM  Final   Special Requests   Final    BOTTLES DRAWN AEROBIC  AND ANAEROBIC 10CC BLUE, 5CC RED   Culture   Final           BLOOD CULTURE RECEIVED NO GROWTH TO DATE CULTURE WILL BE HELD FOR 5 DAYS BEFORE ISSUING A FINAL NEGATIVE REPORT Performed at [facility]    Report Status PENDING  Incomplete  Blood culture (routine x 2)     Status: None (Preliminary result)   Collection Time: 08/13/14  8:09 PM  Result Value Ref Range Status   Specimen Description BLOOD RIGHT HAND  Final   Special Requests BOTTLES DRAWN AEROBIC ONLY 5CC  Final   Culture   Final           BLOOD CULTURE RECEIVED NO GROWTH TO DATE CULTURE WILL BE HELD FOR 5 DAYS BEFORE ISSUING A FINAL NEGATIVE REPORT Performed at [facility]    Report Status PENDING  Incomplete     Studies:  Recent x-ray studies have been reviewed in detail by the Attending Physician  Scheduled Meds:  Scheduled Meds: . antiseptic oral rinse  7 mL Mouth Rinse q12n4p  . atorvastatin  10 mg Oral Daily  . chlorhexidine  15 mL Mouth Rinse BID  . clopidogrel  75 mg Oral Q breakfast  . dextromethorphan-guaiFENesin  1 tablet Oral BID  . enoxaparin (LOVENOX) injection  40 mg Subcutaneous Q24H  . feeding supplement (ENSURE ENLIVE)  237 mL Oral BID BM  . furosemide  40 mg Intravenous Daily  . gabapentin  300 mg Oral QHS  . levothyroxine  125 mcg Oral QAC breakfast  . pantoprazole  40 mg Oral Daily  . piperacillin-tazobactam (ZOSYN)  IV  3.375 g Intravenous 3 times per day  . predniSONE  40 mg Oral Q breakfast  . sertraline  50 mg Oral Daily  . vancomycin  1,250 mg Intravenous Q24H    Time spent on care of this patient: 40 mins   WOODS, Geraldo Docker , MD  [facility] Office  [phone] Pager [phone]  On-Call/Text Page:      [email or web address]      password [email or web address]  If 7PM-7AM, please contact night-coverage [email or web address] Password [email or web address] 08/18/2014, 5:39 PM   LOS: 5 days   Care during the described time interval was provided by me .  I have reviewed this patient's available data,  including medical history, events of note, physical examination, and all test results as part of my evaluation. I have personally reviewed and interpreted all radiology studies.   Dia Crawford, MD [phone] Pager

## 2014-08-19 LAB — COMPREHENSIVE METABOLIC PANEL
ALBUMIN: 2.2 g/dL — AB (ref 3.5–5.0)
ALT: 23 U/L (ref 14–54)
AST: 28 U/L (ref 15–41)
Alkaline Phosphatase: 37 U/L — ABNORMAL LOW (ref 38–126)
Anion gap: 5 (ref 5–15)
BUN: 9 mg/dL (ref 6–20)
CO2: 42 mmol/L — ABNORMAL HIGH (ref 22–32)
CREATININE: 0.55 mg/dL (ref 0.44–1.00)
Calcium: 9 mg/dL (ref 8.9–10.3)
Chloride: 97 mmol/L — ABNORMAL LOW (ref 101–111)
GFR calc Af Amer: >60 mL/min (ref >60)
GFR calc non Af Amer: >60 mL/min (ref >60)
GLUCOSE: 95 mg/dL (ref 65–99)
Potassium: 3.4 mmol/L — ABNORMAL LOW (ref 3.5–5.1)
Sodium: 144 mmol/L (ref 135–145)
Total Bilirubin: 0.8 mg/dL (ref 0.3–1.2)
Total Protein: 4.4 g/dL — ABNORMAL LOW (ref 6.5–8.1)

## 2014-08-19 LAB — CBC WITH DIFFERENTIAL/PLATELET
BASOS PCT: 0 % (ref 0–1)
Basophils Absolute: 0 10*3/uL (ref 0.0–0.1)
EOS ABS: 0.1 10*3/uL (ref 0.0–0.7)
EOS PCT: 3 % (ref 0–5)
HEMATOCRIT: 33.4 % — AB (ref 36.0–46.0)
HEMOGLOBIN: 10.1 g/dL — AB (ref 12.0–15.0)
LYMPHS PCT: 16 % (ref 12–46)
Lymphs Abs: 0.4 10*3/uL — ABNORMAL LOW (ref 0.7–4.0)
MCH: 31.5 pg (ref 26.0–34.0)
MCHC: 30.2 g/dL (ref 30.0–36.0)
MCV: 104 fL — ABNORMAL HIGH (ref 78.0–100.0)
MONOS PCT: 10 % (ref 3–12)
Monocytes Absolute: 0.3 10*3/uL (ref 0.1–1.0)
NEUTROS ABS: 1.8 10*3/uL (ref 1.7–7.7)
NEUTROS PCT: 71 % (ref 43–77)
PLATELETS: 126 10*3/uL — AB (ref 150–400)
RBC: 3.21 MIL/uL — AB (ref 3.87–5.11)
RDW: 13.2 % (ref 11.5–15.5)
WBC: 2.6 10*3/uL — ABNORMAL LOW (ref 4.0–10.5)

## 2014-08-19 LAB — MAGNESIUM: Magnesium: 1.8 mg/dL (ref 1.7–2.4)

## 2014-08-19 MED ORDER — CETYLPYRIDINIUM CHLORIDE 0.05 % MT LIQD
7.0000 mL | Freq: Two times a day (BID) | OROMUCOSAL | Status: DC
  Administered 2014-08-19 – 2014-08-26 (×12): 7 mL via OROMUCOSAL

## 2014-08-19 MED ORDER — POTASSIUM CHLORIDE CRYS ER 20 MEQ PO TBCR
40.0000 meq | EXTENDED_RELEASE_TABLET | Freq: Once | ORAL | Status: AC
  Administered 2014-08-19: 40 meq via ORAL
  Filled 2014-08-19: qty 2

## 2014-08-19 NOTE — Progress Notes (Signed)
Patient had cpap placed by RT and patient removed and refused to let  Off going nurse or on coming nurse reapply. )2 sats 89-90 on 02 at [place] per [place] RN

## 2014-08-19 NOTE — Clinical Social Work Note (Signed)
Clinical Social Work Assessment  Patient Details  Name: Kirsten Flores MRN: [id number] Date of Birth: 05-09-1923  Date of referral:  08/19/14               Reason for consult:  Discharge Planning, Facility Placement                Permission sought to share information with:  Family Supports, Chartered certified accountant granted to share information::  Yes, Verbal Permission Granted  Name::     Surveyor, quantity::  [employer]  Relationship::     Contact Information:     Housing/Transportation Living arrangements for the past 2 months:  [place] of Information:  Patient Patient Interpreter Needed:  None Criminal Activity/Legal Involvement Pertinent to Current Situation/Hospitalization:  No - Comment as needed Significant Relationships:  Adult Children Lives with:  Self Do you feel safe going back to the place where you live?  Yes Need for family participation in patient care:  Yes (Comment)  Care giving concerns:  No concerns reported by patient, she does ask for CSW to contact her son Kirsten Flores tomorrow.   Social Worker assessment / plan:  Patient was admitted from [facility] SNF. She states that she plans to return to the facility once medically stable. She says she was at the facility PTA for short term rehab. CSW explained role and process of getting her back to [facility] once discharged. She states her main support is her son Kirsten Flores who is a Radiographer, therapeutic. She asks that CSW contact the son Kirsten Flores tomorrow, as he is very busy today with Father's Day. CSW also provided patient with unit CSW number so that the son could contact us as she states he works very odd hours.   Employment status:  Disabled (Comment on whether or not currently receiving Disability), Retired Nurse, adult PT Recommendations:  [place] / Referral to community resources:  [place]  Patient/Family's Response to  care:  Patient appears to be happy with her care, no complaints.  Patient/Family's Understanding of and Emotional Response to Diagnosis, Current Treatment, and Prognosis:  Patient appears to have good insight into reason for admission and understands that she will require increased assistance at discharge, she is agreeable to placement. Patient becomes short of breath just from talking.  Emotional Assessment Appearance:  Appears stated age Attitude/Demeanor/Rapport:  Other (Pleasant) Affect (typically observed):  Accepting, Calm, Appropriate Orientation:  Oriented to Self, Oriented to Place, Oriented to  Time, Oriented to Situation Alcohol / Substance use:  Alcohol Use, Tobacco Use (Hx of tobacco use, current alcohol use.) Psych involvement (Current and /or in the community):  No (Comment)  Discharge Needs  Concerns to be addressed:  Discharge Planning Concerns Readmission within the last 30 days:  Yes Current discharge risk:  Physical Impairment Barriers to Discharge:  Continued Medical Work up   [employer] MSW, [place], [place], [phone]

## 2014-08-19 NOTE — Progress Notes (Signed)
[place] TEAM 1 - Stepdown/ICU TEAM Progress Note  Kirsten Flores VHQ:[id number] DOB: 1923/12/26 DOA: 08/13/2014 PCP: Garnet Koyanagi, DO  Admit HPI / Brief Narrative: Kirsten Flores is a 81 y.o. WF PMHx dyslipidemia, hypertension, hypothyroidism, morbid obesity, COPD on 4 L O2 at home per patient, OSA/OHS on CPAP at night.  Presents with complaints of sudden onset of shortness of breath which is progressively worsening associated with cough. The patient was recently hospitalized for possible COPD exacerbation with acute hypercarbia. The patient was discharged to nursing home. The nursing home the patient was placed on 2 L of nasal cannula as well as tapering dose of prednisone and currently taking 10 mg right now. As per discussion based on documentation available, the son felt that the patient has been using 4 L of oxygen at home and they were in the process of arranging that at the nursing home. There was no fall no trauma reported. No fever no chills reported no nausea no vomiting. No diarrhea. Patient has been more drowsy and gradually has been worsening and later on on routine evaluation she was found to be saturating 50% on her 2 L of nasal cannula and therefore was brought here. Initially the patient was confused and lethargic and was not responding to questions but later on the time of my evaluation after being on 2 hours of BiPAP she feels significantly better and is able to provide history.  The patient is coming from home. And at her baseline independent for most of her ADL.  HPI/Subjective: 6/19  patient's respiratory status continues to improve, only used  CPAP part of the night. Currently on 5 L O2 via [place] sitting in chair comfortably (home regimen is 4 L O2 via [place]), able to hold conversation without distress.   Assessment/Plan: Acute and chronic respiratory failure (acute-on-chronic) with hypercapnia -Most likely multifactorial to include OSA/OHS, polypharmacy, COPD  exacerbation and incorrect SNF O2 regimen. - 6/18 patient's acute respiratory status change from yesterday secondary to bilateral pleural effusion.  -Counseled patient that if we could not reduce her effusions in the next couple of days would perform thoracentesis. Patient would like to discuss procedure with her son before making her decision. -Troponins elevated but turning down  Mixed Respiratory acidosis/Metabolic acidosis -See acute and chronic respiratory failure -See pleural effusion  OSA/OHS -Continue CPAP or BiPAP PRN to maintain SPO2 89-93%  COPD exacerbation -Continue current antibiotics for 7 day course -Continue prednisone 40 mg daily -Continue DuoNeb to PRN -Flutter valve -Mucinex DM BID -6/14 Legionella urinary antigen negative -6/14 strep pneumo urine antigen negative  Pleural effusion with bilateral -Patient with bilateral pleural effusion Rt>> Lt -PCXR in Am check interval change; If no improvement by Tuesday Thoracentesis if Pt allows  -Continue Lasix 40 mg daily   Elevated BNP, chronic diastolic heart failure. -Asymptomatic continued mildly elevated troponin  -Likely related to respiratory distress - thus far has peaked at 0.1 - Strict in and Out; Since admission + 9.8 L (accuracy?)  - Lasix IV 40 mg Daily  Pulmonary hypertension -BP appropriate for a 81 year old would not start new medication at this time unless patient became symptomatic.  Suspected polypharmacy. -Continue gabapentin dose 300 mg at bedtime. -Hold all other sedating medication   Hypothyroidism. -Continue levothyroxine 125 g daily -TSH. Within normal limit  Hypernatremia - KVO 0.45% saline    Hypokalemia -Potassium Goal > 4 -K-Dur 40 mEq 1      Code Status: FULL Family Communication: No family present at  time of exam Disposition Plan: SNF    Consultants:   Procedure/Significant Events: 5/23 echocardiogram;Left ventricle: moderate concentric hypertrophy.-LVEF= 65%  to 70%.  - (grade 1 diastolic dysfunction). - Aortic valve: Trileaflet; moderately thickened, moderately calcified leaflets.  - Left atrium: moderately dilated. - Tricuspid valve:  moderate regurgitation. - Pulmonary arteries:PA peak pressure: 59 mm Hg (S).    Culture 6/13 blood right arm/hand NGTD 6/14 Legionella urinary antigen native 6/14 strep pneumo urine antigen negative  6/14 sputum culture not collected   Antibiotics: Zosyn 6/13>> Vancomycin 6/13>>  DVT prophylaxis: Lovenox    Devices    LINES / TUBES:      Continuous Infusions: . sodium chloride Stopped (08/19/14 1222)    Objective: VITAL SIGNS: Temp: 98.2 F (36.8 C) (06/19 1128) Temp Source: Oral (06/19 1128) BP: 141/43 mmHg (06/19 1200) Pulse Rate: 85 (06/19 1200) SPO2; FIO2:   Intake/Output Summary (Last 24 hours) at 08/19/14 1458 Last data filed at 08/19/14 1222  Gross per 24 hour  Intake 1023.67 ml  Output      0 ml  Net 1023.67 ml     Exam: General: A/O 4, back to baseline (joking with staff), multiple skin lesions on head and bilateral lower extremity (has been addressed by dermatologist) Eyes: Negative headache, negative retinal hemorrhage ENT: Negative Runny nose, negative ear pain, negative tinnitus, negative gingival bleeding Neck:  Negative scars, masses, torticollis, lymphadenopathy, JVD Lungs: improved air movement RML/RLL/LLL, without wheezes, positive crackles Cardiovascular: Regular rhythm and rate, positive systolic murmur 3/6, negative gallop or rub normal S1 and S2 Abdomen: Morbidly obese, negative abdominal pain, negative dysphagia, Nontender, nondistended, soft, bowel sounds positive, no rebound, no ascites, no appreciable mass Extremities: No significant cyanosis, clubbing, or edema bilateral lower extremities, multiple skin lesions bilateral lower extremity, bilateral pitting edema 2+ Psychiatric:  Negative depression, negative anxiety, negative fatigue, negative  mania  Neurologic:  Cranial nerves II through XII intact, tongue/uvula midline, all extremities muscle strength 5/5, sensation intact negative dysarthria, negative expressive aphasia, negative receptive aphasia.     Data Reviewed: Basic Metabolic Panel:  Recent Labs Lab 08/16/14 0313 08/17/14 0227 08/17/14 1923 08/18/14 0557 08/19/14 0256  NA 146* 144 141 143 144  K 3.2* 4.0 4.2 3.5 3.4*  CL 99* 101 101 99* 97*  CO2 41* 40* 32 39* 42*  GLUCOSE 84 99 152* 83 95  BUN 13 11 13 10 9   CREATININE 0.58 0.58 0.66 0.62 0.55  CALCIUM 8.7* 8.7* 8.8* 8.9 9.0  MG  --  1.8  --  1.9 1.8   Liver Function Tests:  Recent Labs Lab 08/16/14 0313 08/17/14 0227 08/17/14 1923 08/18/14 0557 08/19/14 0256  AST 26 24 34 27 28  ALT 20 19 24 22 23   ALKPHOS 36* 31* 37* 36* 37*  BILITOT 1.1 1.0 0.7 0.8 0.8  PROT 4.5* 4.4* 4.6* 4.6* 4.4*  ALBUMIN 2.1* 2.2* 2.3* 2.2* 2.2*   No results for input(s): LIPASE, AMYLASE in the last 168 hours. No results for input(s): AMMONIA in the last 168 hours. CBC:  Recent Labs Lab 08/13/14 2007 08/14/14 0800 08/16/14 0313 08/17/14 0227 08/18/14 0557 08/19/14 0256  WBC 4.7 6.0 3.2* 2.8* 2.7* 2.6*  NEUTROABS 3.2 5.5  --  1.9 1.7 1.8  HGB 11.4* 10.9* 9.7* 9.6* 9.8* 10.1*  HCT 39.2 36.2 32.0* 31.1* 32.0* 33.4*  MCV 109.2* 105.2* 103.9* 104.4* 103.6* 104.0*  PLT 131* 125* 103* 117* 127* 126*   Cardiac Enzymes:  Recent Labs Lab 08/15/14 0650 08/16/14 0313 08/17/14  1923 08/18/14 0029 08/18/14 0557  TROPONINI 0.10* 0.08* 0.05* 0.06* 0.06*   BNP (last 3 results)  Recent Labs  07/11/14 1525 07/22/14 1810 08/13/14 2007  BNP 282.5* 715.8* 454.8*    ProBNP (last 3 results) No results for input(s): PROBNP in the last 8760 hours.  CBG: No results for input(s): GLUCAP in the last 168 hours.  Recent Results (from the past 240 hour(s))  Blood culture (routine x 2)     Status: None (Preliminary result)   Collection Time: 08/13/14  7:56 PM    Result Value Ref Range Status   Specimen Description BLOOD RIGHT ARM  Final   Special Requests   Final    BOTTLES DRAWN AEROBIC AND ANAEROBIC 10CC BLUE, 5CC RED   Culture   Final           BLOOD CULTURE RECEIVED NO GROWTH TO DATE CULTURE WILL BE HELD FOR 5 DAYS BEFORE ISSUING A FINAL NEGATIVE REPORT Performed at [facility]    Report Status PENDING  Incomplete  Blood culture (routine x 2)     Status: None (Preliminary result)   Collection Time: 08/13/14  8:09 PM  Result Value Ref Range Status   Specimen Description BLOOD RIGHT HAND  Final   Special Requests BOTTLES DRAWN AEROBIC ONLY 5CC  Final   Culture   Final           BLOOD CULTURE RECEIVED NO GROWTH TO DATE CULTURE WILL BE HELD FOR 5 DAYS BEFORE ISSUING A FINAL NEGATIVE REPORT Performed at [facility]    Report Status PENDING  Incomplete     Studies:  Recent x-ray studies have been reviewed in detail by the Attending Physician  Scheduled Meds:  Scheduled Meds: . antiseptic oral rinse  7 mL Mouth Rinse BID  . atorvastatin  10 mg Oral Daily  . clopidogrel  75 mg Oral Q breakfast  . dextromethorphan-guaiFENesin  1 tablet Oral BID  . enoxaparin (LOVENOX) injection  40 mg Subcutaneous Q24H  . feeding supplement (ENSURE ENLIVE)  237 mL Oral BID BM  . furosemide  40 mg Intravenous Daily  . gabapentin  300 mg Oral QHS  . levothyroxine  125 mcg Oral QAC breakfast  . pantoprazole  40 mg Oral Daily  . piperacillin-tazobactam (ZOSYN)  IV  3.375 g Intravenous 3 times per day  . predniSONE  40 mg Oral Q breakfast  . sertraline  50 mg Oral Daily  . vancomycin  1,250 mg Intravenous Q24H    Time spent on care of this patient: 40 mins   WOODS, Geraldo Docker , MD  [facility] Office  [phone] Pager [phone]  On-Call/Text Page:      [email or web address]      password [email or web address]  If 7PM-7AM, please contact night-coverage [email or web address] Password [email or web address] 08/19/2014, 2:58 PM   LOS: 6 days   Care during the  described time interval was provided by me .  I have reviewed this patient's available data, including medical history, events of note, physical examination, and all test results as part of my evaluation. I have personally reviewed and interpreted all radiology studies.   Dia Crawford, MD [phone] Pager

## 2014-08-19 NOTE — Progress Notes (Signed)
Patient noted to start desat to low 80's. Patient is noted to be a mouth breather when asleep. O2 placed at opening of mouth and sats up to 99%. Patient resting comfortably. [facility] [employer]

## 2014-08-20 ENCOUNTER — Inpatient Hospital Stay (HOSPITAL_COMMUNITY): Payer: Medicare Other

## 2014-08-20 LAB — CBC WITH DIFFERENTIAL/PLATELET
Basophils Absolute: 0 10*3/uL (ref 0.0–0.1)
Basophils Relative: 0 % (ref 0–1)
EOS ABS: 0.2 10*3/uL (ref 0.0–0.7)
EOS PCT: 6 % — AB (ref 0–5)
HEMATOCRIT: 30.3 % — AB (ref 36.0–46.0)
HEMOGLOBIN: 9.5 g/dL — AB (ref 12.0–15.0)
Lymphocytes Relative: 16 % (ref 12–46)
Lymphs Abs: 0.5 10*3/uL — ABNORMAL LOW (ref 0.7–4.0)
MCH: 32.3 pg (ref 26.0–34.0)
MCHC: 31.4 g/dL (ref 30.0–36.0)
MCV: 103.1 fL — AB (ref 78.0–100.0)
MONOS PCT: 10 % (ref 3–12)
Monocytes Absolute: 0.3 10*3/uL (ref 0.1–1.0)
NEUTROS ABS: 2 10*3/uL (ref 1.7–7.7)
Neutrophils Relative %: 68 % (ref 43–77)
Platelets: 125 10*3/uL — ABNORMAL LOW (ref 150–400)
RBC: 2.94 MIL/uL — ABNORMAL LOW (ref 3.87–5.11)
RDW: 13.2 % (ref 11.5–15.5)
WBC: 2.9 10*3/uL — ABNORMAL LOW (ref 4.0–10.5)

## 2014-08-20 LAB — COMPREHENSIVE METABOLIC PANEL
ALT: 23 U/L (ref 14–54)
AST: 26 U/L (ref 15–41)
Albumin: 2.2 g/dL — ABNORMAL LOW (ref 3.5–5.0)
Alkaline Phosphatase: 33 U/L — ABNORMAL LOW (ref 38–126)
Anion gap: 3 — ABNORMAL LOW (ref 5–15)
BUN: 6 mg/dL (ref 6–20)
CO2: 45 mmol/L — ABNORMAL HIGH (ref 22–32)
Calcium: 8.7 mg/dL — ABNORMAL LOW (ref 8.9–10.3)
Chloride: 95 mmol/L — ABNORMAL LOW (ref 101–111)
Creatinine, Ser: 0.63 mg/dL (ref 0.44–1.00)
GFR calc Af Amer: >60 mL/min (ref >60)
GLUCOSE: 76 mg/dL (ref 65–99)
POTASSIUM: 3.5 mmol/L (ref 3.5–5.1)
SODIUM: 143 mmol/L (ref 135–145)
TOTAL PROTEIN: 4.5 g/dL — AB (ref 6.5–8.1)
Total Bilirubin: 0.7 mg/dL (ref 0.3–1.2)

## 2014-08-20 LAB — CULTURE, BLOOD (ROUTINE X 2)
CULTURE: NO GROWTH
Culture: NO GROWTH

## 2014-08-20 LAB — MAGNESIUM: MAGNESIUM: 1.6 mg/dL — AB (ref 1.7–2.4)

## 2014-08-20 MED ORDER — PREDNISONE 20 MG PO TABS
20.0000 mg | ORAL_TABLET | Freq: Every day | ORAL | Status: AC
  Administered 2014-08-21 – 2014-08-22 (×2): 20 mg via ORAL
  Filled 2014-08-20 (×3): qty 1

## 2014-08-20 MED ORDER — POTASSIUM CHLORIDE CRYS ER 20 MEQ PO TBCR
40.0000 meq | EXTENDED_RELEASE_TABLET | Freq: Once | ORAL | Status: AC
  Administered 2014-08-20: 40 meq via ORAL
  Filled 2014-08-20: qty 2

## 2014-08-20 MED ORDER — MAGNESIUM SULFATE 2 GM/50ML IV SOLN
2.0000 g | Freq: Once | INTRAVENOUS | Status: AC
  Administered 2014-08-20: 2 g via INTRAVENOUS
  Filled 2014-08-20: qty 50

## 2014-08-20 MED ORDER — PREDNISONE 20 MG PO TABS: 20.0000 mg | ORAL_TABLET | Freq: Every day | ORAL | Status: DC

## 2014-08-20 NOTE — Progress Notes (Signed)
[place] TEAM 1 - Stepdown/ICU TEAM Progress Note  Kirsten Flores [id number]:[id number] DOB: 10/17/1923 DOA: 08/13/2014 PCP: Garnet Koyanagi, DO  Admit HPI / Brief Narrative: 81 yo F w/ Hx dyslipidemia, hypertension, hypothyroidism, morbid obesity, COPD on 4 L O2 at home, OSA/OHS on CPAP at night who presented with sudden onset of shortness of breath associated with cough.  The patient had recently been hospitalized for a COPD exacerbation with acute hypercarbia and discharged to a nursing home.  At the nursing home the patient was reportedly placed on 2L  O2 as well as a tapering dose of prednisone. Of note her son felt she had been using 4 L of oxygen at home.  Patient had been more drowsy and on routine evaluation she was found to be saturating 50% on her 2 L of nasal cannula, prompting her transfer to the ED.  In the ED she rapidly improved after initiation of BIPAP.   HPI/Subjective: The patient is awake and alert.  She reports that her shortness of breath is steadily improving.  She denies current chest pain nausea vomiting or abdominal pain.  Her nurse informs me that she has barely gotten out of bed.  She is extremely weak and has to be lifted from the bed using a mechanical lift.  Assessment/Plan:  Acute on chronic hypoxic and hypercarbic respiratory failure  -multifactorial to include OSA/OHS, polypharmacy, COPD exacerbation, ? sporadic aspiration pneumonitis, and incorrect SNF O2 regimen - appears to be stabilizing at this time - continue gentle diuresis - wean oxygen as able  Possible aspiration -SLP eval has cleared her for a regular diet with thin liquids - if she did suffer an aspiration episode and appears to been a sporadic event and not a predictable recurring event  Bilateral pleural effusions right > left -Remains on Lasix daily - chest x-ray today actually suggest slight improvement/decrease in size of effusions - continue gentle diuresis and follow I's/O's as well as serial  x-rays  OSA/OHS -Continue CPAP at night for respiratory to maintain SPO2 89-93%  COPD exacerbation -Has completed a seven-day antibiotics course - no wheezing on exam today - continue to wean steroids - presently unclear why or how long patient has been on steroids at home but it appears from her hospital stay in May that this was simply a short course and not continued beyond 24 hours of discharge  Chronic diastolic congestive heart failure -no clinical evidence of marked volume overload at this time - follow Is/Os - baseline weight appears to be approximately 95 kg - patient currently 94.5 kg  Mildly elevated troponin -Likely related to respiratory distress - peaked at 0.1 - no further workup indicated  Pulmonary hypertension  Polypharmacy -Holding all sedating medications - reduced dose of neurontin   Hypothyroidism. -Continue levothyroxine - TSH 0.43  Hypernatremia -Present at admission and resolved with initial gentle volume resuscitation  Code Status: DNR - NO CODE  Family Communication: no family present at time of exam Disposition Plan: Transfer to medical bed - begin PT/OT efforts - suspicious patient will work with prior SNF - continue to wean oxygen as able - continue gentle diuresis for pulmonary edema/pleural effusions  Consultants: none  Procedures: none  Antibiotics: Zosyn 6/13 > 6/20 Vancomycin 6/13 > 6/19  DVT prophylaxis: Lovenox   Objective: Blood pressure 130/29, pulse 82, temperature 98 F (36.7 C), temperature source Oral, resp. rate 33, height 5\' 5"  (1.651 m), weight 94.5 kg (208 lb 5.4 oz), SpO2 97 %.  Intake/Output Summary (  Last 24 hours) at 08/20/14 1521 Last data filed at 08/20/14 1200  Gross per 24 hour  Intake 746.33 ml  Output      0 ml  Net 746.33 ml   Exam: General: No acute respiratory distress - alert and oriented - laying flat in bed  Lungs: Poor air movement bilateral bases - no wheeze Cardiovascular: Regular rate and rhythm  with 3/6 holosystolic murmur Abdomen: Nontender, nondistended, soft, bowel sounds positive, no rebound, no ascites, no appreciable mass Extremities: No significant cyanosis, or clubbing, trace edema bilateral lower extremities  Data Reviewed: Basic Metabolic Panel:  Recent Labs Lab 08/17/14 0227 08/17/14 1923 08/18/14 0557 08/19/14 0256 08/20/14 0301  NA 144 141 143 144 143  K 4.0 4.2 3.5 3.4* 3.5  CL 101 101 99* 97* 95*  CO2 40* 32 39* 42* 45*  GLUCOSE 99 152* 83 95 76  BUN 11 13 10 9 6   CREATININE 0.58 0.66 0.62 0.55 0.63  CALCIUM 8.7* 8.8* 8.9 9.0 8.7*  MG 1.8  --  1.9 1.8 1.6*   Liver Function Tests:  Recent Labs Lab 08/17/14 0227 08/17/14 1923 08/18/14 0557 08/19/14 0256 08/20/14 0301  AST 24 34 27 28 26   ALT 19 24 22 23 23   ALKPHOS 31* 37* 36* 37* 33*  BILITOT 1.0 0.7 0.8 0.8 0.7  PROT 4.4* 4.6* 4.6* 4.4* 4.5*  ALBUMIN 2.2* 2.3* 2.2* 2.2* 2.2*   CBC:  Recent Labs Lab 08/14/14 0800 08/16/14 0313 08/17/14 0227 08/18/14 0557 08/19/14 0256 08/20/14 0301  WBC 6.0 3.2* 2.8* 2.7* 2.6* 2.9*  NEUTROABS 5.5  --  1.9 1.7 1.8 2.0  HGB 10.9* 9.7* 9.6* 9.8* 10.1* 9.5*  HCT 36.2 32.0* 31.1* 32.0* 33.4* 30.3*  MCV 105.2* 103.9* 104.4* 103.6* 104.0* 103.1*  PLT 125* 103* 117* 127* 126* 125*   Cardiac Enzymes:  Recent Labs Lab 08/15/14 0650 08/16/14 0313 08/17/14 1923 08/18/14 0029 08/18/14 0557  TROPONINI 0.10* 0.08* 0.05* 0.06* 0.06*    Recent Results (from the past 240 hour(s))  Blood culture (routine x 2)     Status: None   Collection Time: 08/13/14  7:56 PM  Result Value Ref Range Status   Specimen Description BLOOD RIGHT ARM  Final   Special Requests   Final    BOTTLES DRAWN AEROBIC AND ANAEROBIC 10CC BLUE, 5CC RED   Culture   Final    NO GROWTH 5 DAYS Performed at [facility]    Report Status 08/20/2014 FINAL  Final  Blood culture (routine x 2)     Status: None   Collection Time: 08/13/14  8:09 PM  Result Value Ref Range Status    Specimen Description BLOOD RIGHT HAND  Final   Special Requests BOTTLES DRAWN AEROBIC ONLY 5CC  Final   Culture   Final    NO GROWTH 5 DAYS Performed at [facility]    Report Status 08/20/2014 FINAL  Final     Studies:  Recent x-ray studies have been reviewed in detail by the Attending Physician  Scheduled Meds:  Scheduled Meds: . antiseptic oral rinse  7 mL Mouth Rinse BID  . atorvastatin  10 mg Oral Daily  . clopidogrel  75 mg Oral Q breakfast  . dextromethorphan-guaiFENesin  1 tablet Oral BID  . enoxaparin (LOVENOX) injection  40 mg Subcutaneous Q24H  . feeding supplement (ENSURE ENLIVE)  237 mL Oral BID BM  . furosemide  40 mg Intravenous Daily  . gabapentin  300 mg Oral QHS  .  levothyroxine  125 mcg Oral QAC breakfast  . pantoprazole  40 mg Oral Daily  . piperacillin-tazobactam (ZOSYN)  IV  3.375 g Intravenous 3 times per day  . predniSONE  40 mg Oral Q breakfast  . sertraline  50 mg Oral Daily  . vancomycin  1,250 mg Intravenous Q24H    Time spent on care of this patient: 35 mins  Cherene Altes, MD [facility] For Consults/Admissions - Flow Manager - [phone] Office  [phone]  Contact MD directly via text page:      [email or web address]      password [facility]  08/20/2014, 3:21 PM   LOS: 7 days

## 2014-08-20 NOTE — Progress Notes (Signed)
Patient trasfered from [facility] to 5W08 via bed; alert and oriented x 4; no complaints of pain; IV saline locked in LFA; skin intact, dry and flaky; discoloration BLE. Orient patient to room and unit;  instructed how to use the call bell and  fall risk precautions. Will continue to monitor the patient.

## 2014-08-20 NOTE — Care Management Note (Signed)
Case Management Note  Patient Details  Name: Kirsten Flores MRN: [id number] Date of Birth: 01-12-1924  Subjective/Objective:       Adm w resp failure             Action/Plan: from nsg facility, sw involved   Expected Discharge Date:                  Expected Discharge Plan:     In-House Referral:  Clinical Social Work  Discharge planning Services  CM Consult  Post Acute Care Choice:    Choice offered to:     DME Arranged:    DME Agency:     HH Arranged:    [place] Agency:     Status of Service:  Completed, signed off  Medicare Important Message Given:  Yes Date Medicare IM Given:  08/17/14 Medicare IM give by:  Jonnie Finner RN  Date Additional Medicare IM Given:    Additional Medicare Important Message give by:     If discussed at [place] of Stay Meetings, dates discussed:  08/21/14  Additional Comments: ur review done  Lacretia Leigh, RN 08/20/2014, 9:32 AM

## 2014-08-20 NOTE — Progress Notes (Signed)
Speech Language Pathology Treatment: Dysphagia  Patient Details Name: Kirsten Flores MRN: [id number] DOB: [birth year] Today's Date: 08/20/2014 Time: 6160-7371 SLP Time Calculation (min) (ACUTE ONLY): 13 min  Assessment / Plan / Recommendation Clinical Impression  Pt seen after am meal, pt did not eat any eggs or sausage on tray (ground). Observed with thin liquids without signs of aspiration, pt would not accept any solid trials, but reports having eaten her muffin well and dislike chopped texture. Pt would like to upgrade to regular. Given that respiratory function has improved and recommendation for downgrade based on potiential risk with pt on and off BIPAP, will change diet to reguklar solids with thin liquids. Continue basic precautions. No SLP f/u needed will sign off   HPI Other Pertinent Information: Pt is a 81 y.o. female with PMH of COPD (on hom O2- 2-4L, hypothyroid, HTN, and memory loss.Was recently hospitalized and discharged 5/26 to SNF. Readmitted 6/13 for shortness of breath progressively worsening and associated with cough. CXR revealed patchy bilateral LL opacities, likely atelectasis and suspected small R pleural effusion. Had bedside swallow eval on recent admission- recommendation was dysphagia 3/ thin liquids. Pt currently on soft diet. Bedside swallow eval ordered to assess swallow function and aid in ruling out aspiration.    Pertinent Vitals Pain Assessment: No/denies pain  SLP Plan  All goals met    Recommendations Diet recommendations: Regular;Thin liquid Liquids provided via: Straw Medication Administration: Whole meds with puree Supervision: Intermittent supervision to cue for compensatory strategies Compensations: Slow rate;Small sips/bites Postural Changes and/or Swallow Maneuvers: Seated upright 90 degrees              Plan: All goals met    GO    Herbie Baltimore, MA CCC-SLP [phone]  Lynann Beaver 08/20/2014, 8:45 AM

## 2014-08-20 NOTE — Progress Notes (Signed)
[place] for vancomycin and zosyn Indication: pneumonia  Allergies  Allergen Reactions  . Diltiazem Hcl Other (See Comments)    Unknown reaction  . Neomycin Other (See Comments)    Unknown reaction (a long time ago)   Patient Measurements: Height: 5\' 5"  (165.1 cm) Weight: 208 lb 5.4 oz (94.5 kg) IBW/kg (Calculated) : 57 Adjusted Body Weight:   Vital Signs: Temp: 97.9 F (36.6 C) (06/20 0752) Temp Source: Oral (06/20 0752) BP: 140/36 mmHg (06/20 0752) Pulse Rate: 81 (06/20 0752) Intake/Output from previous day: 06/19 0701 - 06/20 0700 In: 1322.5 [P.O.:1200; I.V.:60; IV Piggyback:62.5] Out: -  Intake/Output from this shift:    Labs:  Recent Labs  08/18/14 0557 08/19/14 0256 08/20/14 0301  WBC 2.7* 2.6* 2.9*  HGB 9.8* 10.1* 9.5*  PLT 127* 126* 125*  CREATININE 0.62 0.55 0.63   Estimated Creatinine Clearance: 54.2 mL/min (by C-G formula based on Cr of 0.63).  Recent Labs  08/17/14 1923  VANCOTROUGH 12    Assessment: 81 yo female admitted with SOB and hypoxia. Had a recent admission for acute on chronic respiratory failure. Started antibiotics for possible pneumonia on 6/13. Pt is afebrile, WBC low at 2.9. Scr has been stable. Doses remain appropriate.   6/13 vanc > 6/13 zosyn >  6/13 blood UP:JSRP  Goal of Therapy:  Vancomycin trough level 15-20 mcg/ml  Plan:  - Continue abx for now - consider DC today - F/u renal fxn, C&S, clinical status  Salome Arnt, PharmD, BCPS Pager # [phone] 08/20/2014 8:45 AM

## 2014-08-21 DIAGNOSIS — G4733 Obstructive sleep apnea (adult) (pediatric): Secondary | ICD-10-CM

## 2014-08-21 DIAGNOSIS — J90 Pleural effusion, not elsewhere classified: Secondary | ICD-10-CM

## 2014-08-21 LAB — BASIC METABOLIC PANEL
Anion gap: 5 (ref 5–15)
BUN: 7 mg/dL (ref 6–20)
CO2: 43 mmol/L — ABNORMAL HIGH (ref 22–32)
Calcium: 8.8 mg/dL — ABNORMAL LOW (ref 8.9–10.3)
Chloride: 95 mmol/L — ABNORMAL LOW (ref 101–111)
Creatinine, Ser: 0.49 mg/dL (ref 0.44–1.00)
Glucose, Bld: 83 mg/dL (ref 65–99)
Potassium: 3.7 mmol/L (ref 3.5–5.1)
Sodium: 143 mmol/L (ref 135–145)

## 2014-08-21 LAB — MAGNESIUM: MAGNESIUM: 2.1 mg/dL (ref 1.7–2.4)

## 2014-08-21 MED ORDER — BUDESONIDE 0.25 MG/2ML IN SUSP
0.2500 mg | Freq: Two times a day (BID) | RESPIRATORY_TRACT | Status: DC
  Administered 2014-08-22 – 2014-08-25 (×8): 0.25 mg via RESPIRATORY_TRACT
  Filled 2014-08-21 (×12): qty 2

## 2014-08-21 MED ORDER — FUROSEMIDE 10 MG/ML IJ SOLN
40.0000 mg | Freq: Once | INTRAMUSCULAR | Status: AC
  Administered 2014-08-21: 40 mg via INTRAVENOUS
  Filled 2014-08-21: qty 4

## 2014-08-21 NOTE — Progress Notes (Signed)
RN called to room because pt. Stated she can't breath & needs more O2.  O2 @ 2.5 L, [place], increased to 4L and had pt. Sit up in bed.  Audible wheezing heard, O2 sats on 4 L 79/80 %.  Increase O2 to 5L, O2 sats up to 87%.  Text paged Dr. Dyann Kief and return call with new orders.  Will carry out orders and continue to monitor.  Alphonzo Lemmings, RN

## 2014-08-21 NOTE — Progress Notes (Signed)
Progress Note  Kirsten Flores YPP:[id number] DOB: [birth year] DOA: 08/13/2014 PCP: Garnet Koyanagi, DO  Admit HPI / Brief Narrative: 81 yo F w/ Hx dyslipidemia, hypertension, hypothyroidism, morbid obesity, COPD on 4 L O2 at home, OSA/OHS on CPAP at night who presented with sudden onset of shortness of breath associated with cough.  The patient had recently been hospitalized for a COPD exacerbation with acute hypercarbia and discharged to a nursing home.  At the nursing home the patient was reportedly placed on 2L [place] O2 as well as a tapering dose of prednisone. Of note her son felt she had been using 4 L of oxygen at home.  Patient had been more drowsy and on routine evaluation she was found to be saturating 50% on her 2 L of nasal cannula, prompting her transfer to the ED.  In the ED she rapidly improved after initiation of BIPAP.   HPI/Subjective: The patient is awake and alert.  She reports that her shortness of breath is somewhat better but still feels SOB. Unable to speak in full sentences and with positive orthopnea sx's. Denies CP.  Assessment/Plan:  Acute on chronic hypoxic and hypercarbic respiratory failure  -multifactorial to include OSA/OHS, polypharmacy, COPD exacerbation, ? sporadic aspiration pneumonitis, and incorrect SNF O2 regimen  -slowly improving -will cont tx for COPD and also continue diuresis -continue O2 supplementation.  Possible aspiration -SLP eval has cleared her for a regular diet with thin liquids - if she did suffer an aspiration episode and appears to been a sporadic event and not a predictable recurring event  Bilateral pleural effusions right > left -Remains on Lasix daily  -follow CXR  OSA/OHS -Continue CPAP at night for respiratory to maintain SPO2 89-93%  COPD exacerbation -Has completed a seven-day antibiotics course -slight exp wheezing on exam today  -continue to wean steroids  -will continue O2 supplementation and will start pulmicort  Chronic  diastolic congestive heart failure -positive crackles and SOB -positive orthopnea -will give extra dose of lasix -will check CXR in am -follow Is/Os -heart healthy diet -continue oxygen supplementation.  Mildly elevated troponin -Likely related to respiratory distress - peaked at 0.1 - no further workup indicated at this moment -patient not a candidate for any intervention and per discussion with patient just want to improve her breating to go home.  Pulmonary hypertension Continue lasix, if BP stable will add low dose hydralazine  Polypharmacy -Holding all sedating medications, to prevent decrease breathing drive - reduced dose of neurontin   Hypothyroidism. -Continue levothyroxine  -TSH 0.43  Hypernatremia -Present at admission and resolved with initial gentle volume resuscitation  Code Status: DNR - NO CODE  Family Communication: no family present at time of exam Disposition Plan: continue diuresis, will need SNF at discharge. Titrate Oxygen supplementation if possible.  Consultants: none  Procedures: none  Antibiotics: Zosyn 6/13 > 6/20 Vancomycin 6/13 > 6/19  DVT prophylaxis: Lovenox   Objective: Blood pressure 137/56, pulse 82, temperature 97.8 F (36.6 C), temperature source Oral, resp. rate 18, height 5\' 5"  (1.651 m), weight 90.81 kg (200 lb 3.2 oz), SpO2 94 %.  Intake/Output Summary (Last 24 hours) at 08/21/14 2203 Last data filed at 08/21/14 2000  Gross per 24 hour  Intake    594 ml  Output      0 ml  Net    594 ml   Exam: General: Patient SOB and unable to speak in full sentences. Denies CP, nausea and vomiting. Wearing [place] for oxygen supplementation. Lungs: Poor  air movement bilateral bases, fine crackles and mild exp wheezes Cardiovascular: Regular rate and rhythm with 3/6 holosystolic murmur Abdomen: Nontender, nondistended, soft, bowel sounds positive, no rebound, no ascites, no appreciable mass Extremities: No cyanosis, or clubbing, trace  edema bilateral lower extremities. Patient with stasis dermatitis from venous insufficiency bilaterally  Data Reviewed: Basic Metabolic Panel:  Recent Labs Lab 08/17/14 0227 08/17/14 1923 08/18/14 0557 08/19/14 0256 08/20/14 0301 08/21/14 0600  NA 144 141 143 144 143 143  K 4.0 4.2 3.5 3.4* 3.5 3.7  CL 101 101 99* 97* 95* 95*  CO2 40* 32 39* 42* 45* 43*  GLUCOSE 99 152* 83 95 76 83  BUN 11 13 10 9 6 7   CREATININE 0.58 0.66 0.62 0.55 0.63 0.49  CALCIUM 8.7* 8.8* 8.9 9.0 8.7* 8.8*  MG 1.8  --  1.9 1.8 1.6* 2.1   Liver Function Tests:  Recent Labs Lab 08/17/14 0227 08/17/14 1923 08/18/14 0557 08/19/14 0256 08/20/14 0301  AST 24 34 27 28 26   ALT 19 24 22 23 23   ALKPHOS 31* 37* 36* 37* 33*  BILITOT 1.0 0.7 0.8 0.8 0.7  PROT 4.4* 4.6* 4.6* 4.4* 4.5*  ALBUMIN 2.2* 2.3* 2.2* 2.2* 2.2*   CBC:  Recent Labs Lab 08/16/14 0313 08/17/14 0227 08/18/14 0557 08/19/14 0256 08/20/14 0301  WBC 3.2* 2.8* 2.7* 2.6* 2.9*  NEUTROABS  --  1.9 1.7 1.8 2.0  HGB 9.7* 9.6* 9.8* 10.1* 9.5*  HCT 32.0* 31.1* 32.0* 33.4* 30.3*  MCV 103.9* 104.4* 103.6* 104.0* 103.1*  PLT 103* 117* 127* 126* 125*   Cardiac Enzymes:  Recent Labs Lab 08/15/14 0650 08/16/14 0313 08/17/14 1923 08/18/14 0029 08/18/14 0557  TROPONINI 0.10* 0.08* 0.05* 0.06* 0.06*    Recent Results (from the past 240 hour(s))  Blood culture (routine x 2)     Status: None   Collection Time: 08/13/14  7:56 PM  Result Value Ref Range Status   Specimen Description BLOOD RIGHT ARM  Final   Special Requests   Final    BOTTLES DRAWN AEROBIC AND ANAEROBIC 10CC BLUE, 5CC RED   Culture   Final    NO GROWTH 5 DAYS Performed at [facility]    Report Status 08/20/2014 FINAL  Final  Blood culture (routine x 2)     Status: None   Collection Time: 08/13/14  8:09 PM  Result Value Ref Range Status   Specimen Description BLOOD RIGHT HAND  Final   Special Requests BOTTLES DRAWN AEROBIC ONLY 5CC  Final   Culture    Final    NO GROWTH 5 DAYS Performed at [facility]    Report Status 08/20/2014 FINAL  Final     Studies:  Recent x-ray studies have been reviewed in detail by the Attending Physician  Scheduled Meds:  Scheduled Meds: . antiseptic oral rinse  7 mL Mouth Rinse BID  . atorvastatin  10 mg Oral Daily  . budesonide (PULMICORT) nebulizer solution  0.25 mg Nebulization BID  . clopidogrel  75 mg Oral Q breakfast  . dextromethorphan-guaiFENesin  1 tablet Oral BID  . enoxaparin (LOVENOX) injection  40 mg Subcutaneous Q24H  . feeding supplement (ENSURE ENLIVE)  237 mL Oral BID BM  . furosemide  40 mg Intravenous Daily  . gabapentin  300 mg Oral QHS  . levothyroxine  125 mcg Oral QAC breakfast  . pantoprazole  40 mg Oral Daily  . predniSONE  20 mg Oral Q breakfast  . sertraline  50 mg  Oral Daily    Time spent on care of this patient: 35 mins  Barton Dubois [phone]  08/21/2014, 10:03 PM   LOS: 8 days

## 2014-08-21 NOTE — Progress Notes (Signed)
Utilization Review completed. Mirenda Baltazar RN BSN CM 

## 2014-08-21 NOTE — Progress Notes (Signed)
Physical Therapy Treatment Patient Details Name: Kirsten Flores MRN: [id number] DOB: 01/11/1924 Today's Date: 08/21/2014    History of Present Illness Pt with h/o mixed COPD (on home O2- 4L), hypothyroid, HTN, and memory loss.Patient admitted with respiratory failure from [facility] with plan to return     PT Comments    Pt de-sats quickly with rolling and SOB noted.  Unable to participate in sitting EOB today.  Con't to recommend SNF.  Follow Up Recommendations  SNF;Supervision/Assistance - 24 hour     Equipment Recommendations  None recommended by PT    Recommendations for Other Services       Precautions / Restrictions Precautions Precautions: Fall Precaution Comments: watch sats Restrictions Weight Bearing Restrictions: No    Mobility  Bed Mobility Overal bed mobility: Needs Assistance Bed Mobility: Rolling Rolling: Max assist         General bed mobility comments: Rolling back and forth for cleaning. O2 sats dropping to 81% and SOB noted even with o2 intact.  Cues for breathing techniques.  Total A to pull up in bed with o2 sats 91% on oxygen when finished with PT.  Transfers                 General transfer comment: Pt too fatigued from rolling and cleaning.  Student nurse informed that pt would like A to eat her late breakfast.  Ambulation/Gait                 Stairs            Wheelchair Mobility    Modified Rankin (Stroke Patients Only)       Balance                                    Cognition Arousal/Alertness: Awake/alert Behavior During Therapy: WFL for tasks assessed/performed Overall Cognitive Status: No family/caregiver present to determine baseline cognitive functioning                 General Comments: Pt thinking that PT was her grand-daughter from [place].    Exercises      General Comments        Pertinent Vitals/Pain  O2 81-91% throughout session with 91% at end.  Hovered around 86%  throughout most of session with moving and sidelying    Home Living                      Prior Function            PT Goals (current goals can now be found in the care plan section) Acute Rehab PT Goals PT Goal Formulation: With patient Time For Goal Achievement: 08/31/14 Potential to Achieve Goals: Fair Progress towards PT goals: Not progressing toward goals - comment (fatigues and de-sats quickly)    Frequency  Min 2X/week    PT Plan Current plan remains appropriate    Co-evaluation             End of Session Equipment Utilized During Treatment: Oxygen Activity Tolerance: Patient limited by fatigue Patient left: in bed;with call bell/phone within reach     Time: 0945-1002 PT Time Calculation (min) (ACUTE ONLY): 17 min  Charges:  $Therapeutic Activity: 8-22 mins                    G Codes:      Kirsten Flores 08/21/2014,  12:27 PM

## 2014-08-21 NOTE — Clinical Social Work Note (Addendum)
Unfortunately [facility] does not have a bed for patient. CSW has provided patient's son (per patient and son's request) with bed offers in a voicemail to Randy's phone along with encouragement to choose a facility for placement.  Bed offers also given to patient.   Liz Beach MSW, [place], [place], [phone]

## 2014-08-21 NOTE — Progress Notes (Signed)
OT Cancellation Note  Patient Details Name: Kirsten Flores MRN: [id number] DOB: Dec 20, 1923   Cancelled Treatment:    Reason Eval/Treat Not Completed: Other (comment). Pt evaluated by OT on 6/17 and deferred further OT needs to next venue of care.   Benito Mccreedy OTR/L [phone] 08/21/2014, 8:49 AM

## 2014-08-21 NOTE — Plan of Care (Signed)
Problem: ICU Phase Progression Outcomes Goal: Initial discharge plan identified Outcome: Completed/Met Date Met:  08/21/14 To return to SNF for rehab

## 2014-08-22 ENCOUNTER — Inpatient Hospital Stay (HOSPITAL_COMMUNITY): Payer: Medicare Other

## 2014-08-22 DIAGNOSIS — R7989 Other specified abnormal findings of blood chemistry: Secondary | ICD-10-CM

## 2014-08-22 DIAGNOSIS — R799 Abnormal finding of blood chemistry, unspecified: Secondary | ICD-10-CM

## 2014-08-22 DIAGNOSIS — I5033 Acute on chronic diastolic (congestive) heart failure: Secondary | ICD-10-CM

## 2014-08-22 LAB — BASIC METABOLIC PANEL
Anion gap: 8 (ref 5–15)
BUN: 9 mg/dL (ref 6–20)
CHLORIDE: 92 mmol/L — AB (ref 101–111)
CO2: 44 mmol/L — ABNORMAL HIGH (ref 22–32)
CREATININE: 0.58 mg/dL (ref 0.44–1.00)
Calcium: 8.9 mg/dL (ref 8.9–10.3)
GFR calc Af Amer: >60 mL/min (ref >60)
GFR calc non Af Amer: >60 mL/min (ref >60)
Glucose, Bld: 83 mg/dL (ref 65–99)
Potassium: 3.4 mmol/L — ABNORMAL LOW (ref 3.5–5.1)
SODIUM: 144 mmol/L (ref 135–145)

## 2014-08-22 MED ORDER — HYDRALAZINE HCL 10 MG PO TABS
10.0000 mg | ORAL_TABLET | Freq: Three times a day (TID) | ORAL | Status: DC
  Administered 2014-08-23 – 2014-08-26 (×7): 10 mg via ORAL
  Filled 2014-08-22 (×14): qty 1

## 2014-08-22 MED ORDER — POTASSIUM CHLORIDE CRYS ER 20 MEQ PO TBCR
40.0000 meq | EXTENDED_RELEASE_TABLET | ORAL | Status: AC
  Filled 2014-08-22: qty 2

## 2014-08-22 NOTE — Progress Notes (Signed)
Progress Note  Kirsten Flores QQP:[id number] DOB: [birth year] DOA: 08/13/2014 PCP: Garnet Koyanagi, DO  Admit HPI / Brief Narrative: 81 yo F w/ Hx dyslipidemia, hypertension, hypothyroidism, morbid obesity, COPD on 4 L O2 at home, OSA/OHS on CPAP at night who presented with sudden onset of shortness of breath associated with cough.  The patient had recently been hospitalized for a COPD exacerbation with acute hypercarbia and discharged to a nursing home.  At the nursing home the patient was reportedly placed on 2L [place] O2 as well as a tapering dose of prednisone. Of note her son felt she had been using 4 L of oxygen at home.  Patient had been more drowsy and on routine evaluation she was found to be saturating 50% on her 2 L of nasal cannula, prompting her transfer to the ED.  In the ED she rapidly improved after initiation of BIPAP.   HPI/Subjective: The patient is awake and alert.  She reports that her shortness of breath is somewhat better but still feels SOB. Unable to speak in full sentences and with positive orthopnea sx's. Denies CP.  Assessment/Plan: Acute on chronic hypoxic and hypercarbic respiratory failure  -multifactorial to include OSA/OHS, polypharmacy, COPD exacerbation, ? sporadic aspiration pneumonitis, and incorrect SNF O2 regimen  -slowly improving -will cont tx for COPD and also continue diuresis, patient still with bibasilar crackles and mild exp wheezing -continue O2 supplementation and CPAP at night  Possible aspiration -SLP eval has cleared her for a regular diet with thin liquids  -will monitor and make sure patient is in upright position when eating  Bilateral pleural effusions right > left -Remains on Lasix daily  -CXR on 6/22 still demonstrating vascular congestion and bilateral pleural effusion  OSA/OHS -Continue CPAP at night for respiratory to maintain SPO2 89-93%  COPD exacerbation -Has completed a seven-day antibiotics course -slight exp wheezing on exam  today; but better than in comparison with 6/21  -continue weaning steroids  -will continue O2 supplementation and pulmicort  Acute on Chronic diastolic congestive heart failure -positive crackles and SOB -positive orthopnea, but less -will continue IV lasix -CXR on 6/22 still demonstrating vascular congestion -follow Is/Os -heart healthy diet -continue oxygen supplementation.  Mildly elevated troponin -Likely related to respiratory distress - peaked at 0.1 - no further workup indicated at this moment -patient not a candidate for any intervention and per discussion with patient just want to improve her breating to go home.  Pulmonary hypertension Continue lasix and start low dose hydralazine  Polypharmacy -Holding all sedating medications, to prevent decrease breathing drive - reduced dose of neurontin   Hypothyroidism. -Continue levothyroxine  -TSH 0.43  Hypernatremia -Present at admission and resolved with initial volume resuscitation -will monitor electrolytes  Hypokalemia -will replete  Code Status: DNR - NO CODE  Family Communication: no family present at time of exam Disposition Plan: continue diuresis, will need SNF at discharge. Titrate Oxygen supplementation if possible.  Consultants: none  Procedures: none  Antibiotics: Zosyn 6/13 > 6/20 Vancomycin 6/13 > 6/19  DVT prophylaxis: Lovenox   Objective: Blood pressure 141/49, pulse 81, temperature 98.9 F (37.2 C), temperature source Oral, resp. rate 18, height 5\' 5"  (1.651 m), weight 89.268 kg (196 lb 12.8 oz), SpO2 90 %.  Intake/Output Summary (Last 24 hours) at 08/22/14 2150 Last data filed at 08/22/14 1352  Gross per 24 hour  Intake    960 ml  Output      0 ml  Net    960 ml  Exam: General: Patient SOB and still wheezing; some improvement but still with difficulties trying to speak in full sentences. Denies CP, nausea and vomiting. Wearing [place] for oxygen supplementation. Lungs: Poor air  movement bilateral bases, fine crackles and mild exp wheezes Cardiovascular: Regular rate and rhythm with 3/6 holosystolic murmur Abdomen: Nontender, nondistended, soft, bowel sounds positive, no rebound, no ascites, no appreciable mass Extremities: No cyanosis, or clubbing, trace edema bilateral lower extremities. Patient with stasis dermatitis from venous insufficiency bilaterally  Data Reviewed: Basic Metabolic Panel:  Recent Labs Lab 08/17/14 0227  08/18/14 0557 08/19/14 0256 08/20/14 0301 08/21/14 0600 08/22/14 0539  NA 144  < > 143 144 143 143 144  K 4.0  < > 3.5 3.4* 3.5 3.7 3.4*  CL 101  < > 99* 97* 95* 95* 92*  CO2 40*  < > 39* 42* 45* 43* 44*  GLUCOSE 99  < > 83 95 76 83 83  BUN 11  < > 10 9 6 7 9   CREATININE 0.58  < > 0.62 0.55 0.63 0.49 0.58  CALCIUM 8.7*  < > 8.9 9.0 8.7* 8.8* 8.9  MG 1.8  --  1.9 1.8 1.6* 2.1  --   < > = values in this interval not displayed. Liver Function Tests:  Recent Labs Lab 08/17/14 0227 08/17/14 1923 08/18/14 0557 08/19/14 0256 08/20/14 0301  AST 24 34 27 28 26   ALT 19 24 22 23 23   ALKPHOS 31* 37* 36* 37* 33*  BILITOT 1.0 0.7 0.8 0.8 0.7  PROT 4.4* 4.6* 4.6* 4.4* 4.5*  ALBUMIN 2.2* 2.3* 2.2* 2.2* 2.2*   CBC:  Recent Labs Lab 08/16/14 0313 08/17/14 0227 08/18/14 0557 08/19/14 0256 08/20/14 0301  WBC 3.2* 2.8* 2.7* 2.6* 2.9*  NEUTROABS  --  1.9 1.7 1.8 2.0  HGB 9.7* 9.6* 9.8* 10.1* 9.5*  HCT 32.0* 31.1* 32.0* 33.4* 30.3*  MCV 103.9* 104.4* 103.6* 104.0* 103.1*  PLT 103* 117* 127* 126* 125*   Cardiac Enzymes:  Recent Labs Lab 08/16/14 0313 08/17/14 1923 08/18/14 0029 08/18/14 0557  TROPONINI 0.08* 0.05* 0.06* 0.06*    Recent Results (from the past 240 hour(s))  Blood culture (routine x 2)     Status: None   Collection Time: 08/13/14  7:56 PM  Result Value Ref Range Status   Specimen Description BLOOD RIGHT ARM  Final   Special Requests   Final    BOTTLES DRAWN AEROBIC AND ANAEROBIC 10CC BLUE, 5CC RED    Culture   Final    NO GROWTH 5 DAYS Performed at [facility]    Report Status 08/20/2014 FINAL  Final  Blood culture (routine x 2)     Status: None   Collection Time: 08/13/14  8:09 PM  Result Value Ref Range Status   Specimen Description BLOOD RIGHT HAND  Final   Special Requests BOTTLES DRAWN AEROBIC ONLY 5CC  Final   Culture   Final    NO GROWTH 5 DAYS Performed at [facility]    Report Status 08/20/2014 FINAL  Final     Studies:  Recent x-ray studies have been reviewed in detail by the Attending Physician  Scheduled Meds:  Scheduled Meds: . antiseptic oral rinse  7 mL Mouth Rinse BID  . atorvastatin  10 mg Oral Daily  . budesonide (PULMICORT) nebulizer solution  0.25 mg Nebulization BID  . clopidogrel  75 mg Oral Q breakfast  . dextromethorphan-guaiFENesin  1 tablet Oral BID  . enoxaparin (LOVENOX) injection  40 mg Subcutaneous Q24H  . feeding supplement (ENSURE ENLIVE)  237 mL Oral BID BM  . furosemide  40 mg Intravenous Daily  . gabapentin  300 mg Oral QHS  . levothyroxine  125 mcg Oral QAC breakfast  . pantoprazole  40 mg Oral Daily  . sertraline  50 mg Oral Daily    Time spent on care of this patient: 35 mins  Barton Dubois [phone]  08/22/2014, 9:50 PM   LOS: 9 days

## 2014-08-22 NOTE — Clinical Social Work Placement (Signed)
   CLINICAL SOCIAL WORK PLACEMENT  NOTE  Date:  08/22/2014  Patient Details  Name: Kirsten Flores MRN: [id number] Date of Birth: 10-Jun-1923  Clinical Social Work is seeking post-discharge placement for this patient at the [place] level of care (*CSW will initial, date and re-position this form in  chart as items are completed):  Yes   Patient/family provided with [facility] Work Department's list of facilities offering this level of care within the geographic area requested by the patient (or if unable, by the patient's family).  Yes   Patient/family informed of their freedom to choose among providers that offer the needed level of care, that participate in Medicare, Medicaid or managed care program needed by the patient, have an available bed and are willing to accept the patient.  Yes   Patient/family informed of [facility]'s ownership interest in [facility] and [facility], as well as of the fact that they are under no obligation to receive care at these facilities.  PASRR submitted to EDS on       PASRR number received on       Existing PASRR number confirmed on 08/21/14     FL2 transmitted to all facilities in geographic area requested by pt/family on 08/21/14     FL2 transmitted to all facilities within larger geographic area on       Patient informed that his/her managed care company has contracts with or will negotiate with certain facilities, including the following:        Yes   Patient/family informed of bed offers received.  Patient chooses bed at       Physician recommends and patient chooses bed at      Patient to be transferred to   on  .  Patient to be transferred to facility by       Patient family notified on   of transfer.  Name of family member notified:        PHYSICIAN       Additional Comment:    _______________________________________________ Rigoberto Noel, LCSW 08/22/2014, 9:44 PM

## 2014-08-23 DIAGNOSIS — E785 Hyperlipidemia, unspecified: Secondary | ICD-10-CM

## 2014-08-23 MED ORDER — FUROSEMIDE 10 MG/ML IJ SOLN
40.0000 mg | Freq: Three times a day (TID) | INTRAMUSCULAR | Status: AC
Start: 2014-08-23 — End: 2014-08-24
  Administered 2014-08-23 – 2014-08-24 (×3): 40 mg via INTRAVENOUS
  Filled 2014-08-23 (×3): qty 4

## 2014-08-23 MED ORDER — POTASSIUM CHLORIDE CRYS ER 20 MEQ PO TBCR
40.0000 meq | EXTENDED_RELEASE_TABLET | ORAL | Status: AC
  Administered 2014-08-23 (×2): 40 meq via ORAL
  Filled 2014-08-23 (×2): qty 2

## 2014-08-23 MED ORDER — BETAXOLOL HCL 0.25 % OP SUSP
1.0000 [drp] | Freq: Two times a day (BID) | OPHTHALMIC | Status: DC
  Administered 2014-08-23 – 2014-08-26 (×6): 1 [drp] via OPHTHALMIC
  Filled 2014-08-23: qty 10

## 2014-08-23 NOTE — Telephone Encounter (Signed)
I tried to reach him earlier this week but was unable. I will close the message and wait for him to call me back

## 2014-08-23 NOTE — Progress Notes (Signed)
Progress Note  Kirsten Flores MWU:[id number] DOB: 1924/02/08 DOA: 08/13/2014 PCP: Garnet Koyanagi, DO  Admit HPI / Brief Narrative: 81 yo F w/ Hx dyslipidemia, hypertension, hypothyroidism, morbid obesity, COPD on 4 L O2 at home, OSA/OHS on CPAP at night who presented with sudden onset of shortness of breath associated with cough.  The patient had recently been hospitalized for a COPD exacerbation with acute hypercarbia and discharged to a nursing home.  At the nursing home the patient was reportedly placed on 2L [place] O2 as well as a tapering dose of prednisone. Of note her son felt she had been using 4 L of oxygen at home.  Patient had been more drowsy and on routine evaluation she was found to be saturating 50% on her 2 L of nasal cannula, prompting her transfer to the ED. In the ED she rapidly improved after initiation of BIPAP.  Patient continue to struggle with her breathing and at this moment main concern is for acute on chronic diastolic heart failure. The flat of fluid overload signs on physical exam and crackles on auscultation of her lungs.   HPI/Subjective: Short of breath at rest with positive orthopnea and crackles in her lungs. No wheezing unable to almost completely speak in full sentences. She denies chest pain; is afebrile and is just very weak and deconditioned  Assessment/Plan: Acute on chronic hypoxic and hypercarbic respiratory failure  -multifactorial to include OSA/OHS, polypharmacy, COPD exacerbation, acute on chronic diastolic heart failure? sporadic aspiration pneumonitis, and incorrect SNF O2 regimen  -slowly improving -will cont maintenance tx for COPD and also continue diuresis, patient still with bibasilar crackles. No active wheezing at this moment. -continue O2 supplementation and CPAP at night -Will continue Pulmicort -Lasix 40 mg IV 3 doses with subsequent plan of discharge patient on Lasix 30 mg by mouth daily  Possible aspiration -SLP eval has cleared her for a  regular diet with thin liquids  -will monitor and make sure patient is in upright position when eating  Acute on chronic diastolic heart failure and Bilateral pleural effusions right > left -Remains on Lasix, will give 40 mg IV X 3 doses -CXR on 6/22 still demonstrating vascular congestion and bilateral pleural effusion -reassess volume status on 6/24 -will ask CHF service to help [place] assessing amount of fluids in her lungs.  OSA/OHS -Continue CPAP at night for respiratory to maintain SPO2 89-93%  COPD exacerbation -Has completed a seven-day antibiotics course -slight exp wheezing on exam today; but better than in comparison with 6/21  -continue weaning steroids  -will continue O2 supplementation and pulmicort  Acute on Chronic diastolic congestive heart failure continue to have positive crackles and SOB -positive orthopnea, but less -will continue IV lasix -CXR on 6/22 still demonstrating vascular congestion -follow Is/Os -heart healthy diet -continue oxygen supplementation.  Mildly elevated troponin -Likely related to respiratory distress - peaked at 0.1 - no further workup indicated at this moment -patient not a candidate for any intervention and per discussion with patient just want to improve her breating to go home.  Pulmonary hypertension Continue lasix and low dose hydralazine BP is tolerating therapy well  Polypharmacy -Holding all sedating medications, to prevent decrease breathing drive -reduced dose of neurontin   Hypothyroidism. -Continue levothyroxine  -TSH 0.43  Hypernatremia -Present at admission and resolved with initial volume resuscitation -will monitor electrolytes  Hypokalemia -will replete  Code Status: DNR - NO CODE  Family Communication: no family present at time of exam Disposition Plan: continue diuresis, will  need SNF at discharge. Titrate Oxygen supplementation if possible.  Consultants: none  Procedures: none  Antibiotics: Zosyn  6/13 > 6/20 Vancomycin 6/13 > 6/19  DVT prophylaxis: Lovenox   Objective: Blood pressure 140/49, pulse 91, temperature 98.5 F (36.9 C), temperature source Oral, resp. rate 22, height 5\' 5"  (1.651 m), weight 89.812 kg (198 lb), SpO2 93 %.  Intake/Output Summary (Last 24 hours) at 08/23/14 1724 Last data filed at 08/23/14 1430  Gross per 24 hour  Intake    240 ml  Output      0 ml  Net    240 ml   Exam: General: Patient SOB and still with crackles on exam. Denies CP, nausea and vomiting. Wearing [place] for oxygen supplementation. Lungs: Poor air movement bilateral bases, fine crackles and mild exp wheezes Cardiovascular: Regular rate and rhythm with 3/6 holosystolic murmur Abdomen: Nontender, nondistended, soft, bowel sounds positive, no rebound, no ascites, no appreciable mass Extremities: No cyanosis, or clubbing, trace edema bilateral lower extremities. Patient with stasis dermatitis from venous insufficiency bilaterally. Positive right leg skin tear  Data Reviewed: Basic Metabolic Panel:  Recent Labs Lab 08/17/14 0227  08/18/14 0557 08/19/14 0256 08/20/14 0301 08/21/14 0600 08/22/14 0539  NA 144  < > 143 144 143 143 144  K 4.0  < > 3.5 3.4* 3.5 3.7 3.4*  CL 101  < > 99* 97* 95* 95* 92*  CO2 40*  < > 39* 42* 45* 43* 44*  GLUCOSE 99  < > 83 95 76 83 83  BUN 11  < > 10 9 6 7 9   CREATININE 0.58  < > 0.62 0.55 0.63 0.49 0.58  CALCIUM 8.7*  < > 8.9 9.0 8.7* 8.8* 8.9  MG 1.8  --  1.9 1.8 1.6* 2.1  --   < > = values in this interval not displayed. Liver Function Tests:  Recent Labs Lab 08/17/14 0227 08/17/14 1923 08/18/14 0557 08/19/14 0256 08/20/14 0301  AST 24 34 27 28 26   ALT 19 24 22 23 23   ALKPHOS 31* 37* 36* 37* 33*  BILITOT 1.0 0.7 0.8 0.8 0.7  PROT 4.4* 4.6* 4.6* 4.4* 4.5*  ALBUMIN 2.2* 2.3* 2.2* 2.2* 2.2*   CBC:  Recent Labs Lab 08/17/14 0227 08/18/14 0557 08/19/14 0256 08/20/14 0301  WBC 2.8* 2.7* 2.6* 2.9*  NEUTROABS 1.9 1.7 1.8 2.0  HGB 9.6*  9.8* 10.1* 9.5*  HCT 31.1* 32.0* 33.4* 30.3*  MCV 104.4* 103.6* 104.0* 103.1*  PLT 117* 127* 126* 125*   Cardiac Enzymes:  Recent Labs Lab 08/17/14 1923 08/18/14 0029 08/18/14 0557  TROPONINI 0.05* 0.06* 0.06*    Recent Results (from the past 240 hour(s))  Blood culture (routine x 2)     Status: None   Collection Time: 08/13/14  7:56 PM  Result Value Ref Range Status   Specimen Description BLOOD RIGHT ARM  Final   Special Requests   Final    BOTTLES DRAWN AEROBIC AND ANAEROBIC 10CC BLUE, 5CC RED   Culture   Final    NO GROWTH 5 DAYS Performed at [facility]    Report Status 08/20/2014 FINAL  Final  Blood culture (routine x 2)     Status: None   Collection Time: 08/13/14  8:09 PM  Result Value Ref Range Status   Specimen Description BLOOD RIGHT HAND  Final   Special Requests BOTTLES DRAWN AEROBIC ONLY 5CC  Final   Culture   Final    NO GROWTH 5 DAYS Performed  at [facility]    Report Status 08/20/2014 FINAL  Final     Studies:  Recent x-ray studies have been reviewed in detail by the Attending Physician  Scheduled Meds:  Scheduled Meds: . antiseptic oral rinse  7 mL Mouth Rinse BID  . atorvastatin  10 mg Oral Daily  . betaxolol  1 drop Both Eyes BID  . budesonide (PULMICORT) nebulizer solution  0.25 mg Nebulization BID  . clopidogrel  75 mg Oral Q breakfast  . dextromethorphan-guaiFENesin  1 tablet Oral BID  . enoxaparin (LOVENOX) injection  40 mg Subcutaneous Q24H  . feeding supplement (ENSURE ENLIVE)  237 mL Oral BID BM  . furosemide  40 mg Intravenous 3 times per day  . gabapentin  300 mg Oral QHS  . hydrALAZINE  10 mg Oral 3 times per day  . levothyroxine  125 mcg Oral QAC breakfast  . pantoprazole  40 mg Oral Daily  . potassium chloride  40 mEq Oral Q4H  . sertraline  50 mg Oral Daily    Time spent on care of this patient: 35 mins  Barton Dubois [phone]  08/23/2014, 5:24 PM   LOS: 10 days

## 2014-08-23 NOTE — Clinical Social Work Note (Signed)
Clinical Social Worker continuing to follow patient and family for support and discharge planning needs.  CSW spoke with patient son over the phone who states that they have chosen [facility] in the event that [facility] does not have a bed available at discharge.  CSW contacted [facility] who does not for see bed availability in the near future.  CSW also contacted [facility] who will be able to meet patient needs.  Patient son updated and agreeable.  CSW initiated authorization for SNF placement with [facility].  CSW remains available for support and to facilitate patient discharge needs once medically stable.  Barbette Or, [place]

## 2014-08-23 NOTE — Progress Notes (Signed)
Physical Therapy Treatment Patient Details Name: Kirsten Flores MRN: [id number] DOB: 1923/07/05 Today's Date: 08/23/2014    History of Present Illness Pt with h/o mixed COPD (on home O2- 4L), hypothyroid, HTN, and memory loss.Patient admitted with respiratory failure from [facility] with plan to return     PT Comments    Pt able to tolerate much more activity today.  She was SOB on 4 L/min, but o2 sats 90-92%.  Pt able to sit EOB with min/guard A and stand at [facility], but not fully upright and unable to get both sitting pads behind her.  Pt with skin tear on R shin from feet sliding forward on [place].  Pt more alert and did not seem as confused today.  Con't to recommend SNF.  Follow Up Recommendations  SNF;Supervision/Assistance - 24 hour     Equipment Recommendations  None recommended by PT    Recommendations for Other Services       Precautions / Restrictions Precautions Precautions: Fall Precaution Comments: watch sats, decreased skin integrity Restrictions Weight Bearing Restrictions: No    Mobility  Bed Mobility Overal bed mobility: Needs Assistance Bed Mobility: Supine to Sit     Supine to sit: Max assist;+2 for physical assistance     General bed mobility comments: Heavy use of bed pad, but pt attempting to help with use of rail. O2 sats 91% with mobility.  Transfers Overall transfer level: Needs assistance Equipment used:  Charlaine Dalton) Transfers: Sit to/from Stand Sit to Stand: Max assist;+2 physical assistance;From elevated surface         General transfer comment: Pt stood to [place] and able to get R  sitting pad down, but not Left one due to posture.  Pt complaining of leg pain and noted feet had shifted forward on base of stedy.  R sitting pad moved and lowered pt back to sitting EOB and noted blood dripping down R leg.  Pt with skin tear at old blood blister.  Worked on scooting backwards which pt could do minimal scoot and then returned supine with MAX of 2.   Scooted to [facility] with MAX of 2 .  o2 sats 92% although SOB.  Nursing in to check on skin tear and dressed withe tegaderm,  Ambulation/Gait                 Stairs            Wheelchair Mobility    Modified Rankin (Stroke Patients Only)       Balance   Sitting-balance support: Feet supported;Bilateral upper extremity supported Sitting balance-Leahy Scale: Fair Sitting balance - Comments: Pt able to sit EOB on 4 L/min with o2 90-92%, but SOB noted.     Standing balance-Leahy Scale: Zero Standing balance comment: Used stedy and unable to get L side extended enough to get pad underneath.                    Cognition Arousal/Alertness: Awake/alert Behavior During Therapy: WFL for tasks assessed/performed Overall Cognitive Status: No family/caregiver present to determine baseline cognitive functioning                      Exercises      General Comments General comments (skin integrity, edema, etc.): fragile skin- see transfer comments      Pertinent Vitals/Pain Pain Assessment: 0-10 Pain Score: 5  Pain Location: R shin Pain Descriptors / Indicators: Sore Pain Intervention(s): Repositioned    Home Living  Prior Function            PT Goals (current goals can now be found in the care plan section) Acute Rehab PT Goals Patient Stated Goal: to return to [place] and walk there PT Goal Formulation: With patient Time For Goal Achievement: 08/31/14 Potential to Achieve Goals: Fair Progress towards PT goals: Progressing toward goals    Frequency  Min 2X/week    PT Plan Current plan remains appropriate    Co-evaluation             End of Session Equipment Utilized During Treatment: Oxygen Activity Tolerance: Treatment limited secondary to medical complications (Comment) (skin tear) Patient left: in bed;with call bell/phone within reach     Time: 3361-2244 PT Time Calculation (min) (ACUTE ONLY): 35  min  Charges:  $Therapeutic Activity: 23-37 mins                    G Codes:      Kirsten Flores 08/23/2014, 2:03 PM

## 2014-08-23 NOTE — Progress Notes (Signed)
Utilization Review completed. Encarnacion Bole RN BSN CM 

## 2014-08-24 DIAGNOSIS — Z515 Encounter for palliative care: Secondary | ICD-10-CM

## 2014-08-24 DIAGNOSIS — J441 Chronic obstructive pulmonary disease with (acute) exacerbation: Principal | ICD-10-CM

## 2014-08-24 DIAGNOSIS — E662 Morbid (severe) obesity with alveolar hypoventilation: Secondary | ICD-10-CM

## 2014-08-24 DIAGNOSIS — J9622 Acute and chronic respiratory failure with hypercapnia: Secondary | ICD-10-CM

## 2014-08-24 DIAGNOSIS — J9621 Acute and chronic respiratory failure with hypoxia: Secondary | ICD-10-CM

## 2014-08-24 LAB — BASIC METABOLIC PANEL
ANION GAP: 11 (ref 5–15)
BUN: 10 mg/dL (ref 6–20)
CHLORIDE: 96 mmol/L — AB (ref 101–111)
CO2: 37 mmol/L — AB (ref 22–32)
CREATININE: 0.66 mg/dL (ref 0.44–1.00)
Calcium: 9.3 mg/dL (ref 8.9–10.3)
GFR calc Af Amer: >60 mL/min (ref >60)
GFR calc non Af Amer: >60 mL/min (ref >60)
Glucose, Bld: 86 mg/dL (ref 65–99)
Potassium: 3.8 mmol/L (ref 3.5–5.1)
Sodium: 144 mmol/L (ref 135–145)

## 2014-08-24 NOTE — Consult Note (Signed)
Consultation Note Date: 08/24/2014   Patient Name: Kirsten Flores  DOB: 27-Feb-1924  MRN: [id number]  Age / Sex: 81 y.o., female   PCP: Rosalita Chessman, DO Referring Physician: Geradine Girt, DO  Reason for Consultation: Establishing goals of care  Palliative Care Assessment and Plan Summary of Established Goals of Care and Medical Treatment Preferences   Clinical Assessment/Narrative: Pt is a 81 yo female with h/o HLD, HTN, COPD on 4L, OSA on CPAP, diastolic heart failure, admitted with worsening shortness of breath and cough. CXR effusions consistent with CHF. She is still on IV lasix. This is her 3rd hospitalization in 6 months . She has been progressively declining in functional status precipitating SNF admission 07/26/14 for rehab. She states she feels that rehab helped her before and recognizes that that is the discharge plan after this hospitalization. She is SHOB at rest, and has purse-lipped breathing. She moans with each exhalation and exhibits 3-5 word dyspnea. She is able to answer brief questions but often answers "I just don't know". States she has a son in town and a daughter who lives in [place] her son since he is local helps her the most but that both children make decisions jointly in regards to her healthcare. She does reference her DNR status but when I attempted to elicit other goals she is unable to articulate what her wishes would be, for example rehospitalization and plans given declining health. She states her health is decline, that she is tired. She required a hoyer lift to get her OOB yesterday  Contacts/Participants in Discussion: Primary Decision Maker: son, Kirsten Flores and daughter who lives in [place]: no  Left telephone message for son. Pt states "he is hard to get in touch with by phone". He is a Biomedical scientist at [employer]:  DNR  Overall plan, e.g. Returning to hospital, hospice: Pt unable to articulate her wishes at this  time. She is somnolent and still very dyspneic. She has tolerated the hospital and on one level feels that it has helped. She does report she is tired but I do not get the sense that she has considered there are other ways to manage worsening symptoms, particularly dyspnea , other than to return to the hospital. Stated she will have to talk to her children about this. Her ability to rehab after this hospitalization maybe limited given her dyspnea  Symptom Management:   Dyspnea; Discussed the role of low-dose opioids in addition to targeted pulmonary interventions as well as lasix to improve dyspnea. Pt very reluctant to try this for fear of addition. I explained in depth what addiction is and she would be at very low risk for misuse or true addiction. I do think morphine concentrate 2.5 mg -5 mg q 4 prn would be beneficial but reluctant to write for it at this point given her reluctance. Will continue to try and involve son in [place] as well as symptom mgt conversations. Will also FU this weekend and provide additional education regarding risks and benefits on low dose opioids in the treatment of dyspnea. Establishing a relationship with pt is key going forward  Palliative Prophylaxis: Bowel regimen if opioids initiated  Additional Recommendations (Limitations, Scope, Preferences):  Improved symptom mgt in terms of dyspnea wold be beneficial in terms of her not having to endure another hospitalization, but as noted will need children's input to facilitate these goals furher Psycho-social/Spiritual:   Support System: childen  Desire for  further Chaplaincy support:no  Prognosis: > 6 - 12 months. I did not share this with pt.   Discharge Planning:  [facility] for rehab with Palliative care service follow-up       Chief Complaint/History of Present Illness: Pt is a 81 yo female with COPD, HLD, HTN, OSA on CPAP and diastolic heart failure admitted with worsening dyspnea and cough. She  has bilateral pleural effusions  indicative of CHF exacerbation. She is dyspneic at rest. Unable to get OOB without hoyer lift. Prior to this hospitalization was using a walker.  Primary Diagnoses  Present on Admission:  . Acute and chronic respiratory failure (acute-on-chronic) . COPD exacerbation . Dyslipidemia . Essential hypertension . Hypothyroidism . Severe obesity (BMI >= 40) . Diastolic dysfunction with chronic heart failure . Elevated troponin . HCAP (healthcare-associated pneumonia) . Altered mental status . Aspiration into airway . Elevated brain natriuretic peptide (BNP) level . Chronic diastolic CHF (congestive heart failure) . Pulmonary hypertension . Polypharmacy . Other specified hypothyroidism . Acute on chronic respiratory failure with hypoxia . OSA (obstructive sleep apnea) . Hypokalemia . Respiratory acidosis . Obesity hypoventilation syndrome . Pleural effusion, bilateral  Palliative Review of Systems: She is somnolent. Can only answer brief questions before drifting off to sleep. She denies pain and does acknowledge she is [facility]  I have reviewed the medical record, interviewed the patient and family, and examined the patient. The following aspects are pertinent.  Past Medical History  Diagnosis Date  . Hyperlipidemia   . Hypertension   . Osteopenia   . Thyroid disease   . B12 deficiency   . Arthritis   . Memory loss   . Knee pain     RIGHT  . Gait disturbance    History   Social History  . Marital Status: Widowed    Spouse Name: N/A  . Number of Children: N/A  . Years of Education: N/A   Occupational History  . RETIRED NURSE    Social History Main Topics  . Smoking status: Former Smoker -- 2.00 packs/day for 25 years    Types: Cigarettes    Quit date: 03/03/1971  . Smokeless tobacco: Never Used  . Alcohol Use: Yes     Comment: 1 cocktail in the evening and a glass of wine - not everyday but doesn't state how often  . Drug Use: No  .  Sexual Activity: Not Currently    Birth Control/ Protection: Post-menopausal   Other Topics Concern  . None   Social History Narrative   HUSBAND DIED IN 01/03/10   Family History  Problem Relation Age of Onset  . Colon cancer    . Melanoma    . Cancer Other     COLON...1ST DEGREE RELATIVE   Scheduled Meds: . antiseptic oral rinse  7 mL Mouth Rinse BID  . atorvastatin  10 mg Oral Daily  . betaxolol  1 drop Both Eyes BID  . budesonide (PULMICORT) nebulizer solution  0.25 mg Nebulization BID  . clopidogrel  75 mg Oral Q breakfast  . dextromethorphan-guaiFENesin  1 tablet Oral BID  . enoxaparin (LOVENOX) injection  40 mg Subcutaneous Q24H  . feeding supplement (ENSURE ENLIVE)  237 mL Oral BID BM  . furosemide  40 mg Intravenous 3 times per day  . gabapentin  300 mg Oral QHS  . hydrALAZINE  10 mg Oral 3 times per day  . levothyroxine  125 mcg Oral QAC breakfast  . pantoprazole  40 mg Oral Daily  .  sertraline  50 mg Oral Daily   Continuous Infusions:  PRN Meds:.ipratropium-albuterol Medications Prior to Admission:  Prior to Admission medications   Medication Sig Start Date End Date Taking? Authorizing Provider  acetaminophen (TYLENOL ARTHRITIS PAIN) 650 MG CR tablet Take 1,300 mg by mouth at bedtime as needed for pain.    Yes Historical Provider, MD  atorvastatin (LIPITOR) 10 MG tablet Take 1 tablet (10 mg total) by mouth daily. 05/28/14  Yes Yvonne R Lowne, DO  betaxolol (BETOPTIC-S) 0.25 % ophthalmic suspension Place 1 drop into both eyes 2 (two) times daily.     Yes Historical Provider, MD  Cholecalciferol (VITAMIN D PO) Take 1 tablet by mouth daily.   Yes Historical Provider, MD  clopidogrel (PLAVIX) 75 MG tablet Take 1 tablet (75 mg total) by mouth daily with breakfast. 08/07/13  Yes Rosalita Chessman, DO  feeding supplement, ENSURE ENLIVE, (ENSURE ENLIVE) LIQD Take 237 mLs by mouth 2 (two) times daily between meals. 07/14/14  Yes Robbie Lis, MD  fenofibrate (TRICOR) 145 MG  tablet TAKE 1 BY MOUTH DAILY 06/25/14  Yes Alferd Apa Lowne, DO  gabapentin (NEURONTIN) 600 MG tablet Take 1 tablet (600 mg total) by mouth at bedtime. 10/24/13  Yes Yvonne R Lowne, DO  levofloxacin (LEVAQUIN) 500 MG tablet Take 1 tablet (500 mg total) by mouth daily. 07/27/14  Yes Velvet Bathe, MD  levothyroxine (SYNTHROID, LEVOTHROID) 125 MCG tablet Take 125 mcg by mouth daily before breakfast.   Yes Historical Provider, MD  Multiple Vitamin (MULTIVITAMIN WITH MINERALS) TABS tablet Take 1 tablet by mouth daily.   Yes Historical Provider, MD  pantoprazole (PROTONIX) 40 MG tablet Take 1 tablet (40 mg total) by mouth daily. 10/24/13  Yes Rosalita Chessman, DO  predniSONE (DELTASONE) 50 MG tablet Take one tablet by mouth daily 07/27/14  Yes Velvet Bathe, MD  sertraline (ZOLOFT) 50 MG tablet Take 1 tablet (50 mg total) by mouth daily. 10/24/13  Yes Rosalita Chessman, DO  levothyroxine (SYNTHROID, LEVOTHROID) 137 MCG tablet Take 1 tablet (137 mcg total) by mouth daily. Patient not taking: Reported on 08/13/2014 07/14/14   Robbie Lis, MD  nystatin (MYCOSTATIN) 100000 UNIT/ML suspension Take 5 mLs (500,000 Units total) by mouth 4 (four) times daily. Patient not taking: Reported on 08/13/2014 07/19/14   Rosalita Chessman, DO   Allergies  Allergen Reactions  . Diltiazem Hcl Other (See Comments)    Unknown reaction  . Neomycin Other (See Comments)    Unknown reaction (a long time ago)   CBC:    Component Value Date/Time   WBC 2.9* 08/20/2014 0301   HGB 9.5* 08/20/2014 0301   HCT 30.3* 08/20/2014 0301   PLT 125* 08/20/2014 0301   MCV 103.1* 08/20/2014 0301   NEUTROABS 2.0 08/20/2014 0301   LYMPHSABS 0.5* 08/20/2014 0301   MONOABS 0.3 08/20/2014 0301   EOSABS 0.2 08/20/2014 0301   BASOSABS 0.0 08/20/2014 0301   Comprehensive Metabolic Panel:    Component Value Date/Time   NA 144 08/24/2014 0529   K 3.8 08/24/2014 0529   CL 96* 08/24/2014 0529   CO2 37* 08/24/2014 0529   BUN 10 08/24/2014 0529    CREATININE 0.66 08/24/2014 0529   GLUCOSE 86 08/24/2014 0529   CALCIUM 9.3 08/24/2014 0529   AST 26 08/20/2014 0301   ALT 23 08/20/2014 0301   ALKPHOS 33* 08/20/2014 0301   BILITOT 0.7 08/20/2014 0301   PROT 4.5* 08/20/2014 0301   ALBUMIN 2.2* 08/20/2014 0301  Physical Exam: Vital Signs: BP 139/43 mmHg  Pulse 75  Temp(Src) 98.2 F (36.8 C) (Oral)  Resp 24  Ht 5\' 5"  (1.651 m)  Wt 87.907 kg (193 lb 12.8 oz)  BMI 32.25 kg/m2  SpO2 97% SpO2: SpO2: 97 % O2 Device: O2 Device: Nasal Cannula O2 Flow Rate: O2 Flow Rate (L/min): 3 L/min Intake/output summary:  Intake/Output Summary (Last 24 hours) at 08/24/14 1326 Last data filed at 08/24/14 0786  Gross per 24 hour  Intake    717 ml  Output      0 ml  Net    717 ml   LBM: Last BM Date: 08/22/14 Baseline Weight: Weight: 91.2 kg (201 lb 1 oz) Most recent weight: Weight: 87.907 kg (193 lb 12.8 oz)  Exam Findings:  General : Well nourished female. She is somnolent Resp: Bilat rales. Work of breathing noted at rest. Purse-lipped breathing. Moans with each exhalation Cardiac: Regular 3/6 murmer Musculoskeletal: Can MAE x 4 . Right foot is adducted Neuro: Somnolent. Answers only brief question         Palliative Performance Scale: 50%             Additional Data Reviewed: Recent Labs     08/22/14  0539  08/24/14  0529  NA  144  144  BUN  9  10  CREATININE  0.58  0.66      Time In: 1200 Time Out: 1300 Time Total: 60 min Greater than 50%  of this time was spent counseling and coordinating care related to the above assessment and plan. Left message with son. Will follow over the weekend to try and engage family in further discussions about what they may expect going forward with worseing dyspnea in the setting of CHF and COPD and declining functional status.  Signed by: Dory Horn, NP  Dory Horn, NP  08/24/2014, 1:26 PM  Please contact Palliative Medicine Team phone at [phone] for questions and  concerns.

## 2014-08-24 NOTE — Clinical Social Work Note (Signed)
Authorization from [facility] received. Auth: [id number], RUG: RUB, next review date of 08/27/14. Patient can admit to [facility] once discharged.   Liz Beach MSW, [place], [place], [phone]

## 2014-08-24 NOTE — Progress Notes (Signed)
Patient refused to be placed back on CPAP for the night. Will continue to monitor patient

## 2014-08-24 NOTE — Progress Notes (Signed)
Progress Note  Kirsten Flores BSJ:[id number] DOB: 22-Jan-1924 DOA: 08/13/2014 PCP: Garnet Koyanagi, DO  Admit HPI / Brief Narrative: 81 yo F w/ Hx dyslipidemia, hypertension, hypothyroidism, morbid obesity, COPD on 4 L O2 at home, OSA/OHS on CPAP at night who presented with sudden onset of shortness of breath associated with cough.  The patient had recently been hospitalized for a COPD exacerbation with acute hypercarbia and discharged to a nursing home.  At the nursing home the patient was reportedly placed on 2L [place] O2 as well as a tapering dose of prednisone. Of note her son felt she had been using 4 L of oxygen at home.  Patient had been more drowsy and on routine evaluation she was found to be saturating 50% on her 2 L of nasal cannula, prompting her transfer to the ED. In the ED she rapidly improved after initiation of BIPAP.  Patient continue to struggle with her breathing and at this moment main concern is for acute on chronic diastolic heart failure. The flat of fluid overload signs on physical exam and crackles on auscultation of her lungs.   HPI/Subjective: Appears very SOB, only able to speak a few words  Assessment/Plan: Acute on chronic hypoxic and hypercarbic respiratory failure  -multifactorial to include OSA/OHS, polypharmacy, COPD exacerbation, acute on chronic diastolic heart failure? sporadic aspiration pneumonitis, and incorrect SNF O2 regimen  -slowly improving -will cont maintenance tx for COPD and also continue diuresis, patient still with bibasilar crackles. No active wheezing at this moment. -continue O2 supplementation and CPAP at night -Will continue Pulmicort -Lasix 40 mg IV 3 doses with subsequent plan of discharge patient on Lasix 30 mg by mouth daily?  Possible aspiration -SLP eval has cleared her for a regular diet with thin liquids  -will monitor and make sure patient is in upright position when eating  Acute on chronic diastolic heart failure and Bilateral  pleural effusions right > left -Remains on Lasix, will give 40 mg IV X 3 doses -CXR on 6/22 still demonstrating vascular congestion and bilateral pleural effusion -reassess volume status on 6/24  OSA/OHS -Continue CPAP at night for respiratory to maintain SPO2 89-93%  COPD exacerbation -Has completed a seven-day antibiotics course -slight exp wheezing on exam today; but better than in comparison with 6/21  -continue weaning steroids  -will continue O2 supplementation and pulmicort  Acute on Chronic diastolic congestive heart failure continue to have positive crackles and SOB -positive orthopnea, but less -will continue IV lasix -CXR on 6/22 still demonstrating vascular congestion -follow Is/Os -heart healthy diet -continue oxygen supplementation.  Mildly elevated troponin -Likely related to respiratory distress - peaked at 0.1 - no further workup indicated at this moment -patient not a candidate for any intervention and per discussion with patient just want to improve her breating to go home.  Pulmonary hypertension Continue lasix and low dose hydralazine BP is tolerating therapy well  Polypharmacy -Holding all sedating medications, to prevent decrease breathing drive -reduced dose of neurontin   Hypothyroidism. -Continue levothyroxine  -TSH 0.43  Hypernatremia -Present at admission and resolved with initial volume resuscitation -will monitor electrolytes  Hypokalemia -will replete  Code Status: DNR - NO CODE  Family Communication: no family present at time of exam Disposition Plan: SNF?/ - will ask for palliative care evaluation as poor overall prognosis- not sure on end point?  Consultants: none  Procedures: none  Antibiotics: Zosyn 6/13 > 6/20 Vancomycin 6/13 > 6/19  DVT prophylaxis: Lovenox   Objective: Blood pressure 139/43,  pulse 75, temperature 98.2 F (36.8 C), temperature source Oral, resp. rate 24, height 5\' 5"  (1.651 m), weight 87.907 kg  (193 lb 12.8 oz), SpO2 97 %.  Intake/Output Summary (Last 24 hours) at 08/24/14 1157 Last data filed at 08/24/14 5027  Gross per 24 hour  Intake    717 ml  Output      0 ml  Net    717 ml   Exam: General: will awaken but falls back asleep Lungs: Poor air movement bilateral bases, fine crackles and mild exp wheezes Cardiovascular: Regular rate and rhythm with 3/6 holosystolic murmur Abdomen: Nontender, nondistended, soft, bowel sounds positive, no rebound, no ascites, no appreciable mass Extremities: stasis dermatitis from venous insufficiency bilaterally. Positive right leg skin tear  Data Reviewed: Basic Metabolic Panel:  Recent Labs Lab 08/18/14 0557 08/19/14 0256 08/20/14 0301 08/21/14 0600 08/22/14 0539 08/24/14 0529  NA 143 144 143 143 144 144  K 3.5 3.4* 3.5 3.7 3.4* 3.8  CL 99* 97* 95* 95* 92* 96*  CO2 39* 42* 45* 43* 44* 37*  GLUCOSE 83 95 76 83 83 86  BUN 10 9 6 7 9 10   CREATININE 0.62 0.55 0.63 0.49 0.58 0.66  CALCIUM 8.9 9.0 8.7* 8.8* 8.9 9.3  MG 1.9 1.8 1.6* 2.1  --   --    Liver Function Tests:  Recent Labs Lab 08/17/14 1923 08/18/14 0557 08/19/14 0256 08/20/14 0301  AST 34 27 28 26   ALT 24 22 23 23   ALKPHOS 37* 36* 37* 33*  BILITOT 0.7 0.8 0.8 0.7  PROT 4.6* 4.6* 4.4* 4.5*  ALBUMIN 2.3* 2.2* 2.2* 2.2*   CBC:  Recent Labs Lab 08/18/14 0557 08/19/14 0256 08/20/14 0301  WBC 2.7* 2.6* 2.9*  NEUTROABS 1.7 1.8 2.0  HGB 9.8* 10.1* 9.5*  HCT 32.0* 33.4* 30.3*  MCV 103.6* 104.0* 103.1*  PLT 127* 126* 125*   Cardiac Enzymes:  Recent Labs Lab 08/17/14 1923 08/18/14 0029 08/18/14 0557  TROPONINI 0.05* 0.06* 0.06*    No results found for this or any previous visit (from the past 240 hour(s)).    Scheduled Meds:  Scheduled Meds: . antiseptic oral rinse  7 mL Mouth Rinse BID  . atorvastatin  10 mg Oral Daily  . betaxolol  1 drop Both Eyes BID  . budesonide (PULMICORT) nebulizer solution  0.25 mg Nebulization BID  . clopidogrel  75  mg Oral Q breakfast  . dextromethorphan-guaiFENesin  1 tablet Oral BID  . enoxaparin (LOVENOX) injection  40 mg Subcutaneous Q24H  . feeding supplement (ENSURE ENLIVE)  237 mL Oral BID BM  . furosemide  40 mg Intravenous 3 times per day  . gabapentin  300 mg Oral QHS  . hydrALAZINE  10 mg Oral 3 times per day  . levothyroxine  125 mcg Oral QAC breakfast  . pantoprazole  40 mg Oral Daily  . sertraline  50 mg Oral Daily    Time spent on care of this patient: 35 mins  Yailyn Strack [phone]  08/24/2014, 11:57 AM   LOS: 11 days

## 2014-08-24 NOTE — Progress Notes (Signed)
Pt refuses to wear CPAP for the night. Pt resting well. No distress noted.

## 2014-08-25 DIAGNOSIS — I5032 Chronic diastolic (congestive) heart failure: Secondary | ICD-10-CM

## 2014-08-25 DIAGNOSIS — E039 Hypothyroidism, unspecified: Secondary | ICD-10-CM

## 2014-08-25 DIAGNOSIS — R0602 Shortness of breath: Secondary | ICD-10-CM | POA: Insufficient documentation

## 2014-08-25 MED ORDER — FUROSEMIDE 40 MG PO TABS
40.0000 mg | ORAL_TABLET | Freq: Every day | ORAL | Status: DC
  Administered 2014-08-25 – 2014-08-26 (×2): 40 mg via ORAL
  Filled 2014-08-25 (×2): qty 1

## 2014-08-25 NOTE — Progress Notes (Signed)
Pt refused CPAP for tonight. Pt states she has never worn CPAP before and prefers to wear her nasal cannula instead. Pt remains on 4Lpm per home regimen. RT will continue to monitor as needed.

## 2014-08-25 NOTE — Progress Notes (Signed)
Daily Progress Note   Patient Name: Kirsten Flores       Date: 08/25/2014 DOB: May 14, 1923  Age: 81 y.o. MRN#: [id number] Attending Physician: Geradine Girt, DO Primary Care Physician: Garnet Koyanagi, DO Admit Date: 08/13/2014  Reason for Consultation/Follow-up: Establishing goals of care  Subjective: Pt is a 81 yo female  Readmitted with acute on chronic respiratory failure. She was hypercarbic on admission. She is somnolent this am but does awaken easily and can answer some brief question. She refused her CPAP again last night. Mild respiratory distress at rest. She herself states " its not too bad" She denies pain.  Interval Events: Met with son late 08/24/14 Spoke at length with pt's daughter, Cyndie Chime at [phone] Length of Stay: 12 days  Current Medications: Scheduled Meds:  . antiseptic oral rinse  7 mL Mouth Rinse BID  . atorvastatin  10 mg Oral Daily  . betaxolol  1 drop Both Eyes BID  . budesonide (PULMICORT) nebulizer solution  0.25 mg Nebulization BID  . clopidogrel  75 mg Oral Q breakfast  . dextromethorphan-guaiFENesin  1 tablet Oral BID  . enoxaparin (LOVENOX) injection  40 mg Subcutaneous Q24H  . feeding supplement (ENSURE ENLIVE)  237 mL Oral BID BM  . gabapentin  300 mg Oral QHS  . hydrALAZINE  10 mg Oral 3 times per day  . levothyroxine  125 mcg Oral QAC breakfast  . pantoprazole  40 mg Oral Daily  . sertraline  50 mg Oral Daily    Continuous Infusions:    PRN Meds: ipratropium-albuterol  Palliative Performance Scale: 40-50%     Vital Signs: BP 126/34 mmHg  Pulse 75  Temp(Src) 98.5 F (36.9 C) (Oral)  Resp 20  Ht $R'5\' 5"'cb$  (1.651 m)  Wt 87.907 kg (193 lb 12.8 oz)  BMI 32.25 kg/m2  SpO2 95% SpO2: SpO2: 95 % O2 Device: O2 Device: Nasal Cannula O2 Flow Rate: O2 Flow Rate (L/min): 3.5 L/min  Intake/output summary:  Intake/Output Summary (Last 24 hours) at 08/25/14 1022 Last data filed at 08/25/14 1012  Gross per 24 hour  Intake    720 ml    Output      0 ml  Net    720 ml   LBM:   Baseline Weight: Weight: 91.2 kg (201 lb 1 oz) Most recent weight: Weight: 87.907 kg (193 lb 12.8 oz)  Physical Exam: General: Well nourished elderly female. Somnolent Resp: bilat crackles Cardiac:  RRR, 3/6 murmer             Additional Data Reviewed: Recent Labs     08/24/14  0529  NA  144  BUN  10  CREATININE  0.66     Problem List:  Patient Active Problem List   Diagnosis Date Noted  . Palliative care encounter   . Respiratory acidosis   . Obesity hypoventilation syndrome   . Pleural effusion, bilateral   . Acute on chronic respiratory failure with hypoxia   . OSA (obstructive sleep apnea)   . Hypokalemia   . Diastolic dysfunction with chronic heart failure 08/14/2014  . Elevated troponin 08/14/2014  . HCAP (healthcare-associated pneumonia) 08/14/2014  . Altered mental status   . Aspiration into airway   . Elevated brain natriuretic peptide (BNP) level   . Chronic diastolic CHF (congestive heart failure)   . Pulmonary hypertension   . Polypharmacy   . Other specified hypothyroidism   . Acute respiratory failure 08/13/2014  . Acute and chronic respiratory failure (  acute-on-chronic) 08/13/2014  . Pressure ulcer 07/26/2014  . Hypercapnia 07/22/2014  . AKI (acute kidney injury) 07/22/2014  . Acute on chronic respiratory failure with hypercapnia   . COPD exacerbation   . UTI (lower urinary tract infection) 07/11/2014  . Hypernatremia 07/11/2014  . Hyperkalemia 07/11/2014  . Severe obesity (BMI >= 40) 01/27/2013  . Memory loss 12/30/2007  . Hypothyroidism 08/27/2006  . Dyslipidemia 08/27/2006  . Essential hypertension 08/27/2006     Palliative Care Assessment & Plan    Code Status:  DNR  Goals of Care:  After conversation with daughter, she views her mother has having limited rehab potential and wishes to talk to her brother about DC to SNF with hospice following with the goal to pass peacefully without  returning to the hospital. Per daughter, her father had a very similar disease trajectory secondary to CHF, COPD and he died in what was once the Palliative Care unit at [facility]. She shares that her mother has told her she is ready to go.   Desire for further Chaplaincy support:no  3. Symptom Management:  Dyspnea: Discussed the role of opioids as additional resource to manage dyspnea. Wishes to hold off until all comfort goals are finalized with her brother as well as additional conversations with her mother. As noted from initial consult, pt is afraid of these drugs for fear of addiction. Cont IV lasix, 02, CPAP as tolerated and targeted pulmonary treatments per Dr. Eliseo Squires  5. Prognosis: < 6 months. Pt likely does not meet in-patient hospice criteria but does meet hospice in home or facility criteria  5. Discharge Planning: Pt has plan to dc to [facility] the first of the week for rehab which maybe still what family will pursue. Daughter verbalized her wish to dc to SNF with hospice following in the SNF but wishes to talk to her brother first before deciding. She does know that if they do still elect rehab her ability to meet a skilled need maybe brief, and at that point she could elect her hospice benefit   Care plan was discussed with Dr. Eliseo Squires  Thank you for allowing the Palliative Medicine Team to assist in the care of this patient.   Time In: 1000 Time Out: 1040 Total Time 40 Prolonged Time Billed  no     Greater than 50%  of this time was spent counseling and coordinating care related to the above assessment and plan.   Dory Horn, NP  08/25/2014, 10:22 AM  Please contact Palliative Medicine Team phone at [phone] for questions and concerns.

## 2014-08-25 NOTE — Progress Notes (Signed)
Progress Note  Kirsten Flores DUK:[id number] DOB: 06/11/1923 DOA: 08/13/2014 PCP: Garnet Koyanagi, DO  Admit HPI / Brief Narrative: 81 yo F w/ Hx dyslipidemia, hypertension, hypothyroidism, morbid obesity, COPD on 4 L O2 at home, OSA/OHS on CPAP at night who presented with sudden onset of shortness of breath associated with cough.  The patient had recently been hospitalized for a COPD exacerbation with acute hypercarbia and discharged to a nursing home.  At the nursing home the patient was reportedly placed on 2L [place] O2 as well as a tapering dose of prednisone. Of note her son felt she had been using 4 L of oxygen at home.  Patient had been more drowsy and on routine evaluation she was found to be saturating 50% on her 2 L of nasal cannula, prompting her transfer to the ED. In the ED she rapidly improved after initiation of BIPAP.  Patient continue to struggle with her breathing and at this moment main concern is for acute on chronic diastolic heart failure. The flat of fluid overload signs on physical exam and crackles on auscultation of her lungs.   HPI/Subjective: Breathing less labored today Family and patient have been working with palliative care  Assessment/Plan: Acute on chronic hypoxic and hypercarbic respiratory failure  -multifactorial to include OSA/OHS, polypharmacy, COPD exacerbation, acute on chronic diastolic heart failure? sporadic aspiration pneumonitis, and incorrect SNF O2 regimen  -slowly improving -will cont maintenance tx for COPD and also continue diuresis, patient still with bibasilar crackles. No active wheezing at this moment. -continue O2 supplementation and CPAP at night -Will continue Pulmicort -Lasix 40 mg IV 3 doses with subsequent plan of discharge patient on Lasix 30 mg by mouth daily?  Possible aspiration -SLP eval has cleared her for a regular diet with thin liquids  -will monitor and make sure patient is in upright position when eating  Acute on chronic  diastolic heart failure and Bilateral pleural effusions right > left -s/p lasix IV now PO -CXR on 6/22 still demonstrating vascular congestion and bilateral pleural effusion -reassess volume status on 6/24  OSA/OHS -Continue CPAP at night for respiratory to maintain SPO2 89-93%  COPD exacerbation -Has completed a seven-day antibiotics course -slight exp wheezing on exam today; but better than in comparison with 6/21  -continue weaning steroids  -will continue O2 supplementation and pulmicort  Acute on Chronic diastolic congestive heart failure continue to have positive crackles and SOB -positive orthopnea, but less -will continue IV lasix -CXR on 6/22 still demonstrating vascular congestion -follow Is/Os -heart healthy diet -continue oxygen supplementation.  Mildly elevated troponin -Likely related to respiratory distress - peaked at 0.1 - no further workup indicated at this moment -patient not a candidate for any intervention and per discussion with patient just want to improve her breating to go home.  Pulmonary hypertension Continue lasix and low dose hydralazine BP is tolerating therapy well  Polypharmacy -Holding all sedating medications, to prevent decrease breathing drive -reduced dose of neurontin   Hypothyroidism. -Continue levothyroxine  -TSH 0.43  Hypernatremia -Present at admission and resolved with initial volume resuscitation -will monitor electrolytes  Hypokalemia -will replete  Code Status: DNR - NO CODE  Family Communication: son on phone 6/24 Disposition Plan: palliative care following  Consultants: none  Procedures: none  Antibiotics: Zosyn 6/13 > 6/20 Vancomycin 6/13 > 6/19  DVT prophylaxis: Lovenox   Objective: Blood pressure 126/34, pulse 75, temperature 98.5 F (36.9 C), temperature source Oral, resp. rate 20, height 5\' 5"  (1.651 m), weight 87.907  kg (193 lb 12.8 oz), SpO2 95 %.  Intake/Output Summary (Last 24 hours) at  08/25/14 1124 Last data filed at 08/25/14 1012  Gross per 24 hour  Intake    720 ml  Output      0 ml  Net    720 ml   Exam: General: NAD Lungs: Poor air movement bilateral bases, fine crackles and mild exp wheezes Cardiovascular: Regular rate and rhythm with 3/6 holosystolic murmur Abdomen: Nontender, nondistended, soft, bowel sounds positive, no rebound, no ascites, no appreciable mass Extremities: stasis dermatitis from venous insufficiency bilaterally. Positive right leg skin tear  Data Reviewed: Basic Metabolic Panel:  Recent Labs Lab 08/19/14 0256 08/20/14 0301 08/21/14 0600 08/22/14 0539 08/24/14 0529  NA 144 143 143 144 144  K 3.4* 3.5 3.7 3.4* 3.8  CL 97* 95* 95* 92* 96*  CO2 42* 45* 43* 44* 37*  GLUCOSE 95 76 83 83 86  BUN 9 6 7 9 10   CREATININE 0.55 0.63 0.49 0.58 0.66  CALCIUM 9.0 8.7* 8.8* 8.9 9.3  MG 1.8 1.6* 2.1  --   --    Liver Function Tests:  Recent Labs Lab 08/19/14 0256 08/20/14 0301  AST 28 26  ALT 23 23  ALKPHOS 37* 33*  BILITOT 0.8 0.7  PROT 4.4* 4.5*  ALBUMIN 2.2* 2.2*   CBC:  Recent Labs Lab 08/19/14 0256 08/20/14 0301  WBC 2.6* 2.9*  NEUTROABS 1.8 2.0  HGB 10.1* 9.5*  HCT 33.4* 30.3*  MCV 104.0* 103.1*  PLT 126* 125*   Cardiac Enzymes: No results for input(s): CKTOTAL, CKMB, CKMBINDEX, TROPONINI in the last 168 hours.  No results found for this or any previous visit (from the past 240 hour(s)).    Scheduled Meds:  Scheduled Meds: . antiseptic oral rinse  7 mL Mouth Rinse BID  . atorvastatin  10 mg Oral Daily  . betaxolol  1 drop Both Eyes BID  . budesonide (PULMICORT) nebulizer solution  0.25 mg Nebulization BID  . clopidogrel  75 mg Oral Q breakfast  . dextromethorphan-guaiFENesin  1 tablet Oral BID  . enoxaparin (LOVENOX) injection  40 mg Subcutaneous Q24H  . feeding supplement (ENSURE ENLIVE)  237 mL Oral BID BM  . gabapentin  300 mg Oral QHS  . hydrALAZINE  10 mg Oral 3 times per day  . levothyroxine  125  mcg Oral QAC breakfast  . pantoprazole  40 mg Oral Daily  . sertraline  50 mg Oral Daily    Time spent on care of this patient: 25 mins  Jennilyn Esteve [phone]  08/25/2014, 11:24 AM   LOS: 12 days

## 2014-08-26 DIAGNOSIS — E876 Hypokalemia: Secondary | ICD-10-CM

## 2014-08-26 DIAGNOSIS — I10 Essential (primary) hypertension: Secondary | ICD-10-CM

## 2014-08-26 MED ORDER — FUROSEMIDE 40 MG PO TABS: 40.0000 mg | ORAL_TABLET | Freq: Every day | ORAL | Status: DC

## 2014-08-26 MED ORDER — DM-GUAIFENESIN ER 30-600 MG PO TB12: 1.0000 | ORAL_TABLET | Freq: Two times a day (BID) | ORAL | Status: AC

## 2014-08-26 MED ORDER — CETYLPYRIDINIUM CHLORIDE 0.05 % MT LIQD: 7.0000 mL | Freq: Two times a day (BID) | OROMUCOSAL | Status: DC

## 2014-08-26 MED ORDER — IPRATROPIUM-ALBUTEROL 0.5-2.5 (3) MG/3ML IN SOLN: 3.0000 mL | RESPIRATORY_TRACT | Status: DC | PRN

## 2014-08-26 MED ORDER — BUDESONIDE 0.25 MG/2ML IN SUSP: 0.2500 mg | Freq: Two times a day (BID) | RESPIRATORY_TRACT | Status: DC

## 2014-08-26 NOTE — Progress Notes (Signed)
Left message with SW that patient is potentially read for SNF today.

## 2014-08-26 NOTE — Progress Notes (Signed)
08/26/14 patient being discharged to SNF this afternoon to [facility], IV site removed, and discharge papers reviewed.

## 2014-08-26 NOTE — Progress Notes (Signed)
LCSW made aware of patient being discharged to SNF. Patient will be going to [facility] for SNF. Son at the bedside, agreeable to plan. Called [facility] at [facility] and he is agreeable and prepared to accept patient.  EMS called for transport. NO other needs.  Lane Hacker, MSW Clinical Social Work: Emergency Room [phone]

## 2014-08-26 NOTE — Clinical Social Work Placement (Signed)
   CLINICAL SOCIAL WORK PLACEMENT  NOTE  Date:  08/26/2014  Patient Details  Name: Kirsten Flores MRN: [id number] Date of Birth: [birth year]  Clinical Social Work is seeking post-discharge placement for this patient at the [place] level of care (*CSW will initial, date and re-position this form in  chart as items are completed):  Yes   Patient/family provided with [facility] Work Department's list of facilities offering this level of care within the geographic area requested by the patient (or if unable, by the patient's family).  Yes   Patient/family informed of their freedom to choose among providers that offer the needed level of care, that participate in Medicare, Medicaid or managed care program needed by the patient, have an available bed and are willing to accept the patient.  Yes   Patient/family informed of [place]'s ownership interest in [facility] and [facility], as well as of the fact that they are under no obligation to receive care at these facilities.  PASRR submitted to EDS on       PASRR number received on       Existing PASRR number confirmed on 08/21/14     FL2 transmitted to all facilities in geographic area requested by pt/family on 08/21/14     FL2 transmitted to all facilities within larger geographic area on       Patient informed that his/her managed care company has contracts with or will negotiate with certain facilities, including the following:        Yes   Patient/family informed of bed offers received.  Patient chooses bed at [facility]     Physician recommends and patient chooses bed at [facility]    Patient to be transferred to [facility] on 08/26/14.  Patient to be transferred to facility by EMS/PTAR     Patient family notified on 08/26/14 of transfer.  Name of family member notified:  Patient Son: [name]     PHYSICIAN Please sign DNR, Please prepare priority discharge summary, including  medications, Please sign FL2     Additional Comment:    _______________________________________________ Lilly Cove, LCSW 08/26/2014, 1:06 PM

## 2014-08-26 NOTE — Discharge Summary (Signed)
Physician Discharge Summary  Kirsten Flores WLN:[id number] DOB: 03/16/1923 DOA: 08/13/2014  PCP: Garnet Koyanagi, DO  Admit date: 08/13/2014 Discharge date: 08/26/2014  Time spent: 35 minutes  Recommendations for Outpatient Follow-up:  1. BMP 1 week 2. Daily weights (194 on day of d/c) 3. 4L O2 4. palliative services to follow at SNF 5. CPAP At night 6. Aspiration precautions   Discharge Diagnoses:  Principal Problem:   Acute and chronic respiratory failure (acute-on-chronic) Active Problems:   Hypothyroidism   Dyslipidemia   Essential hypertension   Severe obesity (BMI >= 40)   COPD exacerbation   Diastolic dysfunction with chronic heart failure   Elevated troponin   HCAP (healthcare-associated pneumonia)   Altered mental status   Aspiration into airway   Elevated brain natriuretic peptide (BNP) level   Chronic diastolic CHF (congestive heart failure)   Pulmonary hypertension   Polypharmacy   Other specified hypothyroidism   Acute on chronic respiratory failure with hypoxia   OSA (obstructive sleep apnea)   Hypokalemia   Respiratory acidosis   Obesity hypoventilation syndrome   Pleural effusion, bilateral   Palliative care encounter   SOB (shortness of breath)   Discharge Condition: improved  Diet recommendation: heart healthy  Filed Weights   08/23/14 0613 08/24/14 0351 08/26/14 0519  Weight: 89.812 kg (198 lb) 87.907 kg (193 lb 12.8 oz) 88 kg (194 lb 0.1 oz)    History of present illness:  Kirsten Flores is a 81 y.o. female with Past medical history of dyslipidemia, hypertension, hypothyroidism, morbid obesity, COPD. The patient presents with complaints of sudden onset of shortness of breath which is progressively worsening associated with cough. The patient was recently hospitalized for possible COPD exacerbation with acute hypercarbia. The patient was discharged to nursing home. The nursing home the patient was placed on 2 L of nasal cannula as well as  tapering dose of prednisone and currently taking 10 mg right now. As per discussion based on documentation available, the son felt that the patient has been using 4 L of oxygen at home and they were in the process of arranging that at the nursing home. There was no fall no trauma reported. No fever no chills reported no nausea no vomiting. No diarrhea. Patient has been more drowsy and gradually has been worsening and later on on routine evaluation she was found to be saturating 50% on her 2 L of nasal cannula and therefore was brought here. Initially the patient was confused and lethargic and was not responding to questions but later on the time of my evaluation after being on 2 hours of BiPAP she feels significantly better and is able to provide history.  The patient is coming from home. And at her baseline independent for most of her ADL.  Hospital Course:  Acute on chronic hypoxic and hypercarbic respiratory failure  -multifactorial to include OSA/OHS, polypharmacy, COPD exacerbation, acute on chronic diastolic heart failure? sporadic aspiration pneumonitis, and incorrect SNF O2 regimen  -slowly improving -will cont maintenance tx for COPD and also continue diuresis, patient still with bibasilar crackles. No active wheezing at this moment. -continue O2 supplementation and CPAP at night -Will continue Pulmicort -Lasix Po -palliative care to follow at SNF  Possible aspiration -SLP eval has cleared her for a regular diet with thin liquids  -will monitor and make sure patient is in upright position when eating  Acute on chronic diastolic heart failure and Bilateral pleural effusions right > left -s/p lasix IV now PO -CXR on  6/22 still demonstrating vascular congestion and bilateral pleural effusion  OSA/OHS -Continue CPAP at night for respiratory to maintain SPO2 89-93%  COPD exacerbation -Has completed a seven-day antibiotics course -slight exp wheezing on exam today; but better than  in comparison with 6/21  -continue weaning steroids  -will continue O2 supplementation and pulmicort  Acute on Chronic diastolic congestive heart failure continue to have positive crackles and SOB -positive orthopnea, but less -will continue IV lasix -CXR on 6/22 still demonstrating vascular congestion -follow Is/Os -heart healthy diet -continue oxygen supplementation.  Mildly elevated troponin -Likely related to respiratory distress - peaked at 0.1 - no further workup indicated at this moment -patient not a candidate for any intervention and per discussion with patient just want to improve her breating to go home.  Pulmonary hypertension Continue lasix and low dose hydralazine BP is tolerating therapy well  Polypharmacy -Holding all sedating medications, to prevent decrease breathing drive -reduced dose of neurontin   Hypothyroidism. -Continue levothyroxine  -TSH 0.43  Hypernatremia -Present at admission and resolved with initial volume resuscitation -will monitor electrolytes  Hypokalemia -will replete  Procedures:    Consultations:  palliative care  Discharge Exam: Filed Vitals:   08/26/14 0519  BP:   Pulse: 85  Temp: 98 F (36.7 C)  Resp: 20    General:awake, NAD Cardiovascular:rrr Respiratory: diminished  Discharge Instructions   Discharge Instructions    Diet - low sodium heart healthy    Complete by:  As directed      Discharge instructions    Complete by:  As directed   Palliative services to follow at SNF BMp 1 week     Increase activity slowly    Complete by:  As directed           Current Discharge Medication List    START taking these medications   Details  antiseptic oral rinse (CPC / CETYLPYRIDINIUM CHLORIDE 0.05%) 0.05 % LIQD solution 7 mLs by Mouth Rinse route 2 (two) times daily. Refills: 0    budesonide (PULMICORT) 0.25 MG/2ML nebulizer solution Take 2 mLs (0.25 mg total) by nebulization 2 (two) times daily. Qty: 60  mL, Refills: 12    dextromethorphan-guaiFENesin (MUCINEX DM) 30-600 MG per 12 hr tablet Take 1 tablet by mouth 2 (two) times daily.    furosemide (LASIX) 40 MG tablet Take 1 tablet (40 mg total) by mouth daily. Qty: 30 tablet    ipratropium-albuterol (DUONEB) 0.5-2.5 (3) MG/3ML SOLN Take 3 mLs by nebulization every 4 (four) hours as needed. Qty: 360 mL      CONTINUE these medications which have NOT CHANGED   Details  acetaminophen (TYLENOL ARTHRITIS PAIN) 650 MG CR tablet Take 1,300 mg by mouth at bedtime as needed for pain.     atorvastatin (LIPITOR) 10 MG tablet Take 1 tablet (10 mg total) by mouth daily. Qty: 90 tablet, Refills: 1    betaxolol (BETOPTIC-S) 0.25 % ophthalmic suspension Place 1 drop into both eyes 2 (two) times daily.      Cholecalciferol (VITAMIN D PO) Take 1 tablet by mouth daily.    clopidogrel (PLAVIX) 75 MG tablet Take 1 tablet (75 mg total) by mouth daily with breakfast. Qty: 90 tablet, Refills: 3    feeding supplement, ENSURE ENLIVE, (ENSURE ENLIVE) LIQD Take 237 mLs by mouth 2 (two) times daily between meals. Qty: 237 mL, Refills: 0    fenofibrate (TRICOR) 145 MG tablet TAKE 1 BY MOUTH DAILY Qty: 90 tablet, Refills: 3    gabapentin (  NEURONTIN) 600 MG tablet Take 1 tablet (600 mg total) by mouth at bedtime. Qty: 90 tablet, Refills: 3   Associated Diagnoses: Pain    levothyroxine (SYNTHROID, LEVOTHROID) 125 MCG tablet Take 125 mcg by mouth daily before breakfast.    Multiple Vitamin (MULTIVITAMIN WITH MINERALS) TABS tablet Take 1 tablet by mouth daily.    pantoprazole (PROTONIX) 40 MG tablet Take 1 tablet (40 mg total) by mouth daily. Qty: 90 tablet, Refills: 3    sertraline (ZOLOFT) 50 MG tablet Take 1 tablet (50 mg total) by mouth daily. Qty: 90 tablet, Refills: 3   Associated Diagnoses: Depression      STOP taking these medications     levofloxacin (LEVAQUIN) 500 MG tablet      predniSONE (DELTASONE) 50 MG tablet      nystatin  (MYCOSTATIN) 100000 UNIT/ML suspension        Allergies  Allergen Reactions  . Diltiazem Hcl Other (See Comments)    Unknown reaction  . Neomycin Other (See Comments)    Unknown reaction (a long time ago)      The results of significant diagnostics from this hospitalization (including imaging, microbiology, ancillary and laboratory) are listed below for reference.    Significant Diagnostic Studies: Dg Chest 2 View  08/22/2014   CLINICAL DATA:  Increase shortness of breath, former smoking history  EXAM: CHEST  2 VIEW  COMPARISON:  Portable chest x-ray of 08/20/2014  FINDINGS: The lungs are poorly aerated with cardiomegaly, pulmonary vascular congestion, and effusions most consistent with congestive heart failure. No acute bony abnormality is seen.  IMPRESSION: No significant change in poor aeration with probable CHF and effusions.   Electronically Signed   By: Ivar Drape M.D.   On: 08/22/2014 08:08   Ct Angio Chest Pe W/cm &/or Wo Cm  08/17/2014   CLINICAL DATA:  81 year old female with sudden onset shortness of breath and progressive increasing cough. Current history of COPD. Initial encounter.  EXAM: CT ANGIOGRAPHY CHEST WITH CONTRAST  TECHNIQUE: Multidetector CT imaging of the chest was performed using the standard protocol during bolus administration of intravenous contrast. Multiplanar CT image reconstructions and MIPs were obtained to evaluate the vascular anatomy.  CONTRAST:  29mL OMNIPAQUE IOHEXOL 350 MG/ML SOLN  COMPARISON:  Chest CTA 07/20/2013  FINDINGS: Good contrast bolus timing in the pulmonary arterial tree. Mild to moderate respiratory motion artifact. The central pulmonary arteries are patent. The hilar pulmonary arteries are patent. No convincing filling defect the visualized distal pulmonary arteries.  Cardiomegaly appears progressed since 2015. No pericardial effusion. Chronic calcified aortic atherosclerosis in the chest and visualized abdomen. No mediastinal  lymphadenopathy.  Layering moderate sized bilateral pleural effusions. Bilateral compressive atelectasis. No pulmonary consolidation. Mild mosaic attenuation in the lungs greater on the right. Major airways are patent, with atelectatic changes to the airways are row on the hila.  Severe T12 compression fracture with mild retropulsion of bone has been visible since 07/11/2014. Chronic posterior right tenth rib fracture. Osteopenia. No acute osseous abnormality identified.  Review of the MIP images confirms the above findings.  IMPRESSION: 1. Degraded by respiratory motion artifact but no acute pulmonary embolus identified. 2. Moderate layering bilateral pleural effusions with compressive atelectasis. 3. Cardiomegaly appears progressed since May 2015. 4. Chronic advanced calcified atherosclerosis of the aorta. 5. Severe T12 compression fracture is subacute to chronic.   Electronically Signed   By: Genevie Ann M.D.   On: 08/17/2014 23:39   Dg Chest Port 1 View  08/20/2014  CLINICAL DATA:  Pleural effusion.  EXAM: PORTABLE CHEST - 1 VIEW  COMPARISON:  CT 08/17/2014.  Chest x-ray 08/13/2014, 07/22/2014  FINDINGS: Mediastinum hilar structures are stable. Cardiomegaly with pulmonary vascular congestion with bilateral infiltrates consistent with pulmonary edema and pleural effusions. These findings are consistent congestive heart failure . No pneumothorax. No acute osseus abnormality.  IMPRESSION: Findings consistent with congestive heart failure with bilateral pulmonary edema and pleural effusions. Basilar pneumonia cannot be excluded.   Electronically Signed   By: Marcello Moores  Register   On: 08/20/2014 07:16   Dg Chest Port 1 View  08/13/2014   CLINICAL DATA:  Respiratory failure, on BiPAP  EXAM: PORTABLE CHEST - 1 VIEW  COMPARISON:  07/22/2014  FINDINGS: Patient is rotated.  Chronic interstitial markings.  Patchy bilateral lower lobe opacities, likely atelectasis. Suspected small right pleural effusion. No frank  interstitial edema.  Cardiomegaly.  IMPRESSION: Patchy bilateral lower lobe opacities, likely atelectasis. Suspected small right pleural effusion.   Electronically Signed   By: Julian Hy M.D.   On: 08/13/2014 19:20    Microbiology: No results found for this or any previous visit (from the past 240 hour(s)).   Labs: Basic Metabolic Panel:  Recent Labs Lab 08/20/14 0301 08/21/14 0600 08/22/14 0539 08/24/14 0529  NA 143 143 144 144  K 3.5 3.7 3.4* 3.8  CL 95* 95* 92* 96*  CO2 45* 43* 44* 37*  GLUCOSE 76 83 83 86  BUN 6 7 9 10   CREATININE 0.63 0.49 0.58 0.66  CALCIUM 8.7* 8.8* 8.9 9.3  MG 1.6* 2.1  --   --    Liver Function Tests:  Recent Labs Lab 08/20/14 0301  AST 26  ALT 23  ALKPHOS 33*  BILITOT 0.7  PROT 4.5*  ALBUMIN 2.2*   No results for input(s): LIPASE, AMYLASE in the last 168 hours. No results for input(s): AMMONIA in the last 168 hours. CBC:  Recent Labs Lab 08/20/14 0301  WBC 2.9*  NEUTROABS 2.0  HGB 9.5*  HCT 30.3*  MCV 103.1*  PLT 125*   Cardiac Enzymes: No results for input(s): CKTOTAL, CKMB, CKMBINDEX, TROPONINI in the last 168 hours. BNP: BNP (last 3 results)  Recent Labs  07/11/14 1525 07/22/14 1810 08/13/14 2007  BNP 282.5* 715.8* 454.8*    ProBNP (last 3 results) No results for input(s): PROBNP in the last 8760 hours.  CBG: No results for input(s): GLUCAP in the last 168 hours.     SignedEulogio Bear  [facility] 08/26/2014, 12:18 PM

## 2014-08-26 NOTE — Plan of Care (Signed)
Problem: Phase II Progression Outcomes Goal: Dyspnea controlled w/progressive activity Outcome: Progressing O2 via [place] and BiPAP ordered

## 2014-08-26 NOTE — Progress Notes (Signed)
LCSW aware of patient and potential for DC today. Patient has been accepted and able to go to [facility] today.  Awaiting DC summary. Once all paperwork is completed, will update family. RN staff aware of plan.  Lane Hacker, MSW Clinical Social Work: Emergency Room [phone]

## 2014-08-27 ENCOUNTER — Encounter: Payer: Self-pay | Admitting: Adult Health

## 2014-08-27 ENCOUNTER — Non-Acute Institutional Stay (SKILLED_NURSING_FACILITY): Payer: Medicare Other | Admitting: Adult Health

## 2014-08-27 DIAGNOSIS — J441 Chronic obstructive pulmonary disease with (acute) exacerbation: Secondary | ICD-10-CM | POA: Diagnosis not present

## 2014-08-27 DIAGNOSIS — F329 Major depressive disorder, single episode, unspecified: Secondary | ICD-10-CM | POA: Diagnosis not present

## 2014-08-27 DIAGNOSIS — G629 Polyneuropathy, unspecified: Secondary | ICD-10-CM

## 2014-08-27 DIAGNOSIS — E43 Unspecified severe protein-calorie malnutrition: Secondary | ICD-10-CM

## 2014-08-27 DIAGNOSIS — E039 Hypothyroidism, unspecified: Secondary | ICD-10-CM | POA: Diagnosis not present

## 2014-08-27 DIAGNOSIS — K219 Gastro-esophageal reflux disease without esophagitis: Secondary | ICD-10-CM

## 2014-08-27 DIAGNOSIS — I5032 Chronic diastolic (congestive) heart failure: Secondary | ICD-10-CM | POA: Diagnosis not present

## 2014-08-27 DIAGNOSIS — D638 Anemia in other chronic diseases classified elsewhere: Secondary | ICD-10-CM | POA: Diagnosis not present

## 2014-08-27 DIAGNOSIS — I10 Essential (primary) hypertension: Secondary | ICD-10-CM | POA: Diagnosis not present

## 2014-08-27 DIAGNOSIS — G4733 Obstructive sleep apnea (adult) (pediatric): Secondary | ICD-10-CM | POA: Diagnosis not present

## 2014-08-27 DIAGNOSIS — F32A Depression, unspecified: Secondary | ICD-10-CM

## 2014-08-27 DIAGNOSIS — R5381 Other malaise: Secondary | ICD-10-CM | POA: Diagnosis not present

## 2014-08-27 DIAGNOSIS — E785 Hyperlipidemia, unspecified: Secondary | ICD-10-CM

## 2014-08-27 NOTE — Progress Notes (Signed)
Patient ID: Kirsten Flores, female   DOB: May 09, 1923, 81 y.o.   MRN: [id number]   08/27/2014  Facility:  Nursing Home Location:  [place] Room Number: 705-P LEVEL OF CARE:  SNF (31)   Chief Complaint  Patient presents with  . Hospitalization Follow-up    Physical deconditioning, COPD, OSA, CHF, hypertension, GERD, hypothyroidism, hyperlipidemia, neuropathy, anemia, depression and protein calorie malnutrition    HISTORY OF PRESENT ILLNESS:  This is an 81 year old female who has been admitted to [facility] on 08/26/[facility] from [facility]. She has PMH of hyperlipidemia, hypertension, hypothyroidism, morbid obesity and COPD. She presented to the ER with sudden progressive SOB and cough. She was recently hospitalized due to COPD exacerbation with acute hypercarbia. She was discharged to a nursing home with tapering prednisone and O2 at 2 L/min. Son reports that O2 at home is 4 L/min. Acute on chronic hypoxic and hypercarbic respiratory failure was thought to be multi-factorial to include OSA, polypharmacy, COPD exacerbation, acute on chronic diastolic heart failure, sporadic respiratory pneumonitis and incorrect SNF O2 regimen.  She has been admitted for a short-term rehabilitation.  PAST MEDICAL HISTORY:  Past Medical History  Diagnosis Date  . Hyperlipidemia   . Hypertension   . Osteopenia   . Thyroid disease   . B12 deficiency   . Arthritis   . Memory loss   . Knee pain     RIGHT  . Gait disturbance     CURRENT MEDICATIONS: Reviewed per MAR/see medication list  Allergies  Allergen Reactions  . Diltiazem Hcl Other (See Comments)    Unknown reaction  . Neomycin Other (See Comments)    Unknown reaction (a long time ago)     REVIEW OF SYSTEMS:  GENERAL: no change in appetite, no fatigue, no weight changes, no fever, chills or weakness RESPIRATORY: no cough, SOB, DOE, wheezing, hemoptysis CARDIAC: no chest pain, or palpitations GI: no  abdominal pain, diarrhea, constipation, heart burn, nausea or vomiting  PHYSICAL EXAMINATION  GENERAL: no acute distress,  Obese SKIN:  Dry yellowish lesion on forehead, bruise on right shin, dry BLE EYES: conjunctivae normal, sclerae normal, normal eye lids NECK: supple, trachea midline, no neck masses, no thyroid tenderness, no thyromegaly LYMPHATICS: no LAN in the neck, no supraclavicular LAN RESPIRATORY: breathing is even & unlabored, BS CTAB CARDIAC: RRR, no murmur,no extra heart sounds, BLE edema 1+ GI: abdomen soft, normal BS, no masses, no tenderness, no hepatomegaly, no splenomegaly EXTREMITIES:  Able to move 4 extremities PSYCHIATRIC: the patient is alert & oriented to person, affect & behavior appropriate  LABS/RADIOLOGY: Labs reviewed: Basic Metabolic Panel:  Recent Labs  08/19/14 0256 08/20/14 0301 08/21/14 0600 08/22/14 0539 08/24/14 0529  NA 144 143 143 144 144  K 3.4* 3.5 3.7 3.4* 3.8  CL 97* 95* 95* 92* 96*  CO2 42* 45* 43* 44* 37*  GLUCOSE 95 76 83 83 86  BUN 9 6 7 9 10   CREATININE 0.55 0.63 0.49 0.58 0.66  CALCIUM 9.0 8.7* 8.8* 8.9 9.3  MG 1.8 1.6* 2.1  --   --    Liver Function Tests:  Recent Labs  08/18/14 0557 08/19/14 0256 08/20/14 0301  AST 27 28 26   ALT 22 23 23   ALKPHOS 36* 37* 33*  BILITOT 0.8 0.8 0.7  PROT 4.6* 4.4* 4.5*  ALBUMIN 2.2* 2.2* 2.2*   CBC:  Recent Labs  08/18/14 0557 08/19/14 0256 08/20/14 0301  WBC 2.7* 2.6* 2.9*  NEUTROABS  1.7 1.8 2.0  HGB 9.8* 10.1* 9.5*  HCT 32.0* 33.4* 30.3*  MCV 103.6* 104.0* 103.1*  PLT 127* 126* 125*   Lipid Panel:  Recent Labs  10/30/13 1552 05/01/14 1657  HDL 27.30* 38.10*   Cardiac Enzymes:  Recent Labs  08/17/14 1923 08/18/14 0029 08/18/14 0557  TROPONINI 0.05* 0.06* 0.06*   CBG:  Recent Labs  07/13/14 0806 07/14/14 0733 07/24/14 1123  GLUCAP 101* 109* 144*    Dg Chest 2 View  08/22/2014   CLINICAL DATA:  Increase shortness of breath, former smoking history   EXAM: CHEST  2 VIEW  COMPARISON:  Portable chest x-ray of 08/20/2014  FINDINGS: The lungs are poorly aerated with cardiomegaly, pulmonary vascular congestion, and effusions most consistent with congestive heart failure. No acute bony abnormality is seen.  IMPRESSION: No significant change in poor aeration with probable CHF and effusions.   Electronically Signed   By: Ivar Drape M.D.   On: 08/22/2014 08:08   Ct Angio Chest Pe W/cm &/or Wo Cm  08/17/2014   CLINICAL DATA:  81 year old female with sudden onset shortness of breath and progressive increasing cough. Current history of COPD. Initial encounter.  EXAM: CT ANGIOGRAPHY CHEST WITH CONTRAST  TECHNIQUE: Multidetector CT imaging of the chest was performed using the standard protocol during bolus administration of intravenous contrast. Multiplanar CT image reconstructions and MIPs were obtained to evaluate the vascular anatomy.  CONTRAST:  78mL OMNIPAQUE IOHEXOL 350 MG/ML SOLN  COMPARISON:  Chest CTA 07/20/2013  FINDINGS: Good contrast bolus timing in the pulmonary arterial tree. Mild to moderate respiratory motion artifact. The central pulmonary arteries are patent. The hilar pulmonary arteries are patent. No convincing filling defect the visualized distal pulmonary arteries.  Cardiomegaly appears progressed since 2015. No pericardial effusion. Chronic calcified aortic atherosclerosis in the chest and visualized abdomen. No mediastinal lymphadenopathy.  Layering moderate sized bilateral pleural effusions. Bilateral compressive atelectasis. No pulmonary consolidation. Mild mosaic attenuation in the lungs greater on the right. Major airways are patent, with atelectatic changes to the airways are row on the hila.  Severe T12 compression fracture with mild retropulsion of bone has been visible since 07/11/2014. Chronic posterior right tenth rib fracture. Osteopenia. No acute osseous abnormality identified.  Review of the MIP images confirms the above findings.   IMPRESSION: 1. Degraded by respiratory motion artifact but no acute pulmonary embolus identified. 2. Moderate layering bilateral pleural effusions with compressive atelectasis. 3. Cardiomegaly appears progressed since May 2015. 4. Chronic advanced calcified atherosclerosis of the aorta. 5. Severe T12 compression fracture is subacute to chronic.   Electronically Signed   By: Genevie Ann M.D.   On: 08/17/2014 23:39   Dg Chest Port 1 View  08/20/2014   CLINICAL DATA:  Pleural effusion.  EXAM: PORTABLE CHEST - 1 VIEW  COMPARISON:  CT 08/17/2014.  Chest x-ray 08/13/2014, 07/22/2014  FINDINGS: Mediastinum hilar structures are stable. Cardiomegaly with pulmonary vascular congestion with bilateral infiltrates consistent with pulmonary edema and pleural effusions. These findings are consistent congestive heart failure . No pneumothorax. No acute osseus abnormality.  IMPRESSION: Findings consistent with congestive heart failure with bilateral pulmonary edema and pleural effusions. Basilar pneumonia cannot be excluded.   Electronically Signed   By: Marcello Moores  Register   On: 08/20/2014 07:16   Dg Chest Port 1 View  08/13/2014   CLINICAL DATA:  Respiratory failure, on BiPAP  EXAM: PORTABLE CHEST - 1 VIEW  COMPARISON:  07/22/2014  FINDINGS: Patient is rotated.  Chronic interstitial markings.  Patchy  bilateral lower lobe opacities, likely atelectasis. Suspected small right pleural effusion. No frank interstitial edema.  Cardiomegaly.  IMPRESSION: Patchy bilateral lower lobe opacities, likely atelectasis. Suspected small right pleural effusion.   Electronically Signed   By: Julian Hy M.D.   On: 08/13/2014 19:20    ASSESSMENT/PLAN:  Physical deconditioning - for rehabilitation  COPD exacerbation - continue Mucinex 600 mg 1 tab by mouth twice a day, DuoNeb 2.5 mg/3 mL 1 neck every 4 hours when necessary and Pulmicort 0.25 mg/2 mL 19 twice a day; completed 7 days and to be optic course; continue O2 at 4 L/minute;  palliative consult with [facility]   OSA - continue CPAP at night  Chronic diastolic heart failure - continue Lasix 40 mg by mouth daily and daily weights  GERD - continue Protonix 40 mg 1 tab by mouth daily  Hypothyroidism - TSH 0.43; continue levothyroxine 125 g by mouth daily  Hyperlipidemia - continue Lipitor 10 mg 1 tab by mouth daily and TriCor 145 mg 1 tab by mouth daily  Neuropathy - continue Neurontin 600 mg 1 tab by mouth daily at bedtime  Anemia of chronic disease - hemoglobin 9.5; will monitor  Depression - mood is stable; continue Zoloft 50 mg 1 tab by mouth daily  Protein calorie malnutrition, severe - albumin 2.2; referred to our D; continue supplementation   Goals of care:  Short-term rehabilitation   Labs/test ordered:  BMP in 1 week  Spent 50 minutes in patient care.    [facility], NP [employer] [phone]

## 2014-08-28 ENCOUNTER — Non-Acute Institutional Stay (SKILLED_NURSING_FACILITY): Payer: Medicare Other | Admitting: Internal Medicine

## 2014-08-28 DIAGNOSIS — K219 Gastro-esophageal reflux disease without esophagitis: Secondary | ICD-10-CM

## 2014-08-28 DIAGNOSIS — J441 Chronic obstructive pulmonary disease with (acute) exacerbation: Secondary | ICD-10-CM | POA: Diagnosis not present

## 2014-08-28 DIAGNOSIS — R5381 Other malaise: Secondary | ICD-10-CM

## 2014-08-28 DIAGNOSIS — D638 Anemia in other chronic diseases classified elsewhere: Secondary | ICD-10-CM | POA: Diagnosis not present

## 2014-08-28 DIAGNOSIS — I5033 Acute on chronic diastolic (congestive) heart failure: Secondary | ICD-10-CM

## 2014-08-28 DIAGNOSIS — G629 Polyneuropathy, unspecified: Secondary | ICD-10-CM

## 2014-08-28 DIAGNOSIS — E039 Hypothyroidism, unspecified: Secondary | ICD-10-CM | POA: Diagnosis not present

## 2014-08-28 DIAGNOSIS — J9621 Acute and chronic respiratory failure with hypoxia: Secondary | ICD-10-CM

## 2014-08-28 DIAGNOSIS — E46 Unspecified protein-calorie malnutrition: Secondary | ICD-10-CM | POA: Diagnosis not present

## 2014-08-28 DIAGNOSIS — F322 Major depressive disorder, single episode, severe without psychotic features: Secondary | ICD-10-CM | POA: Diagnosis not present

## 2014-08-28 DIAGNOSIS — F329 Major depressive disorder, single episode, unspecified: Secondary | ICD-10-CM

## 2014-08-28 NOTE — Progress Notes (Signed)
Patient ID: Kirsten Flores, female   DOB: 12-17-1923, 81 y.o.   MRN: [id number]     [facility]   PCP: Garnet Koyanagi, DO   Allergies  Allergen Reactions  . Diltiazem Hcl Other (See Comments)    Unknown reaction  . Neomycin Other (See Comments)    Unknown reaction (a long time ago)    Chief Complaint  Patient presents with  . New Admit To SNF     HPI:  81 year old patient is here for short term rehabilitation post hospital admission from 08/13/14-08/26/14 with acute on chronic respiratory failure from copd and chf exacerbation. There was concern for aspiration pneumonia. She responded well to steroid and diuresis. She is seen in her room today. She denies any concerns. She has PMH of copd, chf, HTN, OSA, hypothyroidism among others  Review of Systems:  Constitutional: Negative for fever, chills, diaphoresis.  HENT: Negative for headache, congestion, nasal discharge Eyes: Negative for eye pain, blurred vision, double vision and discharge.  Respiratory: Negative for cough, shortness of breath and wheezing.   Cardiovascular: Negative for chest pain, palpitations, leg swelling.  Gastrointestinal: Negative for heartburn, nausea, vomiting, abdominal pain Genitourinary: Negative for dysuria Musculoskeletal: Negative for back pain, falls Skin: Negative for itching, rash.  Neurological: Negative for dizziness, tingling, focal weakness Psychiatric/Behavioral: Negative for depression   Past Medical History  Diagnosis Date  . Hyperlipidemia   . Hypertension   . Osteopenia   . Thyroid disease   . B12 deficiency   . Arthritis   . Memory loss   . Knee pain     RIGHT  . Gait disturbance    Past Surgical History  Procedure Laterality Date  . Appendectomy    . Cholecystectomy    . Abdominal hysterectomy    . Oophorectomy     Social History:   reports that she quit smoking about 43 years ago. Her smoking use included Cigarettes. She has a 50  pack-year smoking history. She has never used smokeless tobacco. She reports that she drinks alcohol. She reports that she does not use illicit drugs.  Family History  Problem Relation Age of Onset  . Colon cancer    . Melanoma    . Cancer Other     COLON...1ST DEGREE RELATIVE    Medications: Patient's Medications  New Prescriptions   No medications on file  Previous Medications   ACETAMINOPHEN (TYLENOL ARTHRITIS PAIN) 650 MG CR TABLET    Take 1,300 mg by mouth at bedtime as needed for pain.    ANTISEPTIC ORAL RINSE (CPC / CETYLPYRIDINIUM CHLORIDE 0.05%) 0.05 % LIQD SOLUTION    7 mLs by Mouth Rinse route 2 (two) times daily.   ATORVASTATIN (LIPITOR) 10 MG TABLET    Take 1 tablet (10 mg total) by mouth daily.   BETAXOLOL (BETOPTIC-S) 0.25 % OPHTHALMIC SUSPENSION    Place 1 drop into both eyes 2 (two) times daily.     BUDESONIDE (PULMICORT) 0.25 MG/2ML NEBULIZER SOLUTION    Take 2 mLs (0.25 mg total) by nebulization 2 (two) times daily.   CHOLECALCIFEROL (VITAMIN D PO)    Take 1 tablet by mouth daily.   CLOPIDOGREL (PLAVIX) 75 MG TABLET    Take 1 tablet (75 mg total) by mouth daily with breakfast.   DEXTROMETHORPHAN-GUAIFENESIN (MUCINEX DM) 30-600 MG PER 12 HR TABLET    Take 1 tablet by mouth 2 (two) times daily.   FEEDING SUPPLEMENT, ENSURE ENLIVE, (ENSURE ENLIVE) LIQD  Take 237 mLs by mouth 2 (two) times daily between meals.   FENOFIBRATE (TRICOR) 145 MG TABLET    TAKE 1 BY MOUTH DAILY   FUROSEMIDE (LASIX) 40 MG TABLET    Take 1 tablet (40 mg total) by mouth daily.   GABAPENTIN (NEURONTIN) 600 MG TABLET    Take 1 tablet (600 mg total) by mouth at bedtime.   IPRATROPIUM-ALBUTEROL (DUONEB) 0.5-2.5 (3) MG/3ML SOLN    Take 3 mLs by nebulization every 4 (four) hours as needed.   LEVOTHYROXINE (SYNTHROID, LEVOTHROID) 125 MCG TABLET    Take 125 mcg by mouth daily before breakfast.   MULTIPLE VITAMIN (MULTIVITAMIN WITH MINERALS) TABS TABLET    Take 1 tablet by mouth daily.   PANTOPRAZOLE  (PROTONIX) 40 MG TABLET    Take 1 tablet (40 mg total) by mouth daily.   SERTRALINE (ZOLOFT) 50 MG TABLET    Take 1 tablet (50 mg total) by mouth daily.  Modified Medications   No medications on file  Discontinued Medications   No medications on file     Physical Exam: Filed Vitals:   08/28/14 1406  BP: 143/60  Pulse: 89  Temp: 98.4 F (36.9 C)  Resp: 18  SpO2: 96%    General- elderly female, obese, in no acute distress Head- normocephalic, atraumatic Throat- moist mucus membrane, dentures present Eyes- PERRLA, EOMI, no pallor, no icterus, no discharge, normal conjunctiva, normal sclera Neck- no cervical lymphadenopathy, no jugular vein distension Cardiovascular- normal s1,s2, no murmurs, palpable dorsalis pedis and radial pulses, trace leg edema Respiratory- bilateral poor air entry, no wheeze, no rhonchi, no crackles, no use of accessory muscles Abdomen- bowel sounds present, soft, non tender Musculoskeletal- able to move all 4 extremities, generalized weakness Neurological- no focal deficit Skin- warm and dry, right leg wound with dry dressing, venous stasis changes in both legs Psychiatry- alert and oriented to person, place and time, normal mood and affect    Labs reviewed: Basic Metabolic Panel:  Recent Labs  08/19/14 0256 08/20/14 0301 08/21/14 0600 08/22/14 0539 08/24/14 0529  NA 144 143 143 144 144  K 3.4* 3.5 3.7 3.4* 3.8  CL 97* 95* 95* 92* 96*  CO2 42* 45* 43* 44* 37*  GLUCOSE 95 76 83 83 86  BUN 9 6 7 9 10   CREATININE 0.55 0.63 0.49 0.58 0.66  CALCIUM 9.0 8.7* 8.8* 8.9 9.3  MG 1.8 1.6* 2.1  --   --    Liver Function Tests:  Recent Labs  08/18/14 0557 08/19/14 0256 08/20/14 0301  AST 27 28 26   ALT 22 23 23   ALKPHOS 36* 37* 33*  BILITOT 0.8 0.8 0.7  PROT 4.6* 4.4* 4.5*  ALBUMIN 2.2* 2.2* 2.2*   No results for input(s): LIPASE, AMYLASE in the last 8760 hours. No results for input(s): AMMONIA in the last 8760 hours. CBC:  Recent Labs   08/18/14 0557 08/19/14 0256 08/20/14 0301  WBC 2.7* 2.6* 2.9*  NEUTROABS 1.7 1.8 2.0  HGB 9.8* 10.1* 9.5*  HCT 32.0* 33.4* 30.3*  MCV 103.6* 104.0* 103.1*  PLT 127* 126* 125*   Cardiac Enzymes:  Recent Labs  08/17/14 1923 08/18/14 0029 08/18/14 0557  TROPONINI 0.05* 0.06* 0.06*   BNP: Invalid input(s): POCBNP CBG:  Recent Labs  07/13/14 0806 07/14/14 0733 07/24/14 1123  GLUCAP 101* 109* 144*    Radiological Exams:   Dg Chest 2 View  08/22/2014   CLINICAL DATA:  Increase shortness of breath, former smoking history  EXAM: CHEST  2 VIEW  COMPARISON:  Portable chest x-ray of 08/20/2014  FINDINGS: The lungs are poorly aerated with cardiomegaly, pulmonary vascular congestion, and effusions most consistent with congestive heart failure. No acute bony abnormality is seen.  IMPRESSION: No significant change in poor aeration with probable CHF and effusions.   Electronically Signed   By: Ivar Drape M.D.   On: 08/22/2014 08:08   Ct Angio Chest Pe W/cm &/or Wo Cm  08/17/2014   CLINICAL DATA:  81 year old female with sudden onset shortness of breath and progressive increasing cough. Current history of COPD. Initial encounter.  EXAM: CT ANGIOGRAPHY CHEST WITH CONTRAST  TECHNIQUE: Multidetector CT imaging of the chest was performed using the standard protocol during bolus administration of intravenous contrast. Multiplanar CT image reconstructions and MIPs were obtained to evaluate the vascular anatomy.  CONTRAST:  27mL OMNIPAQUE IOHEXOL 350 MG/ML SOLN  COMPARISON:  Chest CTA 07/20/2013  FINDINGS: Good contrast bolus timing in the pulmonary arterial tree. Mild to moderate respiratory motion artifact. The central pulmonary arteries are patent. The hilar pulmonary arteries are patent. No convincing filling defect the visualized distal pulmonary arteries.  Cardiomegaly appears progressed since 2015. No pericardial effusion. Chronic calcified aortic atherosclerosis in the chest and visualized  abdomen. No mediastinal lymphadenopathy.  Layering moderate sized bilateral pleural effusions. Bilateral compressive atelectasis. No pulmonary consolidation. Mild mosaic attenuation in the lungs greater on the right. Major airways are patent, with atelectatic changes to the airways are row on the hila.  Severe T12 compression fracture with mild retropulsion of bone has been visible since 07/11/2014. Chronic posterior right tenth rib fracture. Osteopenia. No acute osseous abnormality identified.  Review of the MIP images confirms the above findings.  IMPRESSION: 1. Degraded by respiratory motion artifact but no acute pulmonary embolus identified. 2. Moderate layering bilateral pleural effusions with compressive atelectasis. 3. Cardiomegaly appears progressed since May 2015. 4. Chronic advanced calcified atherosclerosis of the aorta. 5. Severe T12 compression fracture is subacute to chronic.   Electronically Signed   By: Genevie Ann M.D.   On: 08/17/2014 23:39   Dg Chest Port 1 View  08/20/2014   CLINICAL DATA:  Pleural effusion.  EXAM: PORTABLE CHEST - 1 VIEW  COMPARISON:  CT 08/17/2014.  Chest x-ray 08/13/2014, 07/22/2014  FINDINGS: Mediastinum hilar structures are stable. Cardiomegaly with pulmonary vascular congestion with bilateral infiltrates consistent with pulmonary edema and pleural effusions. These findings are consistent congestive heart failure . No pneumothorax. No acute osseus abnormality.  IMPRESSION: Findings consistent with congestive heart failure with bilateral pulmonary edema and pleural effusions. Basilar pneumonia cannot be excluded.   Electronically Signed   By: Marcello Moores  Register   On: 08/20/2014 07:16   Dg Chest Port 1 View  08/13/2014   CLINICAL DATA:  Respiratory failure, on BiPAP  EXAM: PORTABLE CHEST - 1 VIEW  COMPARISON:  07/22/2014  FINDINGS: Patient is rotated.  Chronic interstitial markings.  Patchy bilateral lower lobe opacities, likely atelectasis. Suspected small right pleural  effusion. No frank interstitial edema.  Cardiomegaly.  IMPRESSION: Patchy bilateral lower lobe opacities, likely atelectasis. Suspected small right pleural effusion.   Electronically Signed   By: Julian Hy M.D.   On: 08/13/2014 19:20    Assessment/Plan  Physical deconditioning Will have patient work with PT/OT as tolerated to regain strength and restore function.  Fall precautions are in place.  Acute on chronic respiratory failure From copd and chf exacerbation. Currently on o2, breathing stable. Continue duoneb and pulmicort with oxygen, continue lasix, monitor clinically, encouraged  to use incentive spirometer. Will have palliative care team evaluate pt with progressive illness. Continue using CPAP at night  COPD exacerbation Stable. Continue pulmicort bid, mucinex bid and prn duoneb with o2. Completed antibiotics and steroid.   Diastolic chf Stable, monitor weight, continue lasix 40 mg daily, monitor bmp  Protein calorie malnutrition Followed by dietary team, encourage po intake with aspiration precautions. Continue medpass supplement  Anemia of chronic disease Monitor h&h, last hb 9.5  GERD continue Protonix 40 mg daily  Neuropathy Stable on neurontin 600 mg daily  Hypothyroidism continue levothyroxine 125 mcg daily  Depression continue Zoloft 50 mg daily   Goals of care: short term rehabilitation   Family/ staff Communication: reviewed care plan with patient and nursing supervisor    Blanchie Serve, MD  [place] [phone] (Monday-Friday 8 am - 5 pm) [phone] (afterhours)

## 2014-09-14 ENCOUNTER — Encounter: Payer: Self-pay | Admitting: Adult Health

## 2014-09-14 ENCOUNTER — Non-Acute Institutional Stay (SKILLED_NURSING_FACILITY): Payer: Medicare Other | Admitting: Adult Health

## 2014-09-14 DIAGNOSIS — J441 Chronic obstructive pulmonary disease with (acute) exacerbation: Secondary | ICD-10-CM | POA: Diagnosis not present

## 2014-09-14 DIAGNOSIS — E46 Unspecified protein-calorie malnutrition: Secondary | ICD-10-CM

## 2014-09-14 DIAGNOSIS — G4733 Obstructive sleep apnea (adult) (pediatric): Secondary | ICD-10-CM

## 2014-09-14 DIAGNOSIS — K219 Gastro-esophageal reflux disease without esophagitis: Secondary | ICD-10-CM

## 2014-09-14 DIAGNOSIS — I10 Essential (primary) hypertension: Secondary | ICD-10-CM

## 2014-09-14 DIAGNOSIS — F329 Major depressive disorder, single episode, unspecified: Secondary | ICD-10-CM

## 2014-09-14 DIAGNOSIS — E785 Hyperlipidemia, unspecified: Secondary | ICD-10-CM

## 2014-09-14 DIAGNOSIS — F322 Major depressive disorder, single episode, severe without psychotic features: Secondary | ICD-10-CM

## 2014-09-14 DIAGNOSIS — R5381 Other malaise: Secondary | ICD-10-CM

## 2014-09-14 DIAGNOSIS — D638 Anemia in other chronic diseases classified elsewhere: Secondary | ICD-10-CM

## 2014-09-14 DIAGNOSIS — I5032 Chronic diastolic (congestive) heart failure: Secondary | ICD-10-CM | POA: Diagnosis not present

## 2014-09-14 DIAGNOSIS — G629 Polyneuropathy, unspecified: Secondary | ICD-10-CM | POA: Diagnosis not present

## 2014-09-14 DIAGNOSIS — E039 Hypothyroidism, unspecified: Secondary | ICD-10-CM

## 2014-09-14 DIAGNOSIS — E876 Hypokalemia: Secondary | ICD-10-CM

## 2014-09-21 ENCOUNTER — Telehealth: Payer: Self-pay | Admitting: Family Medicine

## 2014-09-21 ENCOUNTER — Other Ambulatory Visit: Payer: Self-pay | Admitting: Family Medicine

## 2014-09-21 ENCOUNTER — Telehealth: Payer: Self-pay

## 2014-09-21 DIAGNOSIS — Z8709 Personal history of other diseases of the respiratory system: Secondary | ICD-10-CM

## 2014-09-21 DIAGNOSIS — M159 Polyosteoarthritis, unspecified: Secondary | ICD-10-CM

## 2014-09-21 DIAGNOSIS — M150 Primary generalized (osteo)arthritis: Principal | ICD-10-CM

## 2014-09-21 NOTE — Telephone Encounter (Signed)
Call from [place] with [facility] requesting a verbal for OT and Social work for Transportation, she was just released from [facility]. Verbal given.      KP

## 2014-09-21 NOTE — Telephone Encounter (Signed)
Ok to order 24in wheelchair

## 2014-09-21 NOTE — Telephone Encounter (Signed)
Please advise      KP 

## 2014-09-21 NOTE — Telephone Encounter (Signed)
Caller name: randy Relation to pt: son Call back number:  [phone] Pharmacy:  Reason for call:   Patient son is wanting to know if Dr. Etter Sjogren can order a wheelchair(24 inch) for patient. The wheelchair that she has now is too big and cannot go through the doorways

## 2014-09-23 NOTE — Progress Notes (Signed)
Patient ID: Kirsten Flores, female   DOB: Jan 21, 1924, 81 y.o.   MRN: [id number]   09/14/14  Facility:  Nursing Home Location:  [facility] Room Number: 705-P LEVEL OF CARE:  SNF (31)   Chief Complaint  Patient presents with  . Discharge Note    Physical deconditioning, COPD, OSA, CHF, hypertension, GERD, hypothyroidism, hyperlipidemia, neuropathy, anemia, depression, hypokalemia and protein calorie malnutrition    HISTORY OF PRESENT ILLNESS:  This is an 81 year old female who is for discharge home with Home health PT and CNA. DME:  CPAP Machine. She has been admitted to [facility] on 08/26/14 from [facility] She has PMH of hyperlipidemia, hypertension, hypothyroidism, morbid obesity and COPD. She presented to the ER with sudden progressive SOB and cough. She was recently hospitalized due to COPD exacerbation with acute hypercarbia. She was discharged to a nursing home with tapering prednisone and O2 at 2 L/min. Son reports that O2 at home is 4 L/min. Acute on chronic hypoxic and hypercarbic respiratory failure was thought to be multi-factorial to include OSA, polypharmacy, COPD exacerbation, acute on chronic diastolic heart failure, sporadic respiratory pneumonitis and incorrect SNF O2 regimen.  Patient was admitted to this facility for short-term rehabilitation after the patient's recent hospitalization.  Patient has completed SNF rehabilitation and therapy has cleared the patient for discharge.  PAST MEDICAL HISTORY:  Past Medical History  Diagnosis Date  . Hyperlipidemia   . Hypertension   . Osteopenia   . Thyroid disease   . B12 deficiency   . Arthritis   . Memory loss   . Knee pain     RIGHT  . Gait disturbance     CURRENT MEDICATIONS: Reviewed per MAR/see medication list  Allergies  Allergen Reactions  . Diltiazem Hcl Other (See Comments)    Unknown reaction  . Neomycin Other (See Comments)    Unknown reaction (a long time ago)      REVIEW OF SYSTEMS:  GENERAL: no change in appetite, no fatigue, no weight changes, no fever, chills or weakness RESPIRATORY: no cough, SOB, DOE, wheezing, hemoptysis CARDIAC: no chest pain, or palpitations GI: no abdominal pain, diarrhea, constipation, heart burn, nausea or vomiting  PHYSICAL EXAMINATION  GENERAL: no acute distress,  Obese SKIN:  Dry yellowish lesion on forehead, dry BLE NECK: supple, trachea midline, no neck masses, no thyroid tenderness, no thyromegaly LYMPHATICS: no LAN in the neck, no supraclavicular LAN RESPIRATORY: breathing is even & unlabored, BS CTAB CARDIAC: RRR, no murmur,no extra heart sounds, BLE edema 1+ GI: abdomen soft, normal BS, no masses, no tenderness, no hepatomegaly, no splenomegaly EXTREMITIES:  Able to move 4 extremities PSYCHIATRIC: the patient is alert & oriented to person, affect & behavior appropriate  LABS/RADIOLOGY: Labs reviewed: 09/12/14  NA 142  K 3.8  Glucose 90  BUN 13  Creatinine 0.69  CA 9.1 09/04/14  NA 145  K 3.3  Glucose 91  BUN 12  Creatinine 0.60  CA 9.0 Basic Metabolic Panel:  Recent Labs  08/19/14 0256 08/20/14 0301 08/21/14 0600 08/22/14 0539 08/24/14 0529  NA 144 143 143 144 144  K 3.4* 3.5 3.7 3.4* 3.8  CL 97* 95* 95* 92* 96*  CO2 42* 45* 43* 44* 37*  GLUCOSE 95 76 83 83 86  BUN 9 6 7 9 10   CREATININE 0.55 0.63 0.49 0.58 0.66  CALCIUM 9.0 8.7* 8.8* 8.9 9.3  MG 1.8 1.6* 2.1  --   --    Liver  Function Tests:  Recent Labs  08/18/14 0557 08/19/14 0256 08/20/14 0301  AST 27 28 26   ALT 22 23 23   ALKPHOS 36* 37* 33*  BILITOT 0.8 0.8 0.7  PROT 4.6* 4.4* 4.5*  ALBUMIN 2.2* 2.2* 2.2*   CBC:  Recent Labs  08/18/14 0557 08/19/14 0256 08/20/14 0301  WBC 2.7* 2.6* 2.9*  NEUTROABS 1.7 1.8 2.0  HGB 9.8* 10.1* 9.5*  HCT 32.0* 33.4* 30.3*  MCV 103.6* 104.0* 103.1*  PLT 127* 126* 125*   Lipid Panel:  Recent Labs  10/30/13 1552 05/01/14 1657  HDL 27.30* 38.10*   Cardiac Enzymes:  Recent  Labs  08/17/14 1923 08/18/14 0029 08/18/14 0557  TROPONINI 0.05* 0.06* 0.06*   CBG:  Recent Labs  07/13/14 0806 07/14/14 0733 07/24/14 1123  GLUCAP 101* 109* 144*     ASSESSMENT/PLAN:  Physical deconditioning - for Home health PT and CNA  COPD exacerbation - continue Mucinex 600 mg 1 tab by mouth twice a day, DuoNeb 2.5 mg/3 mL 1 neck every 4 hours when necessary and Pulmicort 0.25 mg/2 mL 19 twice a day; completed 7 days and to be optic course; continue O2 at 4 L/minute; palliative consult with [facility]   OSA - continue CPAP at night  Chronic diastolic heart failure - continue Lasix 40 mg by mouth daily   GERD - continue Protonix 40 mg 1 tab by mouth daily  Hypothyroidism - TSH 0.43; continue levothyroxine 125 g by mouth daily  Hyperlipidemia - continue Lipitor 10 mg 1 tab by mouth daily and TriCor 145 mg 1 tab by mouth daily  Neuropathy - continue Neurontin 600 mg 1 tab by mouth daily at bedtime  Anemia of chronic disease - hemoglobin 9.5; stable  Major Depression - mood is stable; continue Zoloft 50 mg 1 tab by mouth daily  Protein calorie malnutrition, severe - albumin 2.2; referred to our D; continue supplementation  Hypokalemia - K 3.8; continue KCL 20 meq 1 tab PO Q D    I have filled out patient's discharge paperwork and written prescriptions.  Patient will receive home health PT and CNA.  DME provided:  CPAP Machine  Total discharge time: Greater than 30 minutes  Discharge time involved coordination of the discharge process with Education officer, museum, nursing staff and therapy department. Medical justification for home health services/DME verified.    [facility], NP [employer] [phone]

## 2014-09-25 MED ORDER — WHEELCHAIR MISC: Status: AC

## 2014-09-25 NOTE — Telephone Encounter (Signed)
The order has been faxed to [facility].     KP

## 2014-09-25 NOTE — Telephone Encounter (Signed)
Caller name: [name]  Relation to pt:son Call back number: [phone]   Reason for call:  Son called to check on the status of wheelchair request. Would like office to order from [place]

## 2014-09-26 ENCOUNTER — Telehealth: Payer: Self-pay | Admitting: Family Medicine

## 2014-09-26 NOTE — Telephone Encounter (Signed)
Attempted to give verbal order and received voicemail that said I had reached Kirsten Flores so left message to return my call.

## 2014-09-26 NOTE — Telephone Encounter (Signed)
Caller name: Jinny Blossom  Relation to pt: skilled nurse from [facility] Call back number: [phone]   Reason for call:  Requesting verbal orders for twice next week and once a week after for the next 3 weeks.

## 2014-09-28 ENCOUNTER — Telehealth: Payer: Self-pay | Admitting: *Deleted

## 2014-09-28 NOTE — Telephone Encounter (Signed)
Received home health certification and plan of care via fax from [facility] for certification period: 09/20/14 - 11/18/14. Forwarded to Dr. Etter Sjogren. JG//CMA

## 2014-09-28 NOTE — Telephone Encounter (Signed)
Faxed to [facility] successfully. Sent for scanning. JG//CMA

## 2014-10-01 ENCOUNTER — Telehealth: Payer: Self-pay | Admitting: *Deleted

## 2014-10-01 DIAGNOSIS — N390 Urinary tract infection, site not specified: Secondary | ICD-10-CM | POA: Diagnosis not present

## 2014-10-01 NOTE — Telephone Encounter (Signed)
Received addendum to home health cert & POC via fax from [facility]. Certification period is 07/15/14 - 09/12/14. Forwarded to Dr. Etter Sjogren. JG//CMA

## 2014-10-02 ENCOUNTER — Telehealth: Payer: Self-pay | Admitting: Family Medicine

## 2014-10-02 NOTE — Telephone Encounter (Signed)
I re-faxed the orders to [facility].

## 2014-10-02 NOTE — Telephone Encounter (Signed)
Caller name:Margie[facility] Relation to YV:OPFY Call back [phone] Pharmacy:  Reason for call: [facility] and order for the pt for a urine test, pt thinks she has a UTI. Requesting a verbal request if possible Lesleigh Noe states she is making a home visit today.

## 2014-10-02 NOTE — Telephone Encounter (Signed)
Caller name:Randy Brayton Caves Relation to pt:son Call back [place]:  Reason for call: pt son would like to find out if there is anyway to request a smaller wheelchair for his mother, states that the one they have is to wide for her to get around in the house.

## 2014-10-02 NOTE — Telephone Encounter (Signed)
Verbal given and she will make Kirsten Flores aware that the wheel chair has been ordered.     KP

## 2014-10-04 ENCOUNTER — Telehealth: Payer: Self-pay | Admitting: Family Medicine

## 2014-10-04 NOTE — Telephone Encounter (Signed)
Verbal given for wound care.      KP

## 2014-10-04 NOTE — Telephone Encounter (Signed)
Faxed successfully to [facility]. Sent for scanning. JG//CMA  

## 2014-10-04 NOTE — Telephone Encounter (Signed)
Caller name: Meghan  Relation to pt: RN from [place]  Call back number: [phone]   Reason for call:  Requesting verbal orders for 2 wounds 1 on the interior right calf and 1 on the posterior left calf

## 2014-10-08 ENCOUNTER — Telehealth: Payer: Self-pay | Admitting: Family Medicine

## 2014-10-08 MED ORDER — SULFAMETHOXAZOLE-TRIMETHOPRIM 800-160 MG PO TABS: 1.0000 | ORAL_TABLET | Freq: Two times a day (BID) | ORAL | Status: DC

## 2014-10-08 NOTE — Telephone Encounter (Signed)
Yes-- + UTI Bactrim ds 1 po bid x 7 days

## 2014-10-08 NOTE — Telephone Encounter (Signed)
Pt is running fever of 100.5 right now. He wants to know if the urine test from last week showed UTI or something evident of infection. [facility] dropped off kit & he dropped at our lab last week. Please call him asap to advise what needs done.

## 2014-10-08 NOTE — Telephone Encounter (Signed)
Please review and advise     KP 

## 2014-10-08 NOTE — Telephone Encounter (Signed)
Rx faxed and detailed message left on VM making the patient aware.  Spoke with [name] and made him aware on the cell phone, he said her temp was 99.8 when he left and he was picking her up some soup, he will stop by the pharmacy and will call back.    KP

## 2014-10-09 ENCOUNTER — Other Ambulatory Visit: Payer: Self-pay | Admitting: Family Medicine

## 2014-10-09 DIAGNOSIS — E876 Hypokalemia: Secondary | ICD-10-CM

## 2014-10-10 MED ORDER — POTASSIUM CHLORIDE CRYS ER 20 MEQ PO TBCR: 20.0000 meq | EXTENDED_RELEASE_TABLET | Freq: Every day | ORAL | Status: DC

## 2014-10-10 NOTE — Telephone Encounter (Signed)
Pt will call back tomorrow and schedule and appt that works with son's schedule.

## 2014-10-10 NOTE — Telephone Encounter (Signed)
Dr Etter Sjogren,  Received request from pharmacy for potassium 20 meq once a day, epic shows med was stopped at discharge in [place], denial was sent to pharmacy today, please advise if pt should be taking medication.

## 2014-10-10 NOTE — Telephone Encounter (Signed)
She should be taking K Ok to refillx1  5 refills Check bmp in 4 weeks

## 2014-10-11 NOTE — Telephone Encounter (Signed)
Kim this pt is going to call back to schedule a BMP for four weeks per Dr. Etter Sjogren.

## 2014-10-15 ENCOUNTER — Encounter: Payer: Self-pay | Admitting: Family Medicine

## 2014-10-18 ENCOUNTER — Telehealth: Payer: Self-pay | Admitting: Family Medicine

## 2014-10-18 NOTE — Telephone Encounter (Signed)
Caller name: Shaunda  Relation to pt: PT from [place] Call back number: [phone]    Reason for call: requesting verbal orders continuing physical therapy for twice a week for 3 weeks and once a week for 1 week

## 2014-10-19 NOTE — Telephone Encounter (Signed)
Elie Confer has been given the verbal order.

## 2014-10-22 ENCOUNTER — Other Ambulatory Visit: Payer: Medicare Other

## 2014-10-24 ENCOUNTER — Telehealth: Payer: Self-pay | Admitting: Family Medicine

## 2014-10-24 NOTE — Telephone Encounter (Signed)
Caller name: Meghan  Relation to pt: RN from [place] Call back number: [phone]    Reason for call:  As per RN patient may have a UTI experc. dark urine, foul order and confusion. Requesting verbal orders for U/S and skilled nursing to continue thru sept 18th.

## 2014-10-25 NOTE — Telephone Encounter (Signed)
Urine cloudy and has an odor, verbal given to recheck Urine due to 10/08/14 +UTI and verbal given to continue skilled nursing.     KP

## 2014-10-26 NOTE — Telephone Encounter (Signed)
Call from [facility] and she stated [name] declined services because the patient is home alone, he only wants the nurses to come between 11 and 2 pm. She said she will go out to the home tomorrow.    KP

## 2014-11-01 ENCOUNTER — Telehealth: Payer: Self-pay | Admitting: *Deleted

## 2014-11-01 ENCOUNTER — Telehealth: Payer: Self-pay | Admitting: Family Medicine

## 2014-11-01 NOTE — Telephone Encounter (Signed)
Pt dropped off disability parking placard. Filled out as much as possible and forwarded to Dr. Etter Sjogren. JG//CMA

## 2014-11-01 NOTE — Telephone Encounter (Signed)
Caller name: Jinny Blossom ([facility])  Relationship to patient:  Can be reached: [phone] Pharmacy:  Reason for call: want to know if you have received the urine culture for this patient.

## 2014-11-01 NOTE — Telephone Encounter (Signed)
i would have put it on your desk if I had received it

## 2014-11-01 NOTE — Telephone Encounter (Signed)
Kirsten Flores will have the results re-faxed and have them attn: to Me.     KP

## 2014-11-01 NOTE — Telephone Encounter (Signed)
Dr.Lowne have you see these results.     KP

## 2014-11-06 ENCOUNTER — Other Ambulatory Visit: Payer: Medicare Other

## 2014-11-06 NOTE — Telephone Encounter (Signed)
Placed up front for pick up. Copy sent for scanning. JG//CMA

## 2014-11-12 ENCOUNTER — Other Ambulatory Visit: Payer: Self-pay | Admitting: Family Medicine

## 2014-11-13 ENCOUNTER — Encounter: Payer: Self-pay | Admitting: Emergency Medicine

## 2014-11-22 ENCOUNTER — Telehealth: Payer: Self-pay | Admitting: Family Medicine

## 2014-11-22 ENCOUNTER — Emergency Department (HOSPITAL_COMMUNITY): Payer: Medicare Other

## 2014-11-22 ENCOUNTER — Encounter (HOSPITAL_COMMUNITY): Payer: Self-pay | Admitting: *Deleted

## 2014-11-22 ENCOUNTER — Observation Stay (HOSPITAL_COMMUNITY)
Admission: EM | Admit: 2014-11-22 | Discharge: 2014-11-24 | Disposition: A | Discharge status: Home / Self Care | Payer: Medicare Other | Attending: Internal Medicine | Admitting: Internal Medicine

## 2014-11-22 DIAGNOSIS — E039 Hypothyroidism, unspecified: Secondary | ICD-10-CM | POA: Diagnosis present

## 2014-11-22 DIAGNOSIS — E785 Hyperlipidemia, unspecified: Secondary | ICD-10-CM | POA: Diagnosis not present

## 2014-11-22 DIAGNOSIS — Z85828 Personal history of other malignant neoplasm of skin: Secondary | ICD-10-CM | POA: Diagnosis not present

## 2014-11-22 DIAGNOSIS — Z87891 Personal history of nicotine dependence: Secondary | ICD-10-CM | POA: Diagnosis not present

## 2014-11-22 DIAGNOSIS — E538 Deficiency of other specified B group vitamins: Secondary | ICD-10-CM | POA: Diagnosis not present

## 2014-11-22 DIAGNOSIS — R413 Other amnesia: Secondary | ICD-10-CM

## 2014-11-22 DIAGNOSIS — N390 Urinary tract infection, site not specified: Secondary | ICD-10-CM | POA: Diagnosis not present

## 2014-11-22 DIAGNOSIS — J9621 Acute and chronic respiratory failure with hypoxia: Secondary | ICD-10-CM | POA: Insufficient documentation

## 2014-11-22 DIAGNOSIS — M1990 Unspecified osteoarthritis, unspecified site: Secondary | ICD-10-CM | POA: Insufficient documentation

## 2014-11-22 DIAGNOSIS — M8580 Other specified disorders of bone density and structure, unspecified site: Secondary | ICD-10-CM | POA: Insufficient documentation

## 2014-11-22 DIAGNOSIS — Z79899 Other long term (current) drug therapy: Secondary | ICD-10-CM | POA: Insufficient documentation

## 2014-11-22 DIAGNOSIS — J9611 Chronic respiratory failure with hypoxia: Secondary | ICD-10-CM | POA: Insufficient documentation

## 2014-11-22 DIAGNOSIS — I5032 Chronic diastolic (congestive) heart failure: Secondary | ICD-10-CM | POA: Diagnosis present

## 2014-11-22 DIAGNOSIS — R531 Weakness: Secondary | ICD-10-CM | POA: Diagnosis present

## 2014-11-22 DIAGNOSIS — J449 Chronic obstructive pulmonary disease, unspecified: Secondary | ICD-10-CM | POA: Diagnosis not present

## 2014-11-22 DIAGNOSIS — I10 Essential (primary) hypertension: Secondary | ICD-10-CM | POA: Diagnosis not present

## 2014-11-22 DIAGNOSIS — N179 Acute kidney failure, unspecified: Secondary | ICD-10-CM | POA: Diagnosis present

## 2014-11-22 HISTORY — DX: Chronic obstructive pulmonary disease, unspecified: J44.9

## 2014-11-22 HISTORY — DX: Malignant (primary) neoplasm, unspecified: C80.1

## 2014-11-22 LAB — COMPREHENSIVE METABOLIC PANEL
ALBUMIN: 2.7 g/dL — AB (ref 3.5–5.0)
ALK PHOS: 48 U/L (ref 38–126)
ALT: 21 U/L (ref 14–54)
AST: 49 U/L — AB (ref 15–41)
Anion gap: 5 (ref 5–15)
BUN: 19 mg/dL (ref 6–20)
CO2: 34 mmol/L — ABNORMAL HIGH (ref 22–32)
Calcium: 8.9 mg/dL (ref 8.9–10.3)
Chloride: 105 mmol/L (ref 101–111)
Creatinine, Ser: 1.13 mg/dL — ABNORMAL HIGH (ref 0.44–1.00)
GFR calc Af Amer: 48 mL/min — ABNORMAL LOW (ref >60)
GFR calc non Af Amer: 42 mL/min — ABNORMAL LOW (ref >60)
GLUCOSE: 115 mg/dL — AB (ref 65–99)
POTASSIUM: 4.2 mmol/L (ref 3.5–5.1)
Sodium: 144 mmol/L (ref 135–145)
TOTAL PROTEIN: 5.7 g/dL — AB (ref 6.5–8.1)
Total Bilirubin: 0.9 mg/dL (ref 0.3–1.2)

## 2014-11-22 LAB — CBC WITH DIFFERENTIAL/PLATELET
BASOS PCT: 0 %
Basophils Absolute: 0 10*3/uL (ref 0.0–0.1)
EOS ABS: 0.1 10*3/uL (ref 0.0–0.7)
EOS PCT: 1 %
HEMATOCRIT: 29.2 % — AB (ref 36.0–46.0)
HEMOGLOBIN: 8.8 g/dL — AB (ref 12.0–15.0)
LYMPHS PCT: 13 %
Lymphs Abs: 1.1 10*3/uL (ref 0.7–4.0)
MCH: 31.7 pg (ref 26.0–34.0)
MCHC: 30.1 g/dL (ref 30.0–36.0)
MCV: 105 fL — ABNORMAL HIGH (ref 78.0–100.0)
MONO ABS: 0.8 10*3/uL (ref 0.1–1.0)
MONOS PCT: 10 %
Neutro Abs: 6.4 10*3/uL (ref 1.7–7.7)
Neutrophils Relative %: 76 %
Platelets: 161 10*3/uL (ref 150–400)
RBC: 2.78 MIL/uL — AB (ref 3.87–5.11)
RDW: 15.9 % — ABNORMAL HIGH (ref 11.5–15.5)
WBC: 8.4 10*3/uL (ref 4.0–10.5)

## 2014-11-22 LAB — URINALYSIS, ROUTINE W REFLEX MICROSCOPIC
Glucose, UA: NEGATIVE mg/dL
Ketones, ur: NEGATIVE mg/dL
Nitrite: NEGATIVE
PROTEIN: 100 mg/dL — AB
SPECIFIC GRAVITY, URINE: 1.019 (ref 1.005–1.030)
UROBILINOGEN UA: 2 mg/dL — AB (ref 0.0–1.0)
pH: 5.5 (ref 5.0–8.0)

## 2014-11-22 LAB — URINE MICROSCOPIC-ADD ON

## 2014-11-22 LAB — I-STAT CG4 LACTIC ACID, ED: LACTIC ACID, VENOUS: 0.57 mmol/L (ref 0.5–2.0)

## 2014-11-22 MED ORDER — GABAPENTIN 300 MG PO CAPS
600.0000 mg | ORAL_CAPSULE | Freq: Every day | ORAL | Status: DC
  Administered 2014-11-22 – 2014-11-23 (×2): 600 mg via ORAL
  Filled 2014-11-22 (×4): qty 2

## 2014-11-22 MED ORDER — SODIUM CHLORIDE 0.9 % IV BOLUS (SEPSIS)
1000.0000 mL | Freq: Once | INTRAVENOUS | Status: AC
  Administered 2014-11-22: 1000 mL via INTRAVENOUS

## 2014-11-22 MED ORDER — PANTOPRAZOLE SODIUM 40 MG PO TBEC
40.0000 mg | DELAYED_RELEASE_TABLET | Freq: Every day | ORAL | Status: DC
  Administered 2014-11-23 – 2014-11-24 (×2): 40 mg via ORAL
  Filled 2014-11-22 (×2): qty 1

## 2014-11-22 MED ORDER — FOSFOMYCIN TROMETHAMINE 3 G PO PACK
3.0000 g | PACK | Freq: Once | ORAL | Status: AC
  Administered 2014-11-22: 3 g via ORAL
  Filled 2014-11-22: qty 3

## 2014-11-22 MED ORDER — ENOXAPARIN SODIUM 40 MG/0.4ML ~~LOC~~ SOLN
40.0000 mg | Freq: Every day | SUBCUTANEOUS | Status: DC
  Administered 2014-11-22 – 2014-11-23 (×2): 40 mg via SUBCUTANEOUS
  Filled 2014-11-22 (×2): qty 0.4

## 2014-11-22 MED ORDER — FENOFIBRATE 160 MG PO TABS
160.0000 mg | ORAL_TABLET | Freq: Every day | ORAL | Status: DC
  Administered 2014-11-23 – 2014-11-24 (×2): 160 mg via ORAL
  Filled 2014-11-22 (×2): qty 1

## 2014-11-22 MED ORDER — BETAXOLOL HCL 0.25 % OP SUSP
1.0000 [drp] | Freq: Two times a day (BID) | OPHTHALMIC | Status: DC
Start: 2014-11-22 — End: 2014-11-24
  Administered 2014-11-22 – 2014-11-24 (×4): 1 [drp] via OPHTHALMIC
  Filled 2014-11-22: qty 10

## 2014-11-22 MED ORDER — SODIUM CHLORIDE 0.9 % IV SOLN
Freq: Once | INTRAVENOUS | Status: AC
  Administered 2014-11-22: 19:00:00 via INTRAVENOUS

## 2014-11-22 MED ORDER — LEVOTHYROXINE SODIUM 25 MCG PO TABS
125.0000 ug | ORAL_TABLET | Freq: Every day | ORAL | Status: DC
  Administered 2014-11-23 – 2014-11-24 (×2): 125 ug via ORAL
  Filled 2014-11-22 (×4): qty 1

## 2014-11-22 MED ORDER — DEXTROSE 5 % IV SOLN
1.0000 g | Freq: Once | INTRAVENOUS | Status: DC
  Filled 2014-11-22: qty 10

## 2014-11-22 MED ORDER — SODIUM CHLORIDE 0.9 % IV SOLN
INTRAVENOUS | Status: DC
  Administered 2014-11-22 – 2014-11-23 (×2): via INTRAVENOUS

## 2014-11-22 MED ORDER — SERTRALINE HCL 50 MG PO TABS
50.0000 mg | ORAL_TABLET | Freq: Every day | ORAL | Status: DC
  Administered 2014-11-23 – 2014-11-24 (×2): 50 mg via ORAL
  Filled 2014-11-22 (×2): qty 1

## 2014-11-22 MED ORDER — ATORVASTATIN CALCIUM 10 MG PO TABS
10.0000 mg | ORAL_TABLET | Freq: Every day | ORAL | Status: DC
  Administered 2014-11-22 – 2014-11-23 (×2): 10 mg via ORAL
  Filled 2014-11-22: qty 1

## 2014-11-22 MED ORDER — SULFAMETHOXAZOLE-TRIMETHOPRIM 800-160 MG PO TABS
1.0000 | ORAL_TABLET | Freq: Once | ORAL | Status: AC
  Administered 2014-11-22: 1 via ORAL
  Filled 2014-11-22: qty 1

## 2014-11-22 MED ORDER — CLOPIDOGREL BISULFATE 75 MG PO TABS
75.0000 mg | ORAL_TABLET | Freq: Every day | ORAL | Status: DC
  Administered 2014-11-23 – 2014-11-24 (×2): 75 mg via ORAL
  Filled 2014-11-22 (×2): qty 1

## 2014-11-22 NOTE — Telephone Encounter (Signed)
Check with son tomorrow

## 2014-11-22 NOTE — ED Notes (Addendum)
Per ems pt is from home, urinary odor, pt denies pain, increased weakness x2 days. Pt wears 6L [place], son and pt reports she is not SOB.  Pt dropped to 84% on room air within 1 minute off oxygen. Mild swelling to feet and ankles, which son says is normal.  Son reports pt has large skin cancer to forehead, pt denied treatment. Pt has living will that states "she desires to die a natural death".  Pt alert and oriented x4.   20 G L wrist

## 2014-11-22 NOTE — Progress Notes (Signed)
EDCM spoke to patient at bedside.  No family present.  Patient reports she lives at home with her son Kirsten Flores.  Patient confirms she wears 5 liters of oxygen "all the time" at home.  Patient reports her oxygen is supplied by [facility].  Patient reports she was receiving PT at home but was discharged because she was doing better.  Patient cannot remember the name of the agency she was receiving home health services with .  Patient reports "There are 3-4 women who still come on different days.  My son takes care of all that."  Patient reports the women assist her with ADL's.  Patient reports she has a bedside commode, wheelchair, scooter, walker, hospital bed and shower chair at home.  No further EDCM needs at this time.

## 2014-11-22 NOTE — ED Notes (Signed)
Pt to xray

## 2014-11-22 NOTE — ED Provider Notes (Signed)
CSN: [id number]     Arrival date & time 11/22/14  1643 History   First MD Initiated Contact with Patient 11/22/14 1708     Chief Complaint  Patient presents with  . Urinary Tract Infection     (Consider location/radiation/quality/duration/timing/severity/associated sxs/prior Treatment) Patient is a 81 y.o. female presenting with urinary tract infection and general illness. The history is provided by the patient.  Urinary Tract Infection Associated symptoms: no abdominal pain, no nausea and no vomiting   Illness Severity:  Moderate Onset quality:  Sudden Duration:  3 days Timing:  Constant Progression:  Worsening Chronicity:  New Associated symptoms: fatigue   Associated symptoms: no abdominal pain, no chest pain, no congestion, no cough, no loss of consciousness, no myalgias, no nausea, no shortness of breath and no vomiting    81 yo F with a chief complaint of weakness. Patient states this been going on for the past 3 days. Difficulty walking at home. Patient usually uses a walker and walks with assistance but has been having trouble doing that for the last couple days. Denies fevers or chills. Denies abdominal pain. Patient has a history of COPD and is normally on 5 L of oxygen at all times. Patient denies cough chest pain shortness breath. Denies recent head injury.  Past Medical History  Diagnosis Date  . Hyperlipidemia   . Hypertension   . Osteopenia   . Thyroid disease   . B12 deficiency   . Arthritis   . Memory loss   . Knee pain     RIGHT  . Gait disturbance   . COPD (chronic obstructive pulmonary disease)   . Cancer     Squamous cell skin cancer, no treatment   Past Surgical History  Procedure Laterality Date  . Appendectomy    . Cholecystectomy    . Abdominal hysterectomy    . Oophorectomy     Family History  Problem Relation Age of Onset  . Colon cancer    . Melanoma    . Cancer Other     COLON...1ST DEGREE RELATIVE   Social History  Substance Use  Topics  . Smoking status: Former Smoker -- 2.00 packs/day for 25 years    Types: Cigarettes    Quit date: 03/03/1971  . Smokeless tobacco: Never Used  . Alcohol Use: 0.0 oz/week    0 Standard drinks or equivalent per week     Comment: 1 cocktail in the evening and a glass of wine - not everyday but doesn't state how often   OB History    No data available     Review of Systems  Constitutional: Positive for fatigue.  HENT: Negative for congestion.   Respiratory: Negative for cough and shortness of breath.   Cardiovascular: Negative for chest pain.  Gastrointestinal: Negative for nausea, vomiting and abdominal pain.  Musculoskeletal: Negative for myalgias.  Neurological: Positive for weakness. Negative for loss of consciousness.      Allergies  Diltiazem hcl and Neomycin  Home Medications   Prior to Admission medications   Medication Sig Start Date End Date Taking? Authorizing Provider  atorvastatin (LIPITOR) 10 MG tablet Take 1 tablet (10 mg total) by mouth daily. 05/28/14  Yes Yvonne R Lowne, DO  betaxolol (BETOPTIC-S) 0.25 % ophthalmic suspension Place 1 drop into both eyes 2 (two) times daily.     Yes Historical Provider, MD  Cholecalciferol (VITAMIN D PO) Take 1 tablet by mouth daily.   Yes Historical Provider, MD  clopidogrel (PLAVIX) 75 MG  tablet TAKE 1 BY MOUTH DAILY WITH BREAKFAST Patient taking differently: TAKE 75 MG BY MOUTH DAILY WITH BREAKFAST 10/10/14  Yes Yvonne R Lowne, DO  dextromethorphan-guaiFENesin (MUCINEX DM) 30-600 MG per 12 hr tablet Take 1 tablet by mouth 2 (two) times daily. 08/26/14  Yes Geradine Girt, DO  fenofibrate (TRICOR) 145 MG tablet TAKE 1 BY MOUTH DAILY Patient taking differently: TAKE 145 MG BY MOUTH DAILY 06/25/14  Yes Yvonne R Lowne, DO  gabapentin (NEURONTIN) 600 MG tablet Take 1 tablet (600 mg total) by mouth at bedtime. 10/24/13  Yes Yvonne R Lowne, DO  Ibuprofen-Diphenhydramine HCl (ADVIL PM) 200-25 MG CAPS Take 2 tablets by mouth at  bedtime as needed (sleep).   Yes Historical Provider, MD  levothyroxine (SYNTHROID, LEVOTHROID) 125 MCG tablet Take 125 mcg by mouth daily before breakfast.   Yes Historical Provider, MD  Misc. Devices [facility]) MISC 24 inch wheel chair 09/25/14  Yes Rosalita Chessman, DO  Multiple Vitamin (MULTIVITAMIN WITH MINERALS) TABS tablet Take 1 tablet by mouth daily.   Yes Historical Provider, MD  pantoprazole (PROTONIX) 40 MG tablet TAKE 1 BY MOUTH DAILY Patient taking differently: TAKE 40 MG BY MOUTH DAILY 11/13/14  Yes Alferd Apa Lowne, DO  potassium chloride SA (K-DUR,KLOR-CON) 20 MEQ tablet Take 1 tablet (20 mEq total) by mouth daily. 10/10/14  Yes Yvonne R Lowne, DO  sertraline (ZOLOFT) 50 MG tablet Take 1 tablet (50 mg total) by mouth daily. 10/24/13  Yes Yvonne R Lowne, DO   BP 137/51 mmHg  Pulse 89  Temp(Src) 99.2 F (37.3 C) (Oral)  Resp 18  Ht 5\' 5"  (1.651 m)  Wt 188 lb 7.9 oz (85.5 kg)  BMI 31.37 kg/m2  SpO2 90% Physical Exam  Constitutional: She is oriented to person, place, and time. She appears well-developed and well-nourished. No distress.  HENT:  Head: Normocephalic and atraumatic.  Eyes: EOM are normal. Pupils are equal, round, and reactive to light.  Neck: Normal range of motion. Neck supple.  Cardiovascular: Normal rate and regular rhythm.  Exam reveals no gallop and no friction rub.   No murmur heard. Pulmonary/Chest: Effort normal. She has no wheezes. She has no rales.  Abdominal: Soft. She exhibits no distension. There is no tenderness. There is no rebound.  Musculoskeletal: She exhibits no edema or tenderness.  Neurological: She is alert and oriented to person, place, and time.  Skin: Skin is warm and dry. She is not diaphoretic.  Psychiatric: She has a normal mood and affect. Her behavior is normal.    ED Course  Procedures (including critical care time) Labs Review Labs Reviewed  COMPREHENSIVE METABOLIC PANEL - Abnormal; Notable for the following:    CO2 34 (*)     Glucose, Bld 115 (*)    Creatinine, Ser 1.13 (*)    Total Protein 5.7 (*)    Albumin 2.7 (*)    AST 49 (*)    GFR calc non Af Amer 42 (*)    GFR calc Af Amer 48 (*)    All other components within normal limits  CBC WITH DIFFERENTIAL/PLATELET - Abnormal; Notable for the following:    RBC 2.78 (*)    Hemoglobin 8.8 (*)    HCT 29.2 (*)    MCV 105.0 (*)    RDW 15.9 (*)    All other components within normal limits  URINALYSIS, ROUTINE W REFLEX MICROSCOPIC (NOT AT [facility]) - Abnormal; Notable for the following:    Color, Urine AMBER (*)  APPearance CLOUDY (*)    Hgb urine dipstick MODERATE (*)    Bilirubin Urine SMALL (*)    Protein, ur 100 (*)    Urobilinogen, UA 2.0 (*)    Leukocytes, UA LARGE (*)    All other components within normal limits  URINE MICROSCOPIC-ADD ON - Abnormal; Notable for the following:    Bacteria, UA MANY (*)    All other components within normal limits  URINE CULTURE  BASIC METABOLIC PANEL  CREATININE, URINE, RANDOM  SODIUM, URINE, RANDOM  I-STAT CG4 LACTIC ACID, ED    Imaging Review Dg Chest 2 View  11/22/2014   CLINICAL DATA:  Increased weakness for the past 2 days.  Ex-smoker.  EXAM: CHEST  2 VIEW  COMPARISON:  08/22/2014.  FINDINGS: Stable enlarged cardiac silhouette. Prominent pulmonary vasculature. Small amount of right pleural thickening or fluid. Minimal bibasilar atelectasis or scarring. Mildly prominent interstitial markings with improvement. Diffuse peribronchial thickening. Diffuse osteopenia.  IMPRESSION: 1. Cardiomegaly and mild pulmonary vascular congestion. 2. Small amount of right pleural fluid or thickening. 3. Mild chronic interstitial lung disease and chronic bronchitic changes. 4. Small amount of atelectasis or scarring at both lung bases.   Electronically Signed   By: Claudie Revering M.D.   On: 11/22/2014 18:36   I have personally reviewed and evaluated these images and lab results as part of my medical decision-making.   EKG  Interpretation   Date/Time:  Thursday November 22 2014 16:58:53 EDT Ventricular Rate:  90 PR Interval:  187 QRS Duration: 88 QT Interval:  344 QTC Calculation: 421 R Axis:   -58 Text Interpretation:  Sinus rhythm LAD, consider left anterior fascicular  block Probable anteroseptal infarct, old No significant change since last  tracing Confirmed by FLOYD MD, DANIEL [phone]) on 11/22/2014 5:12:13 PM      MDM   Final diagnoses:  UTI (lower urinary tract infection)    81 yo F with a chief complaint of generalized weakness. No noted focal findings on exam. Patient found to have a urinary tract infection on UA. Will treat with Rocephin. Admit to the hospital.  The patients results and plan were reviewed and discussed.   Any x-rays performed were independently reviewed by myself.   Differential diagnosis were considered with the presenting HPI.  Medications  pantoprazole (PROTONIX) EC tablet 40 mg (not administered)  clopidogrel (PLAVIX) tablet 75 mg (not administered)  levothyroxine (SYNTHROID, LEVOTHROID) tablet 125 mcg (not administered)  fenofibrate tablet 160 mg (not administered)  atorvastatin (LIPITOR) tablet 10 mg (10 mg Oral Given 11/22/14 2347)  gabapentin (NEURONTIN) capsule 600 mg (600 mg Oral Given 11/22/14 2345)  sertraline (ZOLOFT) tablet 50 mg (not administered)  betaxolol (BETOPTIC-S) 0.25 % ophthalmic suspension 1 drop (1 drop Both Eyes Given 11/22/14 2345)  enoxaparin (LOVENOX) injection 40 mg (40 mg Subcutaneous Given 11/22/14 2344)  0.9 %  sodium chloride infusion ( Intravenous New Bag/Given 11/22/14 2346)  0.9 %  sodium chloride infusion ( Intravenous Stopped 11/22/14 1857)  sodium chloride 0.9 % bolus 1,000 mL (1,000 mLs Intravenous Transfusing/Transfer 11/22/14 1927)  fosfomycin (MONUROL) packet 3 g (3 g Oral Given 11/22/14 2344)  sulfamethoxazole-trimethoprim (BACTRIM DS,SEPTRA DS) 800-160 MG per tablet 1 tablet (1 tablet Oral Given 11/22/14 1907)    Filed Vitals:    11/22/14 2000 11/22/14 2015 11/22/14 2030 11/22/14 2130  BP: 167/129 137/60 125/64 137/51  Pulse: 96 83 72 89  Temp:    99.2 F (37.3 C)  TempSrc:    Oral  Resp: 33  32 34 18  Height:    5\' 5"  (1.651 m)  Weight:    188 lb 7.9 oz (85.5 kg)  SpO2: 97% 97% 97% 90%    Final diagnoses:  UTI (lower urinary tract infection)    Admission/ observation were discussed with the admitting physician, patient and/or family and they are comfortable with the plan.    Deno Etienne, DO 11/23/14 5750

## 2014-11-22 NOTE — H&P (Signed)
History and Physical  Kirsten Flores  UUE:[id number]  DOB: Apr 01, 1923  DOA: 11/22/2014  Referring physician: Deno Etienne, MD PCP: Garnet Koyanagi, DO   Chief Complaint: "She's been weak and we think she has a urinary tract infection again."  HPI: Kirsten Flores is a 81 y.o. female with a past medical history significant for dementia, chronic COPD on 4 L home oxygen, chronic diastolic heart failure, squamous cancer of the skin electing not to treat, hypertension, and hypothyroidism who presents with 3 days weakness.  The patient is accompanied by her son who provides most history. He reports that she has had increasing weakness over the last 3 days, accompanied by urinary incontinence and more confusion. She has not had increased respiratory difficulty, cough, or purulent sputum. She has not had fever. She does endorse sharp pain before initiating urine.  The patient was admitted in May for urinary tract infection pan susceptible Escherichia coli. As then readmitted twice in May and June for acute on chronic respiratory failure with hypercapnia.  In the ED, the patient was not tachycardic or tachypneic and her white count was normal. Lactic acid was normal. Of note the patient had a urine culture by [facility] at the beginning of August was an ESBL, and was treated with Bactrim.   Review of Systems:  Patient seen 6:53 PM on 11/22/2014. Pt complains of increased weakness, smelling of urine, dysuria. Pt denies any fever, chills, respiratory symptoms.  Twelve systems were reviewed and were negative except as noted above in the history of present illness.  Past Medical History  Diagnosis Date  . Hyperlipidemia   . Hypertension   . Osteopenia   . Thyroid disease   . B12 deficiency   . Arthritis   . Memory loss   . Knee pain     RIGHT  . Gait disturbance   . COPD (chronic obstructive pulmonary disease)   . Cancer     Squamous cell skin cancer, no treatment   The above past medical  history was reviewed. Past Surgical History  Procedure Laterality Date  . Appendectomy    . Cholecystectomy    . Abdominal hysterectomy    . Oophorectomy     The above surgical history was reviewed.  Social History:  reports that she quit smoking about 43 years ago. Her smoking use included Cigarettes. She has a 50 pack-year smoking history. She has never used smokeless tobacco. She reports that she drinks alcohol. She reports that she does not use illicit drugs. Patient lives with her son. She is a former smoker. She is dependent for all ADLs.  Allergies  Allergen Reactions  . Diltiazem Hcl Other (See Comments)    Unknown reaction  . Neomycin Other (See Comments)    Unknown reaction (a long time ago)    Family History  Problem Relation Age of Onset  . Colon cancer    . Melanoma    . Cancer Other     COLON...1ST DEGREE RELATIVE     Prior to Admission medications   Medication Sig Start Date End Date Taking? Authorizing Provider  atorvastatin (LIPITOR) 10 MG tablet Take 1 tablet (10 mg total) by mouth daily. 05/28/14  Yes Yvonne R Lowne, DO  betaxolol (BETOPTIC-S) 0.25 % ophthalmic suspension Place 1 drop into both eyes 2 (two) times daily.     Yes Historical Provider, MD  Cholecalciferol (VITAMIN D PO) Take 1 tablet by mouth daily.   Yes Historical Provider, MD  clopidogrel (PLAVIX) 75  MG tablet TAKE 1 BY MOUTH DAILY WITH BREAKFAST Patient taking differently: TAKE 75 MG BY MOUTH DAILY WITH BREAKFAST 10/10/14  Yes Yvonne R Lowne, DO  dextromethorphan-guaiFENesin (MUCINEX DM) 30-600 MG per 12 hr tablet Take 1 tablet by mouth 2 (two) times daily. 08/26/14  Yes Geradine Girt, DO  fenofibrate (TRICOR) 145 MG tablet TAKE 1 BY MOUTH DAILY Patient taking differently: TAKE 145 MG BY MOUTH DAILY 06/25/14  Yes Yvonne R Lowne, DO  gabapentin (NEURONTIN) 600 MG tablet Take 1 tablet (600 mg total) by mouth at bedtime. 10/24/13  Yes Yvonne R Lowne, DO  Ibuprofen-Diphenhydramine HCl (ADVIL PM)  200-25 MG CAPS Take 2 tablets by mouth at bedtime as needed (sleep).   Yes Historical Provider, MD  levothyroxine (SYNTHROID, LEVOTHROID) 125 MCG tablet Take 125 mcg by mouth daily before breakfast.   Yes Historical Provider, MD  Misc. Devices [facility]) MISC 24 inch wheel chair 09/25/14  Yes Rosalita Chessman, DO  Multiple Vitamin (MULTIVITAMIN WITH MINERALS) TABS tablet Take 1 tablet by mouth daily.   Yes Historical Provider, MD  pantoprazole (PROTONIX) 40 MG tablet TAKE 1 BY MOUTH DAILY Patient taking differently: TAKE 40 MG BY MOUTH DAILY 11/13/14  Yes Alferd Apa Lowne, DO  potassium chloride SA (K-DUR,KLOR-CON) 20 MEQ tablet Take 1 tablet (20 mEq total) by mouth daily. 10/10/14  Yes Yvonne R Lowne, DO  sertraline (ZOLOFT) 50 MG tablet Take 1 tablet (50 mg total) by mouth daily. 10/24/13  Yes Rosalita Chessman, DO    Physical Exam: BP 152/57 mmHg  Pulse 186  Temp(Src) 98.6 F (37 C) (Oral)  Resp 32  SpO2 92% General: Adult female, alert and in no obvious distress.  Responds to questions.   HEENT: Head normal, other than skin see below.  Eyes normal .  PERRL and EOMI.  Nose normal.  OP moist without erythema, exudates, cobblestoning, or ulcers.  No airway deformities.  Neck supple. No thyromegaly.  Skin: Warm and dry.  Decreased skin turgor. No jaundice. There are numerous scabbed lesions on the patient's legs arms and face. On the forehead there is a very large keratotic plaque. Cardiac: RRR, nl M0-N0, 2/6 systolic ejection murmur appreciated.  Capillary refill is less than 2 seconds.  JVP not visible. Mild lower extremity edema, 1+ pitting bilaterally. Respiratory: Normal respiratory rate and rhythm.  Coarse breath sounds bilaterally.  No wheezes.   Abdomen: BS present.  Soft. Mild tenderness to palpation suprapubically. No masses or organomegaly.  No striae, dilated veins, rashes, or lesions.  No ascites, distension. Neuro: Oriented to person place and time. Sensorium intact.  Cranial nerves 3-12  intact.  Speech is fluent.  The patient's recall seems slightly impaired.  Moves all extremities equally and with normal coordination.    Psych: Appropriate affect.  Normal rate and rhythm of speech.  Thought content appropriate, and thought process linear.  No evidence of aural or visual hallucinations or delusions. Attention and concentration are normal.           Labs on Admission:  The metabolic panel is notable for elevated creatinine from a baseline of 0.6 mg/dL. Normal potassium. Chronically elevated bicarbonate. Slight hypernatremia, within the limits of normal. The complete blood count is notable for normal white blood cell count chronic macrocytic anemia. The lactic acid is normal. The AST ALT and bilirubin are normal.  The urinalysis shows copious white blood cells and bacteria. Culture pending.   Radiological Exams on Admission: Personally reviewed: Dg Chest 2 View 11/22/2014  IMPRESSION: 1. Cardiomegaly and mild pulmonary vascular congestion. 2. Small amount of right pleural fluid or thickening. 3. Mild chronic interstitial lung disease and chronic bronchitic changes. 4. Small amount of atelectasis or scarring at both lung bases.     EKG: Independently reviewed. NSR.  Assessment/Plan Present on Admission:  . AKI (acute kidney injury) . Hypothyroidism . Essential hypertension . Memory loss . UTI (lower urinary tract infection) . Diastolic dysfunction with chronic heart failure . Chronic diastolic CHF (congestive heart failure) . COPD, severe   1. Acute kidney injury:  The patient's baseline creatinine is 0.6 mg/dL. This is likely a prerenal injury and she is getting fluid resuscitation. -Repeat BMP tomorrow -Urine electrolytes -Avoid nephrotoxins -Normal saline at 100 mL per hour to be discontinued tomorrow.   2. Urinary tract infection: The patient was initially started on ceftriaxone but this was stopped because of her history of recent ESBL. She has no  signs of systemic inflammation, my preference from the standpoint of antibiotic stewardship is to treat this with orals. -Fosfomycin 3 g once -Follow urine culture   3. COPD: Stable -Continue supplemental oxygen as at home -Spot pulse ox checks   4. Chronic medical illnesses: Continue Benadryl, statin, and fenofibrate for secondary stroke prevention. Continue sertraline for depression. Continue levothyroxine for hypothyroidism. Continue gabapentin. Continue pantoprazole, for GERD.  DVT PPx: Lovenox  Diet: Cardiac  Code Status: DO NOT RESUSCITATE  Family Communication: Son present at the bedside   Disposition Plan:  The appropriate admission status for this patient is INPATIENT. Inpatient status is judged to be reasonable and necessary in order to provide the required intensity of service to ensure the patient's safety. The patient's presenting symptoms, physical exam findings, and initial radiographic and laboratory data in the context of their chronic comorbidities is felt to place them at high risk for further clinical deterioration. Furthermore, it is not anticipated that the patient will be medically stable for discharge from the hospital within 2 midnights of admission. The following factors support the admission status of inpatient.   A. The patient's presenting symptoms include weakness, dysuria and new incontinence. B. The worrisome physical exam findings include weakness C. The initial radiographic and laboratory data are worrisome because of elevated creatinine, consistent with acute kidney injury. Urinalysis suggestive of UTI. D. The chronic co-morbidities include history of stroke, oxygen dependent COPD, chronic diastolic heart failure, hypertension. E. Patient requires inpatient status due to high intensity of service, high risk for further deterioration and high frequency of surveillance required. F. I certify that at the point of admission it is my clinical judgment  that the patient will require inpatient hospital care spanning beyond 2 midnights from the point of admission.     Edwin Dada [facility] Pager [phone]

## 2014-11-22 NOTE — ED Notes (Signed)
Bed: SW96 Expected date:  Expected time:  Means of arrival:  Comments: EMS 68yr Weakness Fever UTI

## 2014-11-22 NOTE — ED Notes (Signed)
Pt alert and oriented x4. Respirations even and unlabored, bilateral symmetrical rise and fall of chest. Skin warm and dry. In no acute distress. Denies needs.   

## 2014-11-22 NOTE — Telephone Encounter (Signed)
Noted.  Forwarded to Dr. Etter Sjogren for [place].

## 2014-11-22 NOTE — Telephone Encounter (Signed)
[facility] - Client [facility]  Patient Name: Kirsten Flores  DOB: 05/28/1923    Initial Comment Caller states her mother is having extreme weakness and drowsy today, also has a twitch in her right arm.    Nurse Assessment  Nurse: Justine Null, RN, Rodena Piety Date/Time (Eastern Time): 11/22/2014 3:30:45 PM  Confirm and document reason for call. If symptomatic, describe symptoms. ---Caller states her mother is having extreme weakness and drowsy today, also has a twitch in her right arm. caller stated that she has had no weakness and has been occurring when she sleeps and has some twitchiness of the right arm and has been able to use the arm like normal at present and has been extremely weak and drowsy today  Has the patient traveled out of the country within the last 30 days? ---No  Does the patient require triage? ---Yes  Related visit to physician within the last 2 weeks? ---No  Does the PT have any chronic conditions? (i.e. diabetes, asthma, etc.) ---Yes  List chronic conditions. ---COPD CHF CVA mild     Guidelines    Guideline Title Affirmed Question Affirmed Notes  Weakness (Generalized) and Fatigue [1] SEVERE weakness (i.e., unable to walk or barely able to walk, requires support) AND [2] new onset or worsening    Final Disposition User   Call EMS 911 Now Justine Null, RN, Rodena Piety    Disagree/Comply: Comply

## 2014-11-22 NOTE — ED Notes (Signed)
Called to give report to nurse. Secretary transferred [place] to the Nurse. Nurse did not answer. Secretary stated nurse will call back.

## 2014-11-22 NOTE — ED Notes (Signed)
Called to give report to nurse. Secretary stated nurse will call back. 

## 2014-11-22 NOTE — ED Notes (Signed)
hospitalist requesting rocefin be held at this time. May start a different abx.

## 2014-11-22 NOTE — Telephone Encounter (Signed)
[name] called stating pt having weakness, lethargic. Pt has barely sat up in bed today. Can't get her up and out of bed. Transferred to [facility] per Maudie Mercury and pts past medical history.

## 2014-11-22 NOTE — ED Notes (Signed)
Attempt to call report to the floor x 2.

## 2014-11-22 NOTE — ED Notes (Signed)
Delay in lab draw, case manager in with pt

## 2014-11-23 DIAGNOSIS — E039 Hypothyroidism, unspecified: Secondary | ICD-10-CM

## 2014-11-23 DIAGNOSIS — J449 Chronic obstructive pulmonary disease, unspecified: Secondary | ICD-10-CM | POA: Diagnosis not present

## 2014-11-23 DIAGNOSIS — N390 Urinary tract infection, site not specified: Secondary | ICD-10-CM | POA: Diagnosis not present

## 2014-11-23 DIAGNOSIS — I10 Essential (primary) hypertension: Secondary | ICD-10-CM

## 2014-11-23 DIAGNOSIS — I5032 Chronic diastolic (congestive) heart failure: Secondary | ICD-10-CM | POA: Diagnosis not present

## 2014-11-23 DIAGNOSIS — N179 Acute kidney failure, unspecified: Secondary | ICD-10-CM | POA: Diagnosis not present

## 2014-11-23 DIAGNOSIS — J9621 Acute and chronic respiratory failure with hypoxia: Secondary | ICD-10-CM | POA: Insufficient documentation

## 2014-11-23 LAB — BASIC METABOLIC PANEL
Anion gap: 6 (ref 5–15)
BUN: 18 mg/dL (ref 6–20)
CHLORIDE: 110 mmol/L (ref 101–111)
CO2: 30 mmol/L (ref 22–32)
Calcium: 8.5 mg/dL — ABNORMAL LOW (ref 8.9–10.3)
Creatinine, Ser: 0.95 mg/dL (ref 0.44–1.00)
GFR calc non Af Amer: 52 mL/min — ABNORMAL LOW (ref >60)
GFR, EST AFRICAN AMERICAN: 60 mL/min — AB (ref >60)
Glucose, Bld: 96 mg/dL (ref 65–99)
POTASSIUM: 3.8 mmol/L (ref 3.5–5.1)
SODIUM: 146 mmol/L — AB (ref 135–145)

## 2014-11-23 NOTE — Telephone Encounter (Signed)
Patient admitted to [facility] for UTI.

## 2014-11-23 NOTE — Progress Notes (Signed)
Initial Nutrition Assessment  DOCUMENTATION CODES:   Not applicable  INTERVENTION:  None at this time   NUTRITION DIAGNOSIS:   Predicted suboptimal nutrient intake related to cancer and cancer related treatments, poor appetite as evidenced by estimated needs.    GOAL:   Patient will meet greater than or equal to 90% of their needs    MONITOR:   PO intake, I & O's, Skin, Labs  REASON FOR ASSESSMENT:   Malnutrition Screening Tool    ASSESSMENT:   81 yo female with PMH of dementia, COPD on 4L Home Oxygen, CHF, Squamous cancer of skin electing to not treat. Pt presents with generalized weakness and belief of having a UTI. Pt struggles to recollect history, presents no wt loss,  or signs of malnutrition. Moderate edema related to CHF & COPD. Pt declined any desire for supplements at this time.    Diet Order:  Diet Heart Room service appropriate?: Yes; Fluid consistency:: Thin  Skin:  Reviewed, no issues  Last BM:  9/23  Height:   Ht Readings from Last 1 Encounters:  11/22/14 5\' 5"  (1.651 m)    Weight:   Wt Readings from Last 1 Encounters:  11/22/14 188 lb 7.9 oz (85.5 kg)    Ideal Body Weight:  56.81 kg  BMI:  Body mass index is 31.37 kg/(m^2).  Estimated Nutritional Needs:   Kcal:  1500-1700  Protein:  80-90 grams  Fluid:  Per doctor recommendation.  EDUCATION NEEDS:   No education needs identified at this time  Kirsten Flores. Kirsten Reser, MS, RD LDN After Hours/Weekend Pager [phone]

## 2014-11-24 DIAGNOSIS — J449 Chronic obstructive pulmonary disease, unspecified: Secondary | ICD-10-CM | POA: Diagnosis not present

## 2014-11-24 DIAGNOSIS — I5032 Chronic diastolic (congestive) heart failure: Secondary | ICD-10-CM | POA: Diagnosis not present

## 2014-11-24 DIAGNOSIS — J9611 Chronic respiratory failure with hypoxia: Secondary | ICD-10-CM | POA: Insufficient documentation

## 2014-11-24 DIAGNOSIS — N390 Urinary tract infection, site not specified: Secondary | ICD-10-CM | POA: Diagnosis not present

## 2014-11-24 DIAGNOSIS — N179 Acute kidney failure, unspecified: Secondary | ICD-10-CM | POA: Diagnosis not present

## 2014-11-24 NOTE — Progress Notes (Signed)
[facility] PROGRESS NOTE  Kirsten Flores FGH:[id number] DOB: 1923-08-15 DOA: 11/22/2014 PCP: Garnet Koyanagi, DO  Assessment/Plan: 1-AKI: due to decrease PO intake and UTI -after IVF's and abx's Cr basically back to baseline -will follow trend and minimize nephrotoxic agents  2-E. Coli UTI  -apparently ESBL for urine culture by [facility] home [facility] -was bactrim prior to admission -will continue supportive care -received in dose of fosfomycin   3-chronic resp failure: due to COPD -stable -continue home nebulizer therapy  4-chronic diastolic heart failure: compensated overall and no complaining of SOB or leg swelling -will follow daily weights and I's and O's  5-depression: continue sertraline  6-hypothyroidism: will continue synthroid  7-GERD: will continue PPI  8-HLD: will continue statins    Code Status: DNR Family Communication: son by phone  Disposition Plan: to be determine, patient is deconditioned and unclear if she counts with hode   Consultants:  None   Procedures:  See below for x-ray reports   Antibiotics:  Was on bactrim prior to admission  One dose of fosfomycin   HPI/Subjective: AAOX2, no fever, denies CP, SOB and dysuria.  Objective: Filed Vitals:   11/23/14 2140  BP: 98/58  Pulse: 63  Temp: 97.9 F (36.6 C)  Resp: 24    Intake/Output Summary (Last 24 hours) at 11/24/14 0000 Last data filed at 11/23/14 2141  Gross per 24 hour  Intake    690 ml  Output      0 ml  Net    690 ml   Filed Weights   11/22/14 2130  Weight: 85.5 kg (188 lb 7.9 oz)    Exam:   General:  Afebrile, no CP, no SOB. Patient denies dysuria. Report feeling generalized wekaness  Cardiovascular: S1 and S2, no rubs or gallops; positive SEM  Respiratory: scattered rhonchi, no wheezing  Abdomen: sofe, NT, ND, positive BS  Musculoskeletal: no edema or cyanosis   Data Reviewed: Basic Metabolic Panel:  Recent Labs Lab 11/22/14 1718 11/23/14 0413   NA 144 146*  K 4.2 3.8  CL 105 110  CO2 34* 30  GLUCOSE 115* 96  BUN 19 18  CREATININE 1.13* 0.95  CALCIUM 8.9 8.5*   Liver Function Tests:  Recent Labs Lab 11/22/14 1718  AST 49*  ALT 21  ALKPHOS 48  BILITOT 0.9  PROT 5.7*  ALBUMIN 2.7*   CBC:  Recent Labs Lab 11/22/14 1718  WBC 8.4  NEUTROABS 6.4  HGB 8.8*  HCT 29.2*  MCV 105.0*  PLT 161   BNP (last 3 results)  Recent Labs  07/11/14 1525 07/22/14 1810 08/13/14 2007  BNP 282.5* 715.8* 454.8*    Recent Results (from the past 240 hour(s))  Urine culture     Status: None (Preliminary result)   Collection Time: 11/22/14  5:12 PM  Result Value Ref Range Status   Specimen Description URINE, CATHETERIZED  Final   Special Requests NONE  Final   Culture   Final    >=100,000 COLONIES/mL ESCHERICHIA COLI Performed at [facility]    Report Status PENDING  Incomplete     Studies: Dg Chest 2 View  11/22/2014   CLINICAL DATA:  Increased weakness for the past 2 days.  Ex-smoker.  EXAM: CHEST  2 VIEW  COMPARISON:  08/22/2014.  FINDINGS: Stable enlarged cardiac silhouette. Prominent pulmonary vasculature. Small amount of right pleural thickening or fluid. Minimal bibasilar atelectasis or scarring. Mildly prominent interstitial markings with improvement. Diffuse peribronchial thickening. Diffuse osteopenia.  IMPRESSION: 1. Cardiomegaly  and mild pulmonary vascular congestion. 2. Small amount of right pleural fluid or thickening. 3. Mild chronic interstitial lung disease and chronic bronchitic changes. 4. Small amount of atelectasis or scarring at both lung bases.   Electronically Signed   By: Claudie Revering M.D.   On: 11/22/2014 18:36    Scheduled Meds: . atorvastatin  10 mg Oral Daily  . betaxolol  1 drop Both Eyes BID  . clopidogrel  75 mg Oral Daily  . enoxaparin (LOVENOX) injection  40 mg Subcutaneous QHS  . fenofibrate  160 mg Oral Daily  . gabapentin  600 mg Oral QHS  . levothyroxine  125 mcg Oral QAC  breakfast  . pantoprazole  40 mg Oral Daily  . sertraline  50 mg Oral Daily   Continuous Infusions: . sodium chloride 50 mL/hr at 11/23/14 2255    Principal Problem:   AKI (acute kidney injury) Active Problems:   Hypothyroidism   Essential hypertension   Memory loss   UTI (lower urinary tract infection)   Diastolic dysfunction with chronic heart failure   Chronic diastolic CHF (congestive heart failure)   COPD, severe   Acute on chronic respiratory failure with hypoxia    Time spent: 30 minutes    Barton Dubois  [facility] Pager [phone]. If 7PM-7AM, please contact night-coverage at [email or web address], password [facility] 11/24/2014, 12:00 AM

## 2014-11-24 NOTE — Evaluation (Signed)
Physical Therapy Evaluation Patient Details Name: Kirsten Flores MRN: [id number] DOB: Sep 06, 1923 Today's Date: 11/24/2014   History of Present Illness  81 y.o. female with a past medical history significant for dementia, chronic COPD on 4 L home oxygen, chronic diastolic heart failure, squamous cancer of the skin electing not to treat, hypertension, and hypothyroidism and admitted for acute kidney injury due to decrease PO intake and UTI  Clinical Impression  Pt admitted with above diagnosis. Pt currently with functional limitations due to the deficits listed below (see PT Problem List).  Pt will benefit from skilled PT to increase their independence and safety with mobility to allow discharge to the venue listed below.   Pt requiring increased assist for bed mobility and refusing to stand.  Pt reports son and PT can assist her at home.  Pt very tired/sleepy and mostly kept eyes closed.  Pt may have improved function at different time of day and/or in home environment.  If son feels unable to assist at home, pt may need SNF.     Follow Up Recommendations Home health PT;Supervision/Assistance - 24 hour    Equipment Recommendations  None recommended by PT    Recommendations for Other Services       Precautions / Restrictions Precautions Precautions: Fall Precaution Comments: chronic O2      Mobility  Bed Mobility Overal bed mobility: Needs Assistance Bed Mobility: Supine to Sit;Sit to Supine     Supine to sit: Total assist Sit to supine: Total assist   General bed mobility comments: pt only slightly assisting however mostly required external assist to perform, multimodal cues provided as pt sleepy/tired, remained on O2 [place], denies SOB and dizziness  Transfers Overall transfer level:  (pt refused)                  Ambulation/Gait                Stairs            Wheelchair Mobility    Modified Rankin (Stroke Patients Only)       Balance Overall  balance assessment: Needs assistance Sitting-balance support: Bilateral upper extremity supported;Feet supported Sitting balance-Leahy Scale: Fair                                       Pertinent Vitals/Pain Pain Assessment: No/denies pain    Home Living Family/patient expects to be discharged to:: Private residence Living Arrangements: Children (son) Available Help at Discharge: Family Type of Home: House Home Access: Ramped entrance     Home Layout: One level Home Equipment: Environmental consultant - 2 wheels;Tub bench;Bedside commode;Hospital bed;Electric scooter;Wheelchair - manual Additional Comments: information per previous admission and CM note, pt poor historian at this time    Prior Function Level of Independence: Needs assistance   Gait / Transfers Assistance Needed: reports using RW for ambulation  ADL's / Homemaking Assistance Needed: caregiver assists        Hand Dominance        Extremity/Trunk Assessment   Upper Extremity Assessment: Generalized weakness           Lower Extremity Assessment: Generalized weakness         Communication   Communication: HOH  Cognition Arousal/Alertness:  (sleepy/tired/eyes closed most of time) Behavior During Therapy: WFL for tasks assessed/performed Overall Cognitive Status: No family/caregiver present to determine baseline cognitive functioning  General Comments: pt appears tired, able to open eyes and answer questions however limited responses and closes eyes again    General Comments      Exercises        Assessment/Plan    PT Assessment Patient needs continued PT services  PT Diagnosis Difficulty walking;Generalized weakness   PT Problem List Decreased strength;Decreased mobility;Decreased activity tolerance  PT Treatment Interventions DME instruction;Gait training;Functional mobility training;Patient/family education;Therapeutic exercise;Therapeutic activities;Balance  training;Wheelchair mobility training   PT Goals (Current goals can be found in the Care Plan section) Acute Rehab PT Goals PT Goal Formulation: With patient Time For Goal Achievement: 12/08/14 Potential to Achieve Goals: Fair    Frequency Min 3X/week   Barriers to discharge        Co-evaluation               End of Session Equipment Utilized During Treatment: Oxygen Activity Tolerance: Patient limited by fatigue Patient left: in bed;with call bell/phone within reach;with bed alarm set      Functional Assessment Tool Used: clinical judgement Functional Limitation: Mobility: Walking and moving around Mobility: Walking and Moving Around Current Status (Q7619): At least 80 percent but less than 100 percent impaired, limited or restricted Mobility: Walking and Moving Around Goal Status [phone]): At least 40 percent but less than 60 percent impaired, limited or restricted    Time: 1003-1016 PT Time Calculation (min) (ACUTE ONLY): 13 min   Charges:   PT Evaluation $Initial PT Evaluation Tier I: 1 Procedure     PT G Codes:   PT G-Codes **NOT FOR INPATIENT CLASS** Functional Assessment Tool Used: clinical judgement Functional Limitation: Mobility: Walking and moving around Mobility: Walking and Moving Around Current Status (I7124): At least 80 percent but less than 100 percent impaired, limited or restricted Mobility: Walking and Moving Around Goal Status [phone]): At least 40 percent but less than 60 percent impaired, limited or restricted    LEMYRE,KATHrine E 11/24/2014, 10:35 AM Carmelia Bake, PT, DPT 11/24/2014 Pager: [phone]

## 2014-11-24 NOTE — Care Management Note (Addendum)
Case Management Note  Patient Details  Name: ROCKELLE HEUERMAN MRN: [id number] Date of Birth: April 11, 1923  Subjective/Objective:                    Action/Plan: Discharge planning  Expected Discharge Date:     11/24/14             Expected Discharge Plan:     In-House Referral:     Discharge planning Services     Post Acute Care Choice:    Choice offered to:     DME Arranged:    DME Agency:     HH Arranged:   PT, OT, HHA, Social work [employer] Agency:   [place]  Status of Service:     Medicare Important Message Given:    Date Medicare IM Given:    Medicare IM give by:    Date Additional Medicare IM Given:    Additional Medicare Important Message give by:     If discussed at [place] of Stay Meetings, dates discussed:    Additional Comments: CM received a call from patient's RN. Reports per the patient's son, she had [place] with [facility] prior to being admitted which included a HHA daily. MD is entering a resumption of care order per nurse. Son is requesting the HHA visit this evening. CM spoke with Colletta [place] at [facility] who states the patient was discharged 10 days ago. She was not receiving HHA's thru [facility], has a paid HHA separately. [facility] will need a new home care order with face to face to start services. Paged Dr. Dyann Kief requesting the new order for services.  Apolonio Schneiders, RN 11/24/2014, 4:33 PM

## 2014-11-24 NOTE — Discharge Summary (Signed)
Physician Discharge Summary  Kirsten Flores TDD:[id number] DOB: [birth year] DOA: 11/22/2014  PCP: Garnet Koyanagi, DO  Admit date: 11/22/2014 Discharge date: 11/24/2014  Time spent: 35 minutes  Recommendations for Outpatient Follow-up:  1. Take medications as prescribed 2. Arrange follow up withPCP in 10 days 3. Keep yourself wel hydrated   Discharge Diagnoses:  Principal Problem:   AKI (acute kidney injury) Active Problems:   Hypothyroidism   Essential hypertension   Memory loss   UTI (lower urinary tract infection)   Diastolic dysfunction with chronic heart failure   Chronic diastolic CHF (congestive heart failure)   COPD, severe   Acute on chronic respiratory failure with hypoxia   Discharge Condition: stable and improved. Discharge home with sone care and [facility] services. Follow with PCP in 10 days and follow instructions for [facility] servie  Diet recommendation: low sodium diet  Filed Weights   11/22/14 2130  Weight: 85.5 kg (188 lb 7.9 oz)    History of present illness:  81 y.o. female with a past medical history significant for dementia, chronic COPD on 4 L home oxygen, chronic diastolic heart failure, squamous cancer of the skin electing not to treat, hypertension, and hypothyroidism who presents with 3 days weakness.81 y.o. female with a past medical history significant for dementia, chronic COPD on 4 L home oxygen, chronic diastolic heart failure, squamous cancer of the skin electing not to treat, hypertension, and hypothyroidism who presents with 3 days weakness. In the ED, the patient was not tachycardic or tachypneic and her white count was normal. Lactic acid was normal  Hospital Course:  1-AKI: due to decrease PO intake and UTI; pre-real in nature  -after IVF's and abx's Cr basically back to baseline -will follow trend as an outpatient -patient advise to maintain herself well hydrated  2-E. Coli UTI  -apparently ESBL for most recent urine culture by advance home  care -was treated with bactrim at that time -will continue supportive care -received one dose of fosfomycin  -no dysuria or signs of infection currently  3-chronic resp failure: due to COPD -stable -continue home nebulizer therapy  4-chronic diastolic heart failure: compensated overall and no complaining of SOB or leg swelling -will encourage daily weights checks -low sodium diet has been encouraged  5-depression: continue sertraline -no SI or hallucinations  6-hypothyroidism: will continue synthroid  7-GERD: will continue PPI  8-HLD: will continue statins  Procedures:  See below for x-ray reports   Consultations:  None   Discharge Exam: Filed Vitals:   11/24/14 1350  BP: 108/59  Pulse: 60  Temp: 98 F (36.7 C)  Resp: 20    General: Afebrile, no CP, no SOB. Patient denies dysuria. Reports she is weak, but endorses that son can take care of her and assist her.   Cardiovascular: S1 and S2, no rubs or gallops; positive SEM  Respiratory: scattered rhonchi, no wheezing  Abdomen: sofe, NT, ND, positive BS  Musculoskeletal: no edema or cyanosis   Discharge Instructions   Discharge Instructions    Diet - low sodium heart healthy    Complete by:  As directed      Discharge instructions    Complete by:  As directed   Arranged follow up with PCP in 10 days Take medications as prescribed Please keep patient well hydrated and with good PO intake for nutrition  Follow recommendations from [facility] services staff          Current Discharge Medication List    CONTINUE these medications which  have NOT CHANGED   Details  atorvastatin (LIPITOR) 10 MG tablet Take 1 tablet (10 mg total) by mouth daily. Qty: 90 tablet, Refills: 1    betaxolol (BETOPTIC-S) 0.25 % ophthalmic suspension Place 1 drop into both eyes 2 (two) times daily.      Cholecalciferol (VITAMIN D PO) Take 1 tablet by mouth daily.    clopidogrel (PLAVIX) 75 MG tablet TAKE 1 BY MOUTH DAILY WITH  BREAKFAST Qty: 90 tablet, Refills: 0    dextromethorphan-guaiFENesin (MUCINEX DM) 30-600 MG per 12 hr tablet Take 1 tablet by mouth 2 (two) times daily.    fenofibrate (TRICOR) 145 MG tablet TAKE 1 BY MOUTH DAILY Qty: 90 tablet, Refills: 3    gabapentin (NEURONTIN) 600 MG tablet Take 1 tablet (600 mg total) by mouth at bedtime. Qty: 90 tablet, Refills: 3   Associated Diagnoses: Pain    Ibuprofen-Diphenhydramine HCl (ADVIL PM) 200-25 MG CAPS Take 2 tablets by mouth at bedtime as needed (sleep).    levothyroxine (SYNTHROID, LEVOTHROID) 125 MCG tablet Take 125 mcg by mouth daily before breakfast.    Misc. Devices [facility]) MISC 24 inch wheel chair Qty: 1 each, Refills: 0    Multiple Vitamin (MULTIVITAMIN WITH MINERALS) TABS tablet Take 1 tablet by mouth daily.    pantoprazole (PROTONIX) 40 MG tablet TAKE 1 BY MOUTH DAILY Qty: 90 tablet, Refills: 1    potassium chloride SA (K-DUR,KLOR-CON) 20 MEQ tablet Take 1 tablet (20 mEq total) by mouth daily. Qty: 30 tablet, Refills: 5    sertraline (ZOLOFT) 50 MG tablet Take 1 tablet (50 mg total) by mouth daily. Qty: 90 tablet, Refills: 3   Associated Diagnoses: Depression       Allergies  Allergen Reactions  . Diltiazem Hcl Other (See Comments)    Unknown reaction  . Neomycin Other (See Comments)    Unknown reaction (a long time ago)   Follow-up Information    Follow up with [facility].   Why:  home health services   Contact information:   [address] [place] [address] [phone]       Follow up with Garnet Koyanagi, DO. Schedule an appointment as soon as possible for a visit in 10 days.   Specialty:  Family Medicine   Contact information:   [address] [place] [address] [phone]       The results of significant diagnostics from this hospitalization (including imaging, microbiology, ancillary and laboratory) are listed below for reference.    Significant  Diagnostic Studies: Dg Chest 2 View  11/22/2014   CLINICAL DATA:  Increased weakness for the past 2 days.  Ex-smoker.  EXAM: CHEST  2 VIEW  COMPARISON:  08/22/2014.  FINDINGS: Stable enlarged cardiac silhouette. Prominent pulmonary vasculature. Small amount of right pleural thickening or fluid. Minimal bibasilar atelectasis or scarring. Mildly prominent interstitial markings with improvement. Diffuse peribronchial thickening. Diffuse osteopenia.  IMPRESSION: 1. Cardiomegaly and mild pulmonary vascular congestion. 2. Small amount of right pleural fluid or thickening. 3. Mild chronic interstitial lung disease and chronic bronchitic changes. 4. Small amount of atelectasis or scarring at both lung bases.   Electronically Signed   By: Claudie Revering M.D.   On: 11/22/2014 18:36    Microbiology: Recent Results (from the past 240 hour(s))  Urine culture     Status: None (Preliminary result)   Collection Time: 11/22/14  5:12 PM  Result Value Ref Range Status   Specimen Description URINE, CATHETERIZED  Final  Special Requests NONE  Final   Culture   Final    >=100,000 COLONIES/mL ESCHERICHIA COLI CULTURE REINCUBATED FOR BETTER GROWTH Performed at [facility]    Report Status PENDING  Incomplete     Labs: Basic Metabolic Panel:  Recent Labs Lab 11/22/14 1718 11/23/14 0413  NA 144 146*  K 4.2 3.8  CL 105 110  CO2 34* 30  GLUCOSE 115* 96  BUN 19 18  CREATININE 1.13* 0.95  CALCIUM 8.9 8.5*   Liver Function Tests:  Recent Labs Lab 11/22/14 1718  AST 49*  ALT 21  ALKPHOS 48  BILITOT 0.9  PROT 5.7*  ALBUMIN 2.7*   CBC:  Recent Labs Lab 11/22/14 1718  WBC 8.4  NEUTROABS 6.4  HGB 8.8*  HCT 29.2*  MCV 105.0*  PLT 161   BNP (last 3 results)  Recent Labs  07/11/14 1525 07/22/14 1810 08/13/14 2007  BNP 282.5* 715.8* 454.8*    Signed:  Barton Dubois  [facility] 11/24/2014, 4:37 PM

## 2014-11-24 NOTE — Progress Notes (Signed)
Pt discharged home with son via [facility] in good condition.  Discharge instructions given to son. Son verbalized understanding.

## 2014-11-25 NOTE — Clinical Social Work Note (Signed)
CSW received a call from RN requesting ambulance transport home for pt  CSW met with pt at bedside to confirm address, prepared packet and let RN know that MD will need to sign DNR.  CSW left packet left with RN and called [facility] to schedule  4:30 pick up  .Dede Query, LCSW [facility] Clinical Social Worker - Weekend Coverage cell #: [phone]

## 2014-11-26 ENCOUNTER — Telehealth: Payer: Self-pay | Admitting: Family Medicine

## 2014-11-26 ENCOUNTER — Ambulatory Visit: Payer: Medicare Other

## 2014-11-26 LAB — URINE CULTURE

## 2014-11-26 NOTE — Telephone Encounter (Signed)
[name] calling stating pt was in hospital 3 days and they dc her Saturday. She had a UTI. [name] asking about the results from a couple weeks ago here from urinalysis. He is also wondering next steps as pt is still not feeling well and what to do. Please call at [phone].

## 2014-11-26 NOTE — Telephone Encounter (Signed)
Verbal order given pending faxed order.   

## 2014-11-26 NOTE — Telephone Encounter (Signed)
Kirsten Flores returned call, stated was at an appt and didn't answer . Would like to be call today ASAP. Thank you

## 2014-11-26 NOTE — Telephone Encounter (Signed)
Ok to give order? 

## 2014-11-26 NOTE — Telephone Encounter (Signed)
Could you please do a TCM call for this patient? Media shows last UTI results 10/29/2014 as neg.

## 2014-11-26 NOTE — Telephone Encounter (Signed)
Santiago Glad with [facility] Ph# [phone] Fax# [phone]  Please call with verbal order for OT. Or if faxing send to attn: [facility] Team.

## 2014-11-26 NOTE — Telephone Encounter (Signed)
Called patient. Phone disconnected. Tried calling back 2x.  Phone line was busy.  Will try again later.

## 2014-11-27 NOTE — Telephone Encounter (Deleted)
Kirsten Flores returning your call best #

## 2014-11-27 NOTE — Telephone Encounter (Signed)
i think its time for hospice--- please see if home health agrees

## 2014-11-27 NOTE — Telephone Encounter (Signed)
Left a message for call back.  

## 2014-11-27 NOTE — Telephone Encounter (Signed)
Son returned call.  Per discharge summary-pt was advised to follow up with PCP in 10 days.  Per son, pt is not capable of coming into office for hospital follow up at this time.  Per son, mom is not able to get up out of bed.  Says she's just been laying in bed breathing since she was discharged from hospital.  She very weak.  Requires the assistance of 3 ppl.  She has been running a low grade fever since last Thursday (99.5, typically runs low--97 or so per son).  It finally broke today.  Per son, pt is alert to self and him.  Sometimes she's oriented to place.  She answers most of the questions asked with "I don't know."   She's not eating.  She's nervous about eating.  She drinks protein shakes/smoothies and 2-3 glasses of water per day instead.  He says Home Health( Nursing/CNAs, PT and OT) has been monitoring her since discharge.  Her oxygen saturation ranges 95-100%.  HR: 80s. Last temp: 98.5.  Last BP:  110/70.    Please advise.

## 2014-11-27 NOTE — Telephone Encounter (Signed)
Noted.  Please see [facility] note.  

## 2014-11-27 NOTE — Telephone Encounter (Signed)
Left vm with Santiago Glad ok to enact OT with patient.

## 2014-11-28 ENCOUNTER — Emergency Department (HOSPITAL_COMMUNITY): Payer: Medicare Other

## 2014-11-28 ENCOUNTER — Telehealth: Payer: Self-pay | Admitting: Family Medicine

## 2014-11-28 ENCOUNTER — Encounter (HOSPITAL_COMMUNITY): Payer: Self-pay | Admitting: *Deleted

## 2014-11-28 ENCOUNTER — Inpatient Hospital Stay (HOSPITAL_COMMUNITY)
Admission: EM | Admit: 2014-11-28 | Discharge: 2014-12-03 | DRG: 070 | Disposition: A | Discharge status: Home Health | Payer: Medicare Other | Attending: Internal Medicine | Admitting: Internal Medicine

## 2014-11-28 DIAGNOSIS — Z7902 Long term (current) use of antithrombotics/antiplatelets: Secondary | ICD-10-CM

## 2014-11-28 DIAGNOSIS — I10 Essential (primary) hypertension: Secondary | ICD-10-CM

## 2014-11-28 DIAGNOSIS — E872 Acidosis: Secondary | ICD-10-CM | POA: Diagnosis present

## 2014-11-28 DIAGNOSIS — I130 Hypertensive heart and chronic kidney disease with heart failure and stage 1 through stage 4 chronic kidney disease, or unspecified chronic kidney disease: Secondary | ICD-10-CM | POA: Diagnosis present

## 2014-11-28 DIAGNOSIS — J90 Pleural effusion, not elsewhere classified: Secondary | ICD-10-CM | POA: Diagnosis present

## 2014-11-28 DIAGNOSIS — Z79899 Other long term (current) drug therapy: Secondary | ICD-10-CM | POA: Diagnosis not present

## 2014-11-28 DIAGNOSIS — N183 Chronic kidney disease, stage 3 unspecified: Secondary | ICD-10-CM | POA: Diagnosis present

## 2014-11-28 DIAGNOSIS — G9341 Metabolic encephalopathy: Secondary | ICD-10-CM | POA: Diagnosis present

## 2014-11-28 DIAGNOSIS — N390 Urinary tract infection, site not specified: Secondary | ICD-10-CM | POA: Diagnosis present

## 2014-11-28 DIAGNOSIS — F0390 Unspecified dementia without behavioral disturbance: Secondary | ICD-10-CM | POA: Diagnosis present

## 2014-11-28 DIAGNOSIS — D638 Anemia in other chronic diseases classified elsewhere: Secondary | ICD-10-CM | POA: Diagnosis present

## 2014-11-28 DIAGNOSIS — C4490 Unspecified malignant neoplasm of skin, unspecified: Secondary | ICD-10-CM | POA: Diagnosis present

## 2014-11-28 DIAGNOSIS — E039 Hypothyroidism, unspecified: Secondary | ICD-10-CM | POA: Diagnosis present

## 2014-11-28 DIAGNOSIS — B9620 Unspecified Escherichia coli [E. coli] as the cause of diseases classified elsewhere: Secondary | ICD-10-CM | POA: Diagnosis present

## 2014-11-28 DIAGNOSIS — E876 Hypokalemia: Secondary | ICD-10-CM | POA: Diagnosis not present

## 2014-11-28 DIAGNOSIS — J449 Chronic obstructive pulmonary disease, unspecified: Secondary | ICD-10-CM | POA: Diagnosis present

## 2014-11-28 DIAGNOSIS — J9622 Acute and chronic respiratory failure with hypercapnia: Secondary | ICD-10-CM | POA: Diagnosis present

## 2014-11-28 DIAGNOSIS — Z800 Family history of malignant neoplasm of digestive organs: Secondary | ICD-10-CM | POA: Diagnosis not present

## 2014-11-28 DIAGNOSIS — F329 Major depressive disorder, single episode, unspecified: Secondary | ICD-10-CM | POA: Diagnosis present

## 2014-11-28 DIAGNOSIS — I5032 Chronic diastolic (congestive) heart failure: Secondary | ICD-10-CM | POA: Diagnosis not present

## 2014-11-28 DIAGNOSIS — R4182 Altered mental status, unspecified: Secondary | ICD-10-CM | POA: Diagnosis present

## 2014-11-28 DIAGNOSIS — D631 Anemia in chronic kidney disease: Secondary | ICD-10-CM | POA: Diagnosis present

## 2014-11-28 DIAGNOSIS — I129 Hypertensive chronic kidney disease with stage 1 through stage 4 chronic kidney disease, or unspecified chronic kidney disease: Secondary | ICD-10-CM | POA: Diagnosis present

## 2014-11-28 DIAGNOSIS — Z9981 Dependence on supplemental oxygen: Secondary | ICD-10-CM | POA: Diagnosis not present

## 2014-11-28 DIAGNOSIS — Z87891 Personal history of nicotine dependence: Secondary | ICD-10-CM | POA: Diagnosis not present

## 2014-11-28 DIAGNOSIS — Z66 Do not resuscitate: Secondary | ICD-10-CM | POA: Diagnosis present

## 2014-11-28 DIAGNOSIS — J9621 Acute and chronic respiratory failure with hypoxia: Secondary | ICD-10-CM | POA: Diagnosis present

## 2014-11-28 DIAGNOSIS — Z9049 Acquired absence of other specified parts of digestive tract: Secondary | ICD-10-CM

## 2014-11-28 DIAGNOSIS — Z888 Allergy status to other drugs, medicaments and biological substances status: Secondary | ICD-10-CM | POA: Diagnosis not present

## 2014-11-28 DIAGNOSIS — J810 Acute pulmonary edema: Secondary | ICD-10-CM | POA: Diagnosis present

## 2014-11-28 DIAGNOSIS — Z515 Encounter for palliative care: Secondary | ICD-10-CM | POA: Insufficient documentation

## 2014-11-28 DIAGNOSIS — E538 Deficiency of other specified B group vitamins: Secondary | ICD-10-CM | POA: Diagnosis present

## 2014-11-28 DIAGNOSIS — E785 Hyperlipidemia, unspecified: Secondary | ICD-10-CM | POA: Diagnosis present

## 2014-11-28 DIAGNOSIS — F32A Depression, unspecified: Secondary | ICD-10-CM | POA: Diagnosis present

## 2014-11-28 DIAGNOSIS — G9340 Encephalopathy, unspecified: Secondary | ICD-10-CM | POA: Diagnosis not present

## 2014-11-28 DIAGNOSIS — E079 Disorder of thyroid, unspecified: Secondary | ICD-10-CM | POA: Diagnosis present

## 2014-11-28 DIAGNOSIS — Z85828 Personal history of other malignant neoplasm of skin: Secondary | ICD-10-CM

## 2014-11-28 DIAGNOSIS — J9602 Acute respiratory failure with hypercapnia: Secondary | ICD-10-CM

## 2014-11-28 DIAGNOSIS — R6510 Systemic inflammatory response syndrome (SIRS) of non-infectious origin without acute organ dysfunction: Secondary | ICD-10-CM | POA: Diagnosis present

## 2014-11-28 DIAGNOSIS — M1990 Unspecified osteoarthritis, unspecified site: Secondary | ICD-10-CM | POA: Diagnosis present

## 2014-11-28 DIAGNOSIS — E875 Hyperkalemia: Secondary | ICD-10-CM | POA: Diagnosis present

## 2014-11-28 DIAGNOSIS — M8580 Other specified disorders of bone density and structure, unspecified site: Secondary | ICD-10-CM | POA: Diagnosis present

## 2014-11-28 LAB — CBC WITH DIFFERENTIAL/PLATELET
Basophils Absolute: 0.1 10*3/uL (ref 0.0–0.1)
Basophils Relative: 1 %
EOS PCT: 1 %
Eosinophils Absolute: 0.1 10*3/uL (ref 0.0–0.7)
HEMATOCRIT: 31.5 % — AB (ref 36.0–46.0)
HEMOGLOBIN: 9.3 g/dL — AB (ref 12.0–15.0)
LYMPHS ABS: 1 10*3/uL (ref 0.7–4.0)
LYMPHS PCT: 16 %
MCH: 31.4 pg (ref 26.0–34.0)
MCHC: 29.5 g/dL — AB (ref 30.0–36.0)
MCV: 106.4 fL — AB (ref 78.0–100.0)
MONOS PCT: 9 %
Monocytes Absolute: 0.6 10*3/uL (ref 0.1–1.0)
NEUTROS ABS: 4.6 10*3/uL (ref 1.7–7.7)
Neutrophils Relative %: 73 %
Platelets: 221 10*3/uL (ref 150–400)
RBC: 2.96 MIL/uL — ABNORMAL LOW (ref 3.87–5.11)
RDW: 15.5 % (ref 11.5–15.5)
WBC: 6.4 10*3/uL (ref 4.0–10.5)

## 2014-11-28 LAB — URINALYSIS, ROUTINE W REFLEX MICROSCOPIC
BILIRUBIN URINE: NEGATIVE
Glucose, UA: NEGATIVE mg/dL
Ketones, ur: NEGATIVE mg/dL
NITRITE: NEGATIVE
PH: 5.5 (ref 5.0–8.0)
Protein, ur: NEGATIVE mg/dL
SPECIFIC GRAVITY, URINE: 1.016 (ref 1.005–1.030)
UROBILINOGEN UA: 1 mg/dL (ref 0.0–1.0)

## 2014-11-28 LAB — BLOOD GAS, ARTERIAL
ACID-BASE EXCESS: 8.8 mmol/L — AB (ref 0.0–2.0)
BICARBONATE: 37.7 meq/L — AB (ref 20.0–24.0)
DRAWN BY: 441261
O2 Content: 4 L/min
O2 SAT: 98.8 %
PATIENT TEMPERATURE: 98.6
PO2 ART: 146 mmHg — AB (ref 80.0–100.0)
TCO2: 36.4 mmol/L (ref 0–100)
pCO2 arterial: 87.8 mmHg (ref 35.0–45.0)
pH, Arterial: 7.256 — ABNORMAL LOW (ref 7.350–7.450)

## 2014-11-28 LAB — COMPREHENSIVE METABOLIC PANEL
ALBUMIN: 2.6 g/dL — AB (ref 3.5–5.0)
ALT: 23 U/L (ref 14–54)
ANION GAP: 5 (ref 5–15)
AST: 62 U/L — AB (ref 15–41)
Alkaline Phosphatase: 52 U/L (ref 38–126)
BILIRUBIN TOTAL: 1.3 mg/dL — AB (ref 0.3–1.2)
BUN: 13 mg/dL (ref 6–20)
CHLORIDE: 103 mmol/L (ref 101–111)
CO2: 37 mmol/L — AB (ref 22–32)
Calcium: 9.8 mg/dL (ref 8.9–10.3)
Creatinine, Ser: 0.84 mg/dL (ref 0.44–1.00)
GFR calc Af Amer: >60 mL/min (ref >60)
GFR calc non Af Amer: 60 mL/min — ABNORMAL LOW (ref >60)
GLUCOSE: 101 mg/dL — AB (ref 65–99)
POTASSIUM: 5.7 mmol/L — AB (ref 3.5–5.1)
SODIUM: 145 mmol/L (ref 135–145)
Total Protein: 5.4 g/dL — ABNORMAL LOW (ref 6.5–8.1)

## 2014-11-28 LAB — I-STAT CG4 LACTIC ACID, ED: LACTIC ACID, VENOUS: 0.42 mmol/L — AB (ref 0.5–2.0)

## 2014-11-28 LAB — BRAIN NATRIURETIC PEPTIDE: B NATRIURETIC PEPTIDE 5: 540.9 pg/mL — AB (ref 0.0–100.0)

## 2014-11-28 LAB — URINE MICROSCOPIC-ADD ON

## 2014-11-28 LAB — MRSA PCR SCREENING: MRSA BY PCR: NEGATIVE

## 2014-11-28 LAB — TROPONIN I

## 2014-11-28 MED ORDER — ATORVASTATIN CALCIUM 10 MG PO TABS
10.0000 mg | ORAL_TABLET | Freq: Every day | ORAL | Status: DC
  Administered 2014-11-28 – 2014-11-29 (×2): 10 mg via ORAL
  Filled 2014-11-28 (×2): qty 1

## 2014-11-28 MED ORDER — ACETAMINOPHEN 325 MG PO TABS: 650.0000 mg | ORAL_TABLET | Freq: Four times a day (QID) | ORAL | Status: DC | PRN

## 2014-11-28 MED ORDER — ADULT MULTIVITAMIN W/MINERALS CH
1.0000 | ORAL_TABLET | Freq: Every day | ORAL | Status: DC
  Administered 2014-11-28 – 2014-11-29 (×2): 1 via ORAL
  Filled 2014-11-28 (×2): qty 1

## 2014-11-28 MED ORDER — GABAPENTIN 600 MG PO TABS
600.0000 mg | ORAL_TABLET | Freq: Every day | ORAL | Status: DC
  Filled 2014-11-28: qty 1

## 2014-11-28 MED ORDER — FENOFIBRATE 160 MG PO TABS
160.0000 mg | ORAL_TABLET | Freq: Every day | ORAL | Status: DC
  Administered 2014-11-28 – 2014-11-29 (×2): 160 mg via ORAL
  Filled 2014-11-28 (×2): qty 1

## 2014-11-28 MED ORDER — SERTRALINE HCL 50 MG PO TABS
50.0000 mg | ORAL_TABLET | Freq: Every day | ORAL | Status: DC
  Administered 2014-11-28 – 2014-12-03 (×6): 50 mg via ORAL
  Filled 2014-11-28 (×6): qty 1

## 2014-11-28 MED ORDER — ONDANSETRON HCL 4 MG/2ML IJ SOLN: 4.0000 mg | Freq: Four times a day (QID) | INTRAMUSCULAR | Status: DC | PRN

## 2014-11-28 MED ORDER — SODIUM CHLORIDE 0.9 % IV SOLN
500.0000 mg | Freq: Three times a day (TID) | INTRAVENOUS | Status: DC
  Administered 2014-11-29 – 2014-12-01 (×8): 500 mg via INTRAVENOUS
  Filled 2014-11-28 (×10): qty 500

## 2014-11-28 MED ORDER — ACETAMINOPHEN 650 MG RE SUPP: 650.0000 mg | Freq: Four times a day (QID) | RECTAL | Status: DC | PRN

## 2014-11-28 MED ORDER — CLOPIDOGREL BISULFATE 75 MG PO TABS
75.0000 mg | ORAL_TABLET | Freq: Every day | ORAL | Status: DC
  Administered 2014-11-28 – 2014-12-03 (×6): 75 mg via ORAL
  Filled 2014-11-28 (×6): qty 1

## 2014-11-28 MED ORDER — FUROSEMIDE 10 MG/ML IJ SOLN
40.0000 mg | Freq: Once | INTRAMUSCULAR | Status: AC
  Administered 2014-11-28: 40 mg via INTRAVENOUS
  Filled 2014-11-28: qty 4

## 2014-11-28 MED ORDER — GABAPENTIN 300 MG PO CAPS
600.0000 mg | ORAL_CAPSULE | Freq: Every day | ORAL | Status: DC
  Administered 2014-11-28 – 2014-12-02 (×5): 600 mg via ORAL
  Filled 2014-11-28 (×5): qty 2

## 2014-11-28 MED ORDER — BETAXOLOL HCL 0.25 % OP SUSP
1.0000 [drp] | Freq: Two times a day (BID) | OPHTHALMIC | Status: DC
  Administered 2014-11-28 – 2014-12-03 (×8): 1 [drp] via OPHTHALMIC
  Filled 2014-11-28 (×3): qty 10

## 2014-11-28 MED ORDER — HYDRALAZINE HCL 20 MG/ML IJ SOLN
5.0000 mg | Freq: Four times a day (QID) | INTRAMUSCULAR | Status: DC | PRN
  Administered 2014-11-29: 5 mg via INTRAVENOUS
  Filled 2014-11-28: qty 1

## 2014-11-28 MED ORDER — LEVOTHYROXINE SODIUM 100 MCG PO TABS
125.0000 ug | ORAL_TABLET | Freq: Every day | ORAL | Status: DC
  Administered 2014-11-29 – 2014-12-03 (×5): 125 ug via ORAL
  Filled 2014-11-28 (×10): qty 1

## 2014-11-28 MED ORDER — SODIUM CHLORIDE 0.9 % IJ SOLN
3.0000 mL | Freq: Two times a day (BID) | INTRAMUSCULAR | Status: DC
Start: 2014-11-28 — End: 2014-12-03
  Administered 2014-11-28 – 2014-12-03 (×8): 3 mL via INTRAVENOUS

## 2014-11-28 MED ORDER — ONDANSETRON HCL 4 MG PO TABS: 4.0000 mg | ORAL_TABLET | Freq: Four times a day (QID) | ORAL | Status: DC | PRN

## 2014-11-28 MED ORDER — SODIUM CHLORIDE 0.9 % IV SOLN
INTRAVENOUS | Status: DC
  Administered 2014-11-28 – 2014-12-01 (×2): via INTRAVENOUS

## 2014-11-28 MED ORDER — PANTOPRAZOLE SODIUM 40 MG PO TBEC
40.0000 mg | DELAYED_RELEASE_TABLET | Freq: Every day | ORAL | Status: DC
  Administered 2014-11-28 – 2014-12-03 (×6): 40 mg via ORAL
  Filled 2014-11-28 (×6): qty 1

## 2014-11-28 MED ORDER — FUROSEMIDE 10 MG/ML IJ SOLN
40.0000 mg | Freq: Every day | INTRAMUSCULAR | Status: DC
  Administered 2014-11-29: 40 mg via INTRAVENOUS
  Filled 2014-11-28: qty 4

## 2014-11-28 MED ORDER — CHOLECALCIFEROL 10 MCG (400 UNIT) PO TABS
400.0000 [IU] | ORAL_TABLET | Freq: Every day | ORAL | Status: DC
  Administered 2014-11-28 – 2014-11-29 (×2): 400 [IU] via ORAL
  Filled 2014-11-28 (×2): qty 1

## 2014-11-28 NOTE — Progress Notes (Signed)
RT came to draw ABG. Results showed panic value on CO2 and Dr. Regenia Skeeter notified.  BiPAP initiated per MD. Patient is comfortable on 14/5, but Vt remains 250-314 mL. RN aware of patient placed on BiPAP. MD to be notified of low volumes.

## 2014-11-28 NOTE — Telephone Encounter (Signed)
Randy calling. Stating pt has been confused since Saturday night. She is saying she doesn't know what is wrong and keeps asking where she is. He said last night when she was changed her urine smelled like ammonia. He said she is running a low grade fever as well. She was not dc with additional antibiotics. Right now she is bed ridden and he wants [place] to call order to [facility] for straight cath to get sample and check for UTI. He said pt has been drinking fluids but only food in several days was some yogurt.  Randy cell # [phone], if he doesn't answer leave msg and he will call back asap

## 2014-11-28 NOTE — H&P (Signed)
[facility] History and Physical  LORITA FORINASH XQJ:[id number] DOB: [birth year] DOA: 11/28/2014  Referring physician: ER physician: Dr. Sherwood Gambler  PCP: Garnet Koyanagi, DO  Chief Complaint: altered mental status, confusion   HPI:  81 y.o. female with a past medical history significant for dementia, chronic COPD on 4 L home oxygen, chronic diastolic heart failure, squamous cancer of the skin (not treated per patient request in past),  hypertension and hypothyroidism, recently hospitalized for E.Coli UTI (has gotten fosfomycin in hospital and has gotten bactrim at home). She was brought by her son to ED with reports of worsening mental status changes, confusion over last day or os prior to this admission. No reports of abdominal pain, nausea or vomiting. No respiratory distress. No fever. No falls or loss of coconsciousness.   In ED, BP was 141/65, HR 70-129, RR 13-29, and oxygen saturation was 81-100%. She has required BiPAP to keep O2 sats above 90%. Her blood work was notable for hemoglobin of 9.3, potassium of 5.7 (treated with lasix IV). Troponin was WNL. CT head old bilateral basal ganglia lacunar infarcts. CXR showed pulmonary edema with bilateral pleural effusion and she has received lasix 40 mg IV in ED and once on admission to floor unit. She was started on Primaxin for possible recurrent UTI. Her UA on admission showed moderate leukocytes and WBC 21-50. Because she required BiPAP she was admitted to SDU.    Assessment & Plan    Principal Problem:   Acute metabolic encephalopathy - Likely combination of dementia and UTI - Treated for UTI with primaxin - Obtain PT/OT evaluation once pt able to participate   Active Problems:   UTI (lower urinary tract infection) / SIRS (systemic inflammatory response syndrome) - UA on admission showed moderate leukocytes. SIRS criteria with tachycardia, tachypnea and possible UTI - Treat with primaxin - Follow up urine culture results    Dyslipidemia - Continue statin therapy and fenofibrate     Essential hypertension - Started lasix 40 mg IV daily. She is not on lasix at home.    Chronic diastolic CHF (congestive heart failure) / Pleural effusion, bilateral / Acute pulmonary edema - As seen on CXR - Started lasix 40 mg IV daily - Will defer on further evaluation with ECHO. Pt is DNR and likely not candidate for aggressive diagnostic and interventional studies.  - Continue plavix      COPD, severe / Acute on chronic respiratory failure with hypoxia - Manage with BiPAP - Continue duoneb as needed for shortness of breath or wheezing     CKD (chronic kidney disease), stage III - Recent baseline Cr 1.13  - Cr WNL on this admission    Hyperkalemia - Likley from potassium supplementation - Potassium on hold - Given lasix 40 gm IV in ED     Anemia of chronic disease - Due to CKD - hemoglobin stable    Depression - Continue Zoloft     Hypothyroidism - Continue synthroid    DVT prophylaxis:  - SCD's bilaterally, plavix   Radiological Exams on Admission: Dg Chest 2 View 11/28/2014  Pulmonary edema with bilateral pleural effusions which have increased since the comparison study.     Ct Head Wo Contrast 11/28/2014  Old bilateral basal ganglia lacunar infarcts.  Atrophy, chronic small vessel disease.  No acute findings.      EKG: I have personally reviewed EKG. EKG shows sinus, atrial tachcyardia  Code Status: DNR/DNI Family Communication: Family not at the bedside  Disposition Plan:  Admit for further evaluation, SDU since she requires BiPAP  Leisa Lenz, MD  [facility] Pager [phone]  Time spent in minutes: 75 minutes  Review of Systems:  Unable to obtain due to patient's altered mental status, dementia.   Past Medical History  Diagnosis Date  . Hyperlipidemia   . Hypertension   . Osteopenia   . Thyroid disease   . B12 deficiency   . Arthritis   . Memory loss   . Knee pain     RIGHT  .  Gait disturbance   . COPD (chronic obstructive pulmonary disease)   . Cancer     Squamous cell skin cancer, no treatment   Past Surgical History  Procedure Laterality Date  . Appendectomy    . Cholecystectomy    . Abdominal hysterectomy    . Oophorectomy     Social History:  reports that she quit smoking about 43 years ago. Her smoking use included Cigarettes. She has a 50 pack-year smoking history. She has never used smokeless tobacco. She reports that she drinks alcohol. She reports that she does not use illicit drugs.  Allergies  Allergen Reactions  . Diltiazem Hcl Other (See Comments)    Unknown reaction  . Neomycin Other (See Comments)    Unknown reaction (a long time ago)    Family History:  Family History  Problem Relation Age of Onset  . Colon cancer    . Melanoma    . Cancer Other     COLON...1ST DEGREE RELATIVE     Prior to Admission medications   Medication Sig Start Date End Date Taking? Authorizing Provider  atorvastatin (LIPITOR) 10 MG tablet Take 1 tablet (10 mg total) by mouth daily. 05/28/14  Yes Yvonne R Lowne, DO  betaxolol (BETOPTIC-S) 0.25 % ophthalmic suspension Place 1 drop into both eyes 2 (two) times daily.     Yes Historical Provider, MD  Cholecalciferol (VITAMIN D PO) Take 1 tablet by mouth daily.   Yes Historical Provider, MD  clopidogrel (PLAVIX) 75 MG tablet TAKE 1 BY MOUTH DAILY WITH BREAKFAST Patient taking differently: TAKE 75 MG BY MOUTH DAILY WITH BREAKFAST 10/10/14  Yes Yvonne R Lowne, DO  dextromethorphan-guaiFENesin (MUCINEX DM) 30-600 MG per 12 hr tablet Take 1 tablet by mouth 2 (two) times daily. 08/26/14  Yes Geradine Girt, DO  fenofibrate (TRICOR) 145 MG tablet TAKE 1 BY MOUTH DAILY Patient taking differently: TAKE 145 MG BY MOUTH DAILY 06/25/14  Yes Yvonne R Lowne, DO  gabapentin (NEURONTIN) 600 MG tablet Take 1 tablet (600 mg total) by mouth at bedtime. 10/24/13  Yes Yvonne R Lowne, DO  Ibuprofen-Diphenhydramine HCl (ADVIL PM) 200-25  MG CAPS Take 2 tablets by mouth at bedtime as needed (sleep).   Yes Historical Provider, MD  levothyroxine (SYNTHROID, LEVOTHROID) 125 MCG tablet Take 125 mcg by mouth daily before breakfast.   Yes Historical Provider, MD  Misc. Devices [facility]) MISC 24 inch wheel chair 09/25/14  Yes Rosalita Chessman, DO  Multiple Vitamin (MULTIVITAMIN WITH MINERALS) TABS tablet Take 1 tablet by mouth daily.   Yes Historical Provider, MD  pantoprazole (PROTONIX) 40 MG tablet TAKE 1 BY MOUTH DAILY Patient taking differently: TAKE 40 MG BY MOUTH DAILY 11/13/14  Yes Alferd Apa Lowne, DO  potassium chloride SA (K-DUR,KLOR-CON) 20 MEQ tablet Take 1 tablet (20 mEq total) by mouth daily. 10/10/14  Yes Yvonne R Lowne, DO  sertraline (ZOLOFT) 50 MG tablet Take 1 tablet (50 mg total) by mouth daily. 10/24/13  Yes Rosalita Chessman, DO   Physical Exam: Filed Vitals:   11/28/14 1345 11/28/14 1348 11/28/14 1352 11/28/14 1502  BP: 147/52   156/46  Pulse:  77  76  Temp:   98.1 F (36.7 C)   TempSrc:   Rectal   Resp: 29 26  21   SpO2:  97%  99%    Physical Exam  Constitutional: Appears well-developed and well-nourished. No distress.  HENT: Normocephalic. No tonsillar erythema or exudates Eyes: Conjunctivae  are normal. No scleral icterus.  Neck: Normal ROM. Neck supple. No JVD. No tracheal deviation. No thyromegaly.  CVS: RRR, S1/S2 appreciated, (+) SEM .  Pulmonary: rales at based, diminished breath sounds, no wheezing.  Abdominal: Soft. BS +,  no distension, tenderness, rebound or guarding.  Musculoskeletal: Normal range of motion. No edema and no tenderness.  Lymphadenopathy: No lymphadenopathy noted, cervical, inguinal. Neuro: Alert. Normal reflexes, muscle tone coordination. No focal neurologic deficits. Skin: Skin is warm and dry.  Psychiatric: Normal mood and affect.  Labs on Admission:  Basic Metabolic Panel:  Recent Labs Lab 11/22/14 1718 11/23/14 0413 11/28/14 1345  NA 144 146* 145  K 4.2 3.8 5.7*  CL  105 110 103  CO2 34* 30 37*  GLUCOSE 115* 96 101*  BUN 19 18 13   CREATININE 1.13* 0.95 0.84  CALCIUM 8.9 8.5* 9.8   Liver Function Tests:  Recent Labs Lab 11/22/14 1718 11/28/14 1345  AST 49* 62*  ALT 21 23  ALKPHOS 48 52  BILITOT 0.9 1.3*  PROT 5.7* 5.4*  ALBUMIN 2.7* 2.6*   No results for input(s): LIPASE, AMYLASE in the last 168 hours. No results for input(s): AMMONIA in the last 168 hours. CBC:  Recent Labs Lab 11/22/14 1718 11/28/14 1345  WBC 8.4 6.4  NEUTROABS 6.4 4.6  HGB 8.8* 9.3*  HCT 29.2* 31.5*  MCV 105.0* 106.4*  PLT 161 221   Cardiac Enzymes:  Recent Labs Lab 11/28/14 1345  TROPONINI <0.03   BNP: Invalid input(s): POCBNP CBG: No results for input(s): GLUCAP in the last 168 hours.  If 7PM-7AM, please contact night-coverage [email or web address] Password [facility] 11/28/2014, 4:36 PM

## 2014-11-28 NOTE — ED Notes (Signed)
Bed: IR51 Expected date:  Expected time:  Means of arrival:  Comments: Ems- possibly septic

## 2014-11-28 NOTE — Progress Notes (Signed)
RT notified MD of BiPAP settings; patient waiting to be moved to ICU/Stepdown. Patient currently comfortable and no distress noted.

## 2014-11-28 NOTE — Telephone Encounter (Signed)
She is in ER--- if it is not ordered on d/c --- yes -- she needs Sw order

## 2014-11-28 NOTE — ED Notes (Signed)
Patient was brought in by EMS from home. Patient has home health nurses and was recently seen here 9/22 for a UTI. Patient was discharged Saturday from [facility]. Son reports AMS since Saturday. Patient disoriented x4. Patient is alert with decreased response. Son is POA. On home O2 but was tachypenic and shallow. Patient on 2L via [place]. Home health called EMS due to decreased oral intake and inability to breath well.

## 2014-11-28 NOTE — ED Notes (Signed)
Report given to Denise, RN.

## 2014-11-28 NOTE — Telephone Encounter (Signed)
Spoke with Kirsten Flores who says that she's not seeing Kirsten Flores this week, but will see her next week.  This week, the patient is being cared for by Royston Cowper., RN.  Nevertheless, to [facility] says that pt's son is suppose to be talking with his sisters regarding the possibility of hospice.  Per Kirsten Flores, the patient sounds to have really declined since she discharged her a couple of weeks ago.  Per Kirsten Flores, at the time of discharge, pt was up, eating and walking some.  Kirsten Flores says that social work has not been ordered for patient.   Should an order be placed?

## 2014-11-28 NOTE — Telephone Encounter (Signed)
noted 

## 2014-11-28 NOTE — Telephone Encounter (Addendum)
Lisette Abu, RN from [place] to discuss pt's current plan of care.

## 2014-11-28 NOTE — Progress Notes (Signed)
Patient was transported to ICU/Stepdown unit room 1228 on BiPAP. Patient is stable and no complications during transport. RN at bedside. RT will continue to monitor patient.

## 2014-11-28 NOTE — Telephone Encounter (Signed)
FYI

## 2014-11-28 NOTE — ED Provider Notes (Signed)
CSN: [id number]     Arrival date & time 11/28/14  1304 History   First MD Initiated Contact with Patient 11/28/14 1309     Chief Complaint  Patient presents with  . Altered Mental Status     (Consider location/radiation/quality/duration/timing/severity/associated sxs/prior Treatment) HPI  81 year old female presents with worsening altered mental status over the last few days. Patient has a history of squamous cell Skin cancer that is not being treated as well as memory loss, and a recent UTI. Was admitted about 6 days ago for a UTI and altered mental status. Was generally weak but seems to be improving after being given antibiotics. Urine culture was sensitive to Bactrim which she was given one dose of the hospital along with one dose of fosphomyosin. She was discharged home without antibiotics. Has progressively declined becoming less alert and more confused than typical. She has bad short-term memory at baseline but good long-term memory. No fevers. Son does note that the patient complained of chest pain although this is according to the home health nurse, he did not witness this and she has not currently complaining of it.  Past Medical History  Diagnosis Date  . Hyperlipidemia   . Hypertension   . Osteopenia   . Thyroid disease   . B12 deficiency   . Arthritis   . Memory loss   . Knee pain     RIGHT  . Gait disturbance   . COPD (chronic obstructive pulmonary disease)   . Cancer     Squamous cell skin cancer, no treatment   Past Surgical History  Procedure Laterality Date  . Appendectomy    . Cholecystectomy    . Abdominal hysterectomy    . Oophorectomy     Family History  Problem Relation Age of Onset  . Colon cancer    . Melanoma    . Cancer Other     COLON...1ST DEGREE RELATIVE   Social History  Substance Use Topics  . Smoking status: Former Smoker -- 2.00 packs/day for 25 years    Types: Cigarettes    Quit date: 03/03/1971  . Smokeless tobacco: Never Used  .  Alcohol Use: 0.0 oz/week    0 Standard drinks or equivalent per week     Comment: 1 cocktail in the evening and a glass of wine - not everyday but doesn't state how often   OB History    No data available     Review of Systems  Unable to perform ROS: Dementia      Allergies  Diltiazem hcl and Neomycin  Home Medications   Prior to Admission medications   Medication Sig Start Date End Date Taking? Authorizing Provider  atorvastatin (LIPITOR) 10 MG tablet Take 1 tablet (10 mg total) by mouth daily. 05/28/14   Alferd Apa Lowne, DO  betaxolol (BETOPTIC-S) 0.25 % ophthalmic suspension Place 1 drop into both eyes 2 (two) times daily.      Historical Provider, MD  Cholecalciferol (VITAMIN D PO) Take 1 tablet by mouth daily.    Historical Provider, MD  clopidogrel (PLAVIX) 75 MG tablet TAKE 1 BY MOUTH DAILY WITH BREAKFAST Patient taking differently: TAKE 75 MG BY MOUTH DAILY WITH BREAKFAST 10/10/14   Alferd Apa Lowne, DO  dextromethorphan-guaiFENesin (MUCINEX DM) 30-600 MG per 12 hr tablet Take 1 tablet by mouth 2 (two) times daily. 08/26/14   Geradine Girt, DO  fenofibrate (TRICOR) 145 MG tablet TAKE 1 BY MOUTH DAILY Patient taking differently: TAKE 145 MG BY MOUTH DAILY  06/25/14   Rosalita Chessman, DO  gabapentin (NEURONTIN) 600 MG tablet Take 1 tablet (600 mg total) by mouth at bedtime. 10/24/13   Rosalita Chessman, DO  Ibuprofen-Diphenhydramine HCl (ADVIL PM) 200-25 MG CAPS Take 2 tablets by mouth at bedtime as needed (sleep).    Historical Provider, MD  levothyroxine (SYNTHROID, LEVOTHROID) 125 MCG tablet Take 125 mcg by mouth daily before breakfast.    Historical Provider, MD  Misc. Devices [facility]) MISC 24 inch wheel chair 09/25/14   Rosalita Chessman, DO  Multiple Vitamin (MULTIVITAMIN WITH MINERALS) TABS tablet Take 1 tablet by mouth daily.    Historical Provider, MD  pantoprazole (PROTONIX) 40 MG tablet TAKE 1 BY MOUTH DAILY Patient taking differently: TAKE 40 MG BY MOUTH DAILY 11/13/14    Rosalita Chessman, DO  potassium chloride SA (K-DUR,KLOR-CON) 20 MEQ tablet Take 1 tablet (20 mEq total) by mouth daily. 10/10/14   Rosalita Chessman, DO  sertraline (ZOLOFT) 50 MG tablet Take 1 tablet (50 mg total) by mouth daily. 10/24/13   Alferd Apa Lowne, DO   BP 147/52 mmHg  Pulse 77  Temp(Src) 98.1 F (36.7 C) (Rectal)  Resp 26  SpO2 97% Physical Exam  Constitutional: She appears well-developed and well-nourished.  HENT:  Head: Normocephalic and atraumatic.  Right Ear: External ear normal.  Left Ear: External ear normal.  Nose: Nose normal.  Squamous cell carcinoma to scalp/forehead  Eyes: Right eye exhibits no discharge. Left eye exhibits no discharge.  Neck: Neck supple.  Cardiovascular: Normal rate and regular rhythm.   Murmur heard. Pulmonary/Chest: Effort normal. She has rales.  Abdominal: Soft. She exhibits no distension. There is no tenderness.  Neurological: She is alert. She is disoriented.  Skin: Skin is warm and dry.  Nursing note and vitals reviewed.   ED Course  Procedures (including critical care time) Labs Review Labs Reviewed  COMPREHENSIVE METABOLIC PANEL - Abnormal; Notable for the following:    Potassium 5.7 (*)    CO2 37 (*)    Glucose, Bld 101 (*)    Total Protein 5.4 (*)    Albumin 2.6 (*)    AST 62 (*)    Total Bilirubin 1.3 (*)    GFR calc non Af Amer 60 (*)    All other components within normal limits  CBC WITH DIFFERENTIAL/PLATELET - Abnormal; Notable for the following:    RBC 2.96 (*)    Hemoglobin 9.3 (*)    HCT 31.5 (*)    MCV 106.4 (*)    MCHC 29.5 (*)    All other components within normal limits  URINALYSIS, ROUTINE W REFLEX MICROSCOPIC (NOT AT [facility]) - Abnormal; Notable for the following:    APPearance CLOUDY (*)    Hgb urine dipstick SMALL (*)    Leukocytes, UA MODERATE (*)    All other components within normal limits  BRAIN NATRIURETIC PEPTIDE - Abnormal; Notable for the following:    B Natriuretic Peptide 540.9 (*)    All other  components within normal limits  URINE MICROSCOPIC-ADD ON - Abnormal; Notable for the following:    Casts HYALINE CASTS (*)    All other components within normal limits  I-STAT CG4 LACTIC ACID, ED - Abnormal; Notable for the following:    Lactic Acid, Venous 0.42 (*)    All other components within normal limits  URINE CULTURE  TROPONIN I  BLOOD GAS, ARTERIAL  I-STAT CG4 LACTIC ACID, ED    Imaging Review Dg Chest 2 View  11/28/2014   CLINICAL DATA:  Tachypnea.  EXAM: CHEST  2 VIEW  COMPARISON:  PA and lateral chest 11/22/2014.  FINDINGS: There is increased pulmonary edema and moderate bilateral pleural effusions. Cardiomegaly is identified. Basilar airspace disease is likely atelectasis. No pneumothorax.  IMPRESSION: Pulmonary edema with bilateral pleural effusions which have increased since the comparison study.   Electronically Signed   By: Inge Rise M.D.   On: 11/28/2014 14:41   Ct Head Wo Contrast  11/28/2014   CLINICAL DATA:  Altered mental status  EXAM: CT HEAD WITHOUT CONTRAST  TECHNIQUE: Contiguous axial images were obtained from the base of the skull through the vertex without intravenous contrast.  COMPARISON:  07/22/2014  FINDINGS: There is atrophy and chronic small vessel disease changes. Old bilateral basal ganglia lacunar infarcts. No acute intracranial abnormality. Specifically, no hemorrhage, hydrocephalus, mass lesion, acute infarction, or significant intracranial injury. No acute calvarial abnormality. Rounded soft tissue in the right maxillary sinus, stable. Mastoid air cells are clear. Orbital soft tissues unremarkable.  IMPRESSION: Old bilateral basal ganglia lacunar infarcts.  Atrophy, chronic small vessel disease.  No acute findings.   Electronically Signed   By: Rolm Baptise M.D.   On: 11/28/2014 14:29   I have personally reviewed and evaluated these images and lab results as part of my medical decision-making.   EKG Interpretation   Date/Time:  Wednesday  November 28 2014 13:28:26 EDT Ventricular Rate:  128 PR Interval:  115 QRS Duration: 91 QT Interval:  365 QTC Calculation: 533 R Axis:   -56 Text Interpretation:  Sinus or ectopic atrial tachycardia Sinus pause LAD,  consider left anterior fascicular block Prolonged QT interval rate is  faster, otherwise no significant change from Sept 22 2016 Confirmed by  Regenia Skeeter  MD, [place] [phone]) on 11/28/2014 1:50:17 PM      MDM   Final diagnoses:  Acute pulmonary edema  Acute respiratory failure with hypercapnia  UTI (lower urinary tract infection)    Patient's mental status changes most likely multifactorial. She is awake and alert but not acting like her normal self per the son at the bedside. She has an elevated CO2 with mild respiratory acidosis. She also appears to have a urinary tract infection. She is not hypoxic on her home oxygen but has significant pleural effusions with pulmonary edema. She is currently a DO NOT RESUSCITATE. At this point she is managing her airway well with no acute signs of decompensation. She will be placed on BiPAP for both the edema as well as the respiratory acidosis. Plan to admit to the hospitalist, Dr. Charlies Silvers.    Sherwood Gambler, MD 11/28/14 [phone]

## 2014-11-28 NOTE — Progress Notes (Signed)
ED CM notified Kristen of [facility] of pt admission to be followed for d/c needs

## 2014-11-28 NOTE — ED Notes (Signed)
Patient transported to CT 

## 2014-11-28 NOTE — Progress Notes (Signed)
ANTIBIOTIC CONSULT NOTE - INITIAL  Pharmacy Consult for primaxin Indication: UTI  Allergies  Allergen Reactions  . Diltiazem Hcl Other (See Comments)    Unknown reaction  . Neomycin Other (See Comments)    Unknown reaction (a long time ago)    Patient Measurements: weight 85.5 kg, height 65 inches    Vital Signs: Temp: 98.1 F (36.7 C) (09/28 1352) Temp Source: Rectal (09/28 1352) BP: 148/45 mmHg (09/28 1615) Pulse Rate: 72 (09/28 1615) Intake/Output from previous day:   Intake/Output from this shift:    Labs:  Recent Labs  11/28/14 1345  WBC 6.4  HGB 9.3*  PLT 221  CREATININE 0.84   Estimated Creatinine Clearance: 49 mL/min (by C-G formula based on Cr of 0.84). No results for input(s): VANCOTROUGH, VANCOPEAK, VANCORANDOM, GENTTROUGH, GENTPEAK, GENTRANDOM, TOBRATROUGH, TOBRAPEAK, TOBRARND, AMIKACINPEAK, AMIKACINTROU, AMIKACIN in the last 72 hours.   Microbiology: Recent Results (from the past 720 hour(s))  Urine culture     Status: None   Collection Time: 11/22/14  5:12 PM  Result Value Ref Range Status   Specimen Description URINE, CATHETERIZED  Final   Special Requests NONE  Final   Culture   Final    >=100,000 COLONIES/mL ESCHERICHIA COLI Confirmed Extended Spectrum Beta-Lactamase Producer (ESBL) Performed at [facility]    Report Status 11/26/2014 FINAL  Final   Organism ID, Bacteria ESCHERICHIA COLI  Final      Susceptibility   Escherichia coli - MIC*    AMPICILLIN >=32 RESISTANT Resistant     CEFAZOLIN >=64 RESISTANT Resistant     CEFTRIAXONE >=64 RESISTANT Resistant     CIPROFLOXACIN >=4 RESISTANT Resistant     GENTAMICIN >=16 RESISTANT Resistant     IMIPENEM <=0.25 SENSITIVE Sensitive     NITROFURANTOIN 32 SENSITIVE Sensitive     TRIMETH/SULFA <=20 SENSITIVE Sensitive     AMPICILLIN/SULBACTAM >=32 RESISTANT Resistant     PIP/TAZO <=4 SENSITIVE Sensitive     * >=100,000 COLONIES/mL ESCHERICHIA COLI    Medical History: Past Medical  History  Diagnosis Date  . Hyperlipidemia   . Hypertension   . Osteopenia   . Thyroid disease   . B12 deficiency   . Arthritis   . Memory loss   . Knee pain     RIGHT  . Gait disturbance   . COPD (chronic obstructive pulmonary disease)   . Cancer     Squamous cell skin cancer, no treatment   Assessment: Patient is an 81 y.o F who was recently discharged from [facility] on 11/24/14.  With that admission, she had ESBL ecoli in ucx and was treated with one dose of fosfomycin on 9/22.  She presented to the ED on 9/28 with c/o AMS, SOB, and poor oral intake.  To start Primaxin for UTI.  - scr 0.84 (crcl~51, rounded scr to 1) - 9/28 UA: moderate leukocyte, nitrite (-)  9/28 bactrim>>  9/28 ucx:  9/22 ucx: ESBL ecoli (S= primaxin, nitrof, zosyn, bactrim)   Goal of Therapy:  Eradication of infection  Plan:  - primaxin 500 mg IV q8h - f/u culture  Pham, Anh P 11/28/2014,5:19 PM

## 2014-11-28 NOTE — Telephone Encounter (Signed)
Caller name: Kirsten Flores  Call back number: [phone]   Reason for call:  Kirsten Flores wanted to inform PCP patient is on her way in ambulance to [facility] due to ? UTI

## 2014-11-29 DIAGNOSIS — J90 Pleural effusion, not elsewhere classified: Secondary | ICD-10-CM

## 2014-11-29 DIAGNOSIS — Z515 Encounter for palliative care: Secondary | ICD-10-CM | POA: Insufficient documentation

## 2014-11-29 LAB — CBC WITH DIFFERENTIAL/PLATELET
BASOS PCT: 0 %
Basophils Absolute: 0 10*3/uL (ref 0.0–0.1)
EOS ABS: 0.1 10*3/uL (ref 0.0–0.7)
EOS PCT: 2 %
HCT: 29.7 % — ABNORMAL LOW (ref 36.0–46.0)
HEMOGLOBIN: 9.1 g/dL — AB (ref 12.0–15.0)
LYMPHS PCT: 20 %
Lymphs Abs: 1.3 10*3/uL (ref 0.7–4.0)
MCH: 32.3 pg (ref 26.0–34.0)
MCHC: 30.6 g/dL (ref 30.0–36.0)
MCV: 105.3 fL — ABNORMAL HIGH (ref 78.0–100.0)
MONO ABS: 0.5 10*3/uL (ref 0.1–1.0)
Monocytes Relative: 8 %
NEUTROS PCT: 70 %
Neutro Abs: 4.4 10*3/uL (ref 1.7–7.7)
PLATELETS: 202 10*3/uL (ref 150–400)
RBC: 2.82 MIL/uL — AB (ref 3.87–5.11)
RDW: 15.1 % (ref 11.5–15.5)
WBC: 6.3 10*3/uL (ref 4.0–10.5)

## 2014-11-29 LAB — COMPREHENSIVE METABOLIC PANEL
ALBUMIN: 2.6 g/dL — AB (ref 3.5–5.0)
ALT: 20 U/L (ref 14–54)
ANION GAP: 8 (ref 5–15)
AST: 41 U/L (ref 15–41)
Alkaline Phosphatase: 49 U/L (ref 38–126)
BILIRUBIN TOTAL: 1 mg/dL (ref 0.3–1.2)
BUN: 17 mg/dL (ref 6–20)
CHLORIDE: 100 mmol/L — AB (ref 101–111)
CO2: 38 mmol/L — ABNORMAL HIGH (ref 22–32)
Calcium: 9.8 mg/dL (ref 8.9–10.3)
Creatinine, Ser: 0.81 mg/dL (ref 0.44–1.00)
GFR calc Af Amer: >60 mL/min (ref >60)
GFR calc non Af Amer: >60 mL/min (ref >60)
GLUCOSE: 83 mg/dL (ref 65–99)
POTASSIUM: 4.1 mmol/L (ref 3.5–5.1)
Sodium: 146 mmol/L — ABNORMAL HIGH (ref 135–145)
Total Protein: 5.1 g/dL — ABNORMAL LOW (ref 6.5–8.1)

## 2014-11-29 LAB — GLUCOSE, CAPILLARY: GLUCOSE-CAPILLARY: 74 mg/dL (ref 65–99)

## 2014-11-29 LAB — APTT: APTT: 28 s (ref 24–37)

## 2014-11-29 LAB — URINE CULTURE: CULTURE: NO GROWTH

## 2014-11-29 LAB — TSH: TSH: 3.083 u[IU]/mL (ref 0.350–4.500)

## 2014-11-29 LAB — PHOSPHORUS: Phosphorus: 2.4 mg/dL — ABNORMAL LOW (ref 2.5–4.6)

## 2014-11-29 LAB — PROTIME-INR
INR: 1.32 (ref 0.00–1.49)
Prothrombin Time: 16.5 seconds — ABNORMAL HIGH (ref 11.6–15.2)

## 2014-11-29 LAB — MAGNESIUM: MAGNESIUM: 1.7 mg/dL (ref 1.7–2.4)

## 2014-11-29 MED ORDER — SENNOSIDES-DOCUSATE SODIUM 8.6-50 MG PO TABS
1.0000 | ORAL_TABLET | Freq: Every day | ORAL | Status: DC
  Administered 2014-11-29 – 2014-12-01 (×3): 1 via ORAL
  Filled 2014-11-29 (×3): qty 1

## 2014-11-29 MED ORDER — HYDRALAZINE HCL 20 MG/ML IJ SOLN: 10.0000 mg | Freq: Three times a day (TID) | INTRAMUSCULAR | Status: DC

## 2014-11-29 MED ORDER — HALOPERIDOL LACTATE 2 MG/ML PO CONC
2.0000 mg | Freq: Four times a day (QID) | ORAL | Status: DC | PRN
  Filled 2014-11-29: qty 1

## 2014-11-29 MED ORDER — FUROSEMIDE 40 MG PO TABS
60.0000 mg | ORAL_TABLET | Freq: Every day | ORAL | Status: DC
  Administered 2014-11-30 – 2014-12-03 (×4): 60 mg via ORAL
  Filled 2014-11-29 (×8): qty 1

## 2014-11-29 MED ORDER — MORPHINE SULFATE (PF) 2 MG/ML IV SOLN: 1.0000 mg | INTRAVENOUS | Status: DC | PRN

## 2014-11-29 MED ORDER — ENSURE ENLIVE PO LIQD
237.0000 mL | Freq: Two times a day (BID) | ORAL | Status: DC
  Administered 2014-11-29 – 2014-12-03 (×7): 237 mL via ORAL

## 2014-11-29 MED ORDER — MORPHINE SULFATE (CONCENTRATE) 10 MG/0.5ML PO SOLN
10.0000 mg | ORAL | Status: DC | PRN
  Administered 2014-11-29 (×2): 10 mg via ORAL
  Filled 2014-11-29 (×2): qty 0.5

## 2014-11-29 NOTE — Clinical Documentation Improvement (Signed)
Hospitalist  Can the diagnosis of systemic infection be further specified?   Sepsis - specify causative organism if known  Determine if there is Severe Sepsis (Sepsis with organ dysfunction - specify), Septic Shock if present  Identify etiology of Sepsis - Device, Implant, Graft, Infusion, Abortion  Specify organ dysfunction - Respiratory Failure, Encephalopathy, Acute Kidney Failure, Pneumonia, UTI, Other (specify), Unable to Clinically Determine}  Other  Clinically Undetermined  Document any associated diagnoses/conditions.   Supporting Information: Sirs criteria with tachycardia, tachypnea, and possible UTI per 9/28 progress notes.   Please exercise your independent, professional judgment when responding. A specific answer is not anticipated or expected.   Thank You,  [place] [phone]

## 2014-11-29 NOTE — Progress Notes (Signed)
[facility]  Patient Status: Active (receiving services up to time of hospitalization)  [facility] is providing the following services: RN, PT, OT and MSW  If patient discharges after hours, please call [phone].   Kirsten Flores 11/29/2014, 8:38 AM

## 2014-11-29 NOTE — Care Management Note (Signed)
Case Management Note  Patient Details  Name: Kirsten Flores MRN: [id number] Date of Birth: 21-Dec-1923  Subjective/Objective:                 hypoxia   Action/Plan:Date:  Sept. 29, 2016 U.R. performed for needs and level of care. Will continue to follow for Case Management needs.  Velva Harman, RN, BSN, [place]   [phone]  Expected Discharge Date:   (unknown)               Expected Discharge Plan:  [place]  In-House Referral:  NA  Discharge planning Services  CM Consult  Post Acute Care Choice:  NA, Home Health Choice offered to:  NA  DME Arranged:    DME Agency:     HH Arranged:    [place]:  [place]  Status of Service:     Medicare Important Message Given:    Date Medicare IM Given:    Medicare IM give by:    Date Additional Medicare IM Given:    Additional Medicare Important Message give by:     If discussed at [place] of Stay Meetings, dates discussed:    Additional Comments:  Leeroy Cha, RN 11/29/2014, 11:27 AM

## 2014-11-29 NOTE — Progress Notes (Addendum)
Consultation Note Date: 11/29/2014   Patient Name: Kirsten Flores  DOB: 1923-07-27  MRN: [id number]  Age / Sex: 82 y.o., female   PCP: Rosalita Chessman, DO Referring Physician: Robbie Lis, MD  Reason for Consultation: Establishing goals of care  Palliative Care Assessment and Plan Summary of Established Goals of Care and Medical Treatment Preferences   Please see palliative consult done on 08/24/14. Recommendations at that time were SNF with Transition to hospice care-appears she was discharged from SNF but without hospice services. I have spoken with her daughter in great detail. We are not currently providing care that is aligned with her wishes to allow for a natural death. Per her daughter this will be the last hospitalization and they desire home with hospice.   Clinical Assessment/Narrative: 81 yo woman 5th admission in 6 months. Recurrent UTI, CHF, COPD on home O2 recently in SNF and discharged home-family even at that time wanted a comfort approach but wasn't excatly sure how to make that transition. Per daughter home health nurse call ed [phone] before she and her brother could discuss next steps.   Contacts/Participants in Discussion: Primary Decision Maker: Daughter Hiedi and son [name]   HCPOA: yes  Shared decision making with two children.  Code Status/Advance Care Planning:  DNR  Symptom Management:   Treat pulmonary edema with lasix, treat pain and suffering, UTI should be treated if oral antibiotics will work.  No Bipap or pressors or aggressive medical interventions    Palliative Prophylaxis: Bowel regimen  Additional Recommendations (Limitations, Scope, Preferences):  Hopsice Care, no rehospitalization  No ICU  Place a Foley so we can check a culture, will help with diuresis  Delirium precautions  Psycho-social/Spiritual:   Support System: son [name] is a Biomedical scientist at [employer]- his schedule is difficult- he is in the home with her, daughter is in [place] but is a  nurse  Desire for further Chaplaincy support:no  Prognosis: < 6 months  Discharge Planning:  Home with Hospice       Chief Complaint/History of Present Illness: AMS  Primary Diagnoses  Present on Admission:  . UTI (lower urinary tract infection) . Acute on chronic respiratory failure with hypoxia . Chronic diastolic CHF (congestive heart failure) . Pleural effusion, bilateral . CKD (chronic kidney disease), stage III . COPD, severe . Hyperkalemia . Anemia of chronic disease . SIRS (systemic inflammatory response syndrome) . Acute encephalopathy . Depression . Dyslipidemia . Essential hypertension . Dementia . Hypothyroidism . Acute pulmonary edema  Palliative Review of Systems: Unable to obtain I have reviewed the medical record, interviewed the patient and family, and examined the patient. The following aspects are pertinent.  Past Medical History  Diagnosis Date  . Hyperlipidemia   . Hypertension   . Osteopenia   . Thyroid disease   . B12 deficiency   . Arthritis   . Memory loss   . Knee pain     RIGHT  . Gait disturbance   . COPD (chronic obstructive pulmonary disease)   . Cancer     Squamous cell skin cancer, no treatment   Social History   Social History  . Marital Status: Widowed    Spouse Name: N/A  . Number of Children: N/A  . Years of Education: N/A   Occupational History  . RETIRED NURSE    Social History Main Topics  . Smoking status: Former Smoker -- 2.00 packs/day for 25 years    Types: Cigarettes    Quit date:  03/03/1971  . Smokeless tobacco: Never Used  . Alcohol Use: 0.0 oz/week    0 Standard drinks or equivalent per week     Comment: 1 cocktail in the evening and a glass of wine - not everyday but doesn't state how often  . Drug Use: No  . Sexual Activity: Not Currently    Birth Control/ Protection: Post-menopausal   Other Topics Concern  . None   Social History Narrative   HUSBAND DIED IN 29-Dec-2009   Family History    Problem Relation Age of Onset  . Colon cancer    . Melanoma    . Cancer Other     COLON...1ST DEGREE RELATIVE   Scheduled Meds: . atorvastatin  10 mg Oral Daily  . betaxolol  1 drop Both Eyes BID  . cholecalciferol  400 Units Oral Daily  . clopidogrel  75 mg Oral Daily  . feeding supplement (ENSURE ENLIVE)  237 mL Oral BID BM  . fenofibrate  160 mg Oral Daily  . furosemide  40 mg Intravenous Daily  . gabapentin  600 mg Oral QHS  . hydrALAZINE  10 mg Intravenous 3 times per day  . imipenem-cilastatin  500 mg Intravenous Q8H  . levothyroxine  125 mcg Oral QAC breakfast  . multivitamin with minerals  1 tablet Oral Daily  . pantoprazole  40 mg Oral Daily  . sertraline  50 mg Oral Daily  . sodium chloride  3 mL Intravenous Q12H   Continuous Infusions: . sodium chloride 10 mL/hr at 11/28/14 2000   PRN Meds:.acetaminophen **OR** acetaminophen, hydrALAZINE, morphine injection, ondansetron **OR** ondansetron (ZOFRAN) IV Medications Prior to Admission:  Prior to Admission medications   Medication Sig Start Date End Date Taking? Authorizing Provider  atorvastatin (LIPITOR) 10 MG tablet Take 1 tablet (10 mg total) by mouth daily. 05/28/14  Yes Yvonne R Lowne, DO  betaxolol (BETOPTIC-S) 0.25 % ophthalmic suspension Place 1 drop into both eyes 2 (two) times daily.     Yes Historical Provider, MD  Cholecalciferol (VITAMIN D PO) Take 1 tablet by mouth daily.   Yes Historical Provider, MD  clopidogrel (PLAVIX) 75 MG tablet TAKE 1 BY MOUTH DAILY WITH BREAKFAST Patient taking differently: TAKE 75 MG BY MOUTH DAILY WITH BREAKFAST 10/10/14  Yes Yvonne R Lowne, DO  dextromethorphan-guaiFENesin (MUCINEX DM) 30-600 MG per 12 hr tablet Take 1 tablet by mouth 2 (two) times daily. 08/26/14  Yes Geradine Girt, DO  fenofibrate (TRICOR) 145 MG tablet TAKE 1 BY MOUTH DAILY Patient taking differently: TAKE 145 MG BY MOUTH DAILY 06/25/14  Yes Yvonne R Lowne, DO  gabapentin (NEURONTIN) 600 MG tablet Take 1 tablet  (600 mg total) by mouth at bedtime. 10/24/13  Yes Yvonne R Lowne, DO  Ibuprofen-Diphenhydramine HCl (ADVIL PM) 200-25 MG CAPS Take 2 tablets by mouth at bedtime as needed (sleep).   Yes Historical Provider, MD  levothyroxine (SYNTHROID, LEVOTHROID) 125 MCG tablet Take 125 mcg by mouth daily before breakfast.   Yes Historical Provider, MD  Misc. Devices [facility]) MISC 24 inch wheel chair 09/25/14  Yes Rosalita Chessman, DO  Multiple Vitamin (MULTIVITAMIN WITH MINERALS) TABS tablet Take 1 tablet by mouth daily.   Yes Historical Provider, MD  pantoprazole (PROTONIX) 40 MG tablet TAKE 1 BY MOUTH DAILY Patient taking differently: TAKE 40 MG BY MOUTH DAILY 11/13/14  Yes Alferd Apa Lowne, DO  potassium chloride SA (K-DUR,KLOR-CON) 20 MEQ tablet Take 1 tablet (20 mEq total) by mouth daily. 10/10/14  Yes Alferd Apa  Lowne, DO  sertraline (ZOLOFT) 50 MG tablet Take 1 tablet (50 mg total) by mouth daily. 10/24/13  Yes Rosalita Chessman, DO   Allergies  Allergen Reactions  . Diltiazem Hcl Other (See Comments)    Unknown reaction  . Neomycin Other (See Comments)    Unknown reaction (a long time ago)   CBC:    Component Value Date/Time   WBC 6.3 11/29/2014 0400   HGB 9.1* 11/29/2014 0400   HCT 29.7* 11/29/2014 0400   PLT 202 11/29/2014 0400   MCV 105.3* 11/29/2014 0400   NEUTROABS 4.4 11/29/2014 0400   LYMPHSABS 1.3 11/29/2014 0400   MONOABS 0.5 11/29/2014 0400   EOSABS 0.1 11/29/2014 0400   BASOSABS 0.0 11/29/2014 0400   Comprehensive Metabolic Panel:    Component Value Date/Time   NA 146* 11/29/2014 0400   K 4.1 11/29/2014 0400   CL 100* 11/29/2014 0400   CO2 38* 11/29/2014 0400   BUN 17 11/29/2014 0400   CREATININE 0.81 11/29/2014 0400   GLUCOSE 83 11/29/2014 0400   CALCIUM 9.8 11/29/2014 0400   AST 41 11/29/2014 0400   ALT 20 11/29/2014 0400   ALKPHOS 49 11/29/2014 0400   BILITOT 1.0 11/29/2014 0400   PROT 5.1* 11/29/2014 0400   ALBUMIN 2.6* 11/29/2014 0400    Physical Exam: Vital Signs:  BP 177/52 mmHg  Pulse 72  Temp(Src) 98.8 F (37.1 C) (Oral)  Resp 27  Ht 5\' 4"  (1.626 m)  Wt 82.9 kg (182 lb 12.2 oz)  BMI 31.36 kg/m2  SpO2 99% SpO2: SpO2: 99 % O2 Device: O2 Device: Nasal Cannula O2 Flow Rate: O2 Flow Rate (L/min): 4 L/min Intake/output summary:  Intake/Output Summary (Last 24 hours) at 11/29/14 1303 Last data filed at 11/29/14 1200  Gross per 24 hour  Intake    540 ml  Output      0 ml  Net    540 ml   LBM:   Baseline Weight: Weight: 82.9 kg (182 lb 12.2 oz) Most recent weight: Weight: 82.9 kg (182 lb 12.2 oz)  Exam Findings:  Frail, confused, tachypneic +edema         Palliative Performance Scale: 30%              Additional Data Reviewed: Recent Labs     11/28/14  1345  11/29/14  0400  WBC  6.4  6.3  HGB  9.3*  9.1*  PLT  221  202  NA  145  146*  BUN  13  17  CREATININE  0.84  0.81     Time In: 12:00PM Time Out: 1:10PM  Time Total: 70 minutes Greater than 50%  of this time was spent counseling and coordinating care related to the above assessment and plan.  Signed by: Roma Schanz, DO  11/29/2014, 1:03 PM  Please contact Palliative Medicine Team phone at [phone] for questions and concerns.

## 2014-11-29 NOTE — Progress Notes (Addendum)
[facility] PROGRESS NOTE  Kirsten Flores XLK:[id number] DOB: 08/24/1923 DOA: 11/28/2014 PCP: Garnet Koyanagi, DO  Brief narrative:    81 y.o. female with a past medical history significant for dementia, chronic COPD on 4 L home oxygen, chronic diastolic heart failure, squamous cancer of the skin (not treated per patient request in past), hypertension and hypothyroidism, recently hospitalized for E.Coli UTI (has gotten fosfomycin in hospital and has gotten bactrim at home). Patient was brought to [facility] because of worsening mental status changes and confusion over past day or so prior to this admission.   patient was found to be hypoxic on admission with oxygen saturation of 81% on room air. This has improved with BiPAP. For this reason she required admission to stepdown unit. In addition, her potassium was 5.7 and she was given IV Lasix in ED. Troponin was within normal limits. CT head showed old bilateral basal ganglia lacunar infarcts. Chest x-ray showed pulmonary edema with bilateral pleural effusion. Her urinalysis was significant for moderate leukocytes and for this reason she was started on Primaxin.  Anticipated discharge: remains on BiPAP intermittently so we will continue to monitor in step down unit.   Assessment/Plan:    Principal Problem:  Acute metabolic encephalopathy - Slightly better this morning, more oriented. Confusion, altered mental status likely secondary to combination of underlying dementia as well as UTI.  - Continue treating for UTI with Primaxin. - Follow up on physical therapy recommendations.  Active Problems:  UTI (lower urinary tract infection) / SIRS (systemic inflammatory response syndrome) - Urine culture collected 11/22/2014 is growing Escherichia coli but urine culture on this admission is pending.  - Patient is on empiric Primaxin.  - SIRS criteria on admission: tachycardia, tachypnea, UTI as possible source of infection     Dyslipidemia - Continue Lipitor 10 mg daily and see no fibroid 160 mg daily    Essential hypertension - Patient is not on Lasix at home but we started Lasix because of finding of pulmonary edema on chest x-ray. - Since systolic blood pressure is still high in 170s will add he Trellis in for better blood pressure control   Chronic diastolic CHF (congestive heart failure) / Pleural effusion, bilateral / Acute pulmonary edema - Chest x-ray on the admission concerning for pulmonary edema.  - Patient was started on Lasix 40 mg IV daily. Continue this regimen. - Continue Plavix and statin therapy - Daily weight    COPD, severe / Acute on chronic respiratory failure with hypoxia - Patient is currently on a Ventimask but requires BiPAP intermittently.  - We will continue duoneb as needed for shortness of breath or wheezing    CKD (chronic kidney disease), stage III -  baseline creatinine is 1.13. Creatinine is within normal limits on this admission.   Hyperkalemia - Likley from potassium supplementation. Potassium on hold.  - Hyperkalemia corrected with IV Lasix.   Anemia of chronic disease - Likely secondary to anemia of chronic kidney disease.  - hemoglobin is 9.1, stable    Depression / Dementia - Continue Zoloft  - Stable    Hypothyroidism - We will continue synthroid    DVT prophylaxis:  - Continue SCD's bilaterally, plavix     Code Status: DNR/DNI Family Communication:  plan of care discussed with the patient; family not at the bedside this am  Disposition Plan: Continue to monitor in step down unit because she requires BiPAP intermittently  IV access:  Peripheral IV  Procedures and diagnostic studies:  Dg Chest 2 View 11/28/2014 Pulmonary edema with bilateral pleural effusions which have increased since the comparison study.   Ct Head Wo Contrast 11/28/2014 Old bilateral basal ganglia lacunar infarcts. Atrophy, chronic small vessel disease. No acute  findings.    Medical Consultants:  Palliative care  Other Consultants:  Physical therapy Nutrition  IAnti-Infectives:   Primaxin 11/28/2014-->   Leisa Lenz, MD  [facility] Pager [phone]  Time spent in minutes: 25 minutes  If 7PM-7AM, please contact night-coverage [email or web address] Password [facility] 11/29/2014, 8:37 AM   LOS: 1 day    HPI/Subjective: No acute overnight events. Patient reports feeling weak.  Objective: Filed Vitals:   11/29/14 0600 11/29/14 0700 11/29/14 0800 11/29/14 0809  BP: 151/30 165/40    Pulse: 63 63    Temp:   98.1 F (36.7 C)   TempSrc:   Axillary   Resp: 20 20    Height:      SpO2: 95% 96%  98%    Intake/Output Summary (Last 24 hours) at 11/29/14 0837 Last data filed at 11/29/14 0400  Gross per 24 hour  Intake    180 ml  Output      0 ml  Net    180 ml    Exam:   General:  Pt is alert, follows commands appropriately, not in acute distress  Cardiovascular: slight tachycardia, S1/S2 appreciated, appreciate SEM +3/6  Respiratory: bilateral air entry but diminished breath sounds, no wheezing    Abdomen: Soft, non tender, non distended, bowel sounds present  Extremities: trace pedal edema, pulses DP and PT palpable bilaterally  Neuro: Grossly nonfocal  Data Reviewed: Basic Metabolic Panel:  Recent Labs Lab 11/22/14 1718 11/23/14 0413 11/28/14 1345 11/29/14 0400  NA 144 146* 145 146*  K 4.2 3.8 5.7* 4.1  CL 105 110 103 100*  CO2 34* 30 37* 38*  GLUCOSE 115* 96 101* 83  BUN 19 18 13 17   CREATININE 1.13* 0.95 0.84 0.81  CALCIUM 8.9 8.5* 9.8 9.8  MG  --   --   --  1.7  PHOS  --   --   --  2.4*   Liver Function Tests:  Recent Labs Lab 11/22/14 1718 11/28/14 1345 11/29/14 0400  AST 49* 62* 41  ALT 21 23 20   ALKPHOS 48 52 49  BILITOT 0.9 1.3* 1.0  PROT 5.7* 5.4* 5.1*  ALBUMIN 2.7* 2.6* 2.6*   No results for input(s): LIPASE, AMYLASE in the last 168 hours. No results for input(s): AMMONIA in the last  168 hours. CBC:  Recent Labs Lab 11/22/14 1718 11/28/14 1345 11/29/14 0400  WBC 8.4 6.4 6.3  NEUTROABS 6.4 4.6 4.4  HGB 8.8* 9.3* 9.1*  HCT 29.2* 31.5* 29.7*  MCV 105.0* 106.4* 105.3*  PLT 161 221 202   Cardiac Enzymes:  Recent Labs Lab 11/28/14 1345  TROPONINI <0.03   BNP: Invalid input(s): POCBNP CBG:  Recent Labs Lab 11/29/14 0735  GLUCAP 74    Recent Results (from the past 240 hour(s))  Urine culture     Status: None   Collection Time: 11/22/14  5:12 PM  Result Value Ref Range Status   Specimen Description URINE, CATHETERIZED  Final   Special Requests NONE  Final   Culture   Final    >=100,000 COLONIES/mL ESCHERICHIA COLI Confirmed Extended Spectrum Beta-Lactamase Producer (ESBL) Performed at [facility]    Report Status 11/26/2014 FINAL  Final   Organism ID, Bacteria ESCHERICHIA COLI  Final  Susceptibility   Escherichia coli - MIC*    AMPICILLIN >=32 RESISTANT Resistant     CEFAZOLIN >=64 RESISTANT Resistant     CEFTRIAXONE >=64 RESISTANT Resistant     CIPROFLOXACIN >=4 RESISTANT Resistant     GENTAMICIN >=16 RESISTANT Resistant     IMIPENEM <=0.25 SENSITIVE Sensitive     NITROFURANTOIN 32 SENSITIVE Sensitive     TRIMETH/SULFA <=20 SENSITIVE Sensitive     AMPICILLIN/SULBACTAM >=32 RESISTANT Resistant     PIP/TAZO <=4 SENSITIVE Sensitive     * >=100,000 COLONIES/mL ESCHERICHIA COLI  MRSA PCR Screening     Status: None   Collection Time: 11/28/14  6:15 PM  Result Value Ref Range Status   MRSA by PCR NEGATIVE NEGATIVE Final     Scheduled Meds: . atorvastatin  10 mg Oral Daily  . betaxolol  1 drop Both Eyes BID  . cholecalciferol  400 Units Oral Daily  . clopidogrel  75 mg Oral Daily  . fenofibrate  160 mg Oral Daily  . furosemide  40 mg Intravenous Daily  . gabapentin  600 mg Oral QHS  . imipenem-cilastatin  500 mg Intravenous Q8H  . levothyroxine  125 mcg Oral QAC breakfast  . multivitamin with minerals  1 tablet Oral Daily  .  pantoprazole  40 mg Oral Daily  . sertraline  50 mg Oral Daily  . sodium chloride  3 mL Intravenous Q12H   Continuous Infusions: . sodium chloride 10 mL/hr at 11/28/14 2000

## 2014-11-29 NOTE — Progress Notes (Signed)
Initial Nutrition Assessment  DOCUMENTATION CODES:   Obesity unspecified  INTERVENTION:  - Will order Ensure Enlive BID, each supplement provides 350 kcal and 20 grams of protein - RD will continue to monitor for needs and GOC  NUTRITION DIAGNOSIS:   Predicted suboptimal nutrient intake related to acute illness, lethargy/confusion as evidenced by per patient/family report, other (see comment) (chart review).  GOAL:   Patient will meet greater than or equal to 90% of their needs  MONITOR:   PO intake, Supplement acceptance, Weight trends, Labs, Skin, I & O's  REASON FOR ASSESSMENT:   Malnutrition Screening Tool  ASSESSMENT:   81 y.o. female with a past medical history significant for dementia, chronic COPD on 4 L home oxygen, chronic diastolic heart failure, squamous cancer of the skin (not treated per patient request in past), hypertension and hypothyroidism, recently hospitalized for E.Coli UTI (has gotten fosfomycin in hospital and has gotten bactrim at home). She was brought by her son to ED with reports of worsening mental status changes, confusion over last day or os prior to this admission. No reports of abdominal pain, nausea or vomiting. No respiratory distress. No fever.   Pt seen for MST. BMI indicates obesity. Pt reports that she ate breakfast but no intakes documented in the chart. Pt with acute encephalopathy and chart notes indicate that pt was d/c'ed from the hospital Saturday (9/24) and that she has been confused and poor intakes since that time.   No muscle or fat wasting noted during physical assessment. Per chart review, pt has lost 23 lbs (11% body weight) in the past 4 months which is significant for time frame.   Palliative consult in place; will monitor for GOC. Medications reviewed. Labs reviewed; Na: 146 mmol/L, Cl: 100 mmol/L, Phos: 2.4 mg/dL.   Diet Order:  Diet regular Room service appropriate?: Yes; Fluid consistency:: Thin  Skin:  Reviewed, no  issues  Last BM:  PTA  Height:   Ht Readings from Last 1 Encounters:  11/28/14 5\' 4"  (1.626 m)    Weight:   Wt Readings from Last 1 Encounters:  11/22/14 188 lb 7.9 oz (85.5 kg)    Ideal Body Weight:  54.54 kg (kg)  BMI:  32.4 kg/m2  Estimated Nutritional Needs:   Kcal:  1300-1500  Protein:  65-75 grams  Fluid:  1.8-2 L/day  EDUCATION NEEDS:   No education needs identified at this time     Jarome Matin, RD, LDN Inpatient Clinical Dietitian Pager # [phone] After hours/weekend pager # [phone]

## 2014-11-30 ENCOUNTER — Inpatient Hospital Stay (HOSPITAL_COMMUNITY): Payer: Medicare Other

## 2014-11-30 DIAGNOSIS — E038 Other specified hypothyroidism: Secondary | ICD-10-CM

## 2014-11-30 DIAGNOSIS — A419 Sepsis, unspecified organism: Secondary | ICD-10-CM

## 2014-11-30 LAB — URINE CULTURE: CULTURE: NO GROWTH

## 2014-11-30 NOTE — Progress Notes (Addendum)
[facility] PROGRESS NOTE  Kirsten Flores UMP:[id number] DOB: 10/12/1923 DOA: 11/28/2014 PCP: Garnet Koyanagi, DO  Brief narrative:    81 y.o. female with a past medical history significant for dementia, chronic COPD on 4 L home oxygen, chronic diastolic heart failure, squamous cancer of the skin (not treated per patient request in past), hypertension and hypothyroidism, recently hospitalized for E.Coli UTI (has gotten fosfomycin in hospital and has gotten bactrim at home). Patient was brought to [facility] because of worsening mental status changes and confusion over past day or so prior to this admission.   Patient was found to be hypoxic on admission with oxygen saturation of 81% on room air. This has improved with BiPAP. For this reason she required admission to stepdown unit. In addition, her potassium was 5.7 and she was given IV Lasix in ED. Troponin was within normal limits. CT head showed old bilateral basal ganglia lacunar infarcts. Chest x-ray showed pulmonary edema with bilateral pleural effusion. Her urinalysis was significant for moderate leukocytes and for this reason she was started on Primaxin.  Anticipated discharge: off of BiPAP, transferred to the floor unit 9/29. D/C by 10/2.  Assessment/Plan:    Principal Problem:  Acute metabolic encephalopathy - Secondary to combination of underlying dementia and UTI.  - Continue Primaxin for UTI - PT evaluation pending.  - Palliative consulted for goals of care   Active Problems:  E.Coli UTI (lower urinary tract infection) / SIRS (systemic inflammatory response syndrome) - Urine culture collected 11/22/2014 is growing Escherichia coli but urine culture on this admission is pending.  - SIRS criteria on admission: tachycardia, tachypnea, UTI as possible source of infection although prelim urine culture shows no growth.    Dyslipidemia - Continue Lipitor and fenofibrate    Essential hypertension - Patient is not  on Lasix at home but Lasix started due to finding of pulmonary edema on chest x-ray. - Continue lasix 60 mg daily   Chronic diastolic CHF (congestive heart failure) / Pleural effusion, bilateral / Acute pulmonary edema - Chest x-ray on the admission showed pulmonary edema. Repeat CXR today. - Given IV lasix 40 mg on admission. Now on lasix 60 mg PO daily.  - Continue Plavix and Lipitor    COPD, severe / Acute on chronic respiratory failure with hypoxia - Off of BiPAP - Continue oxygen via  to keep O2 sats above 90% - Stable respiratory status    CKD (chronic kidney disease), stage III - Baseline creatinine 1.13.  - Creatinine within normal limits on this admission.   Hyperkalemia - Likley from potassium supplements. - Hyperkalemia corrected with Lasix.   Anemia of chronic disease - Secondary to anemia of chronic kidney disease.  - Stable - No reports of bleed.    Depression / Dementia - Continue Zoloft  - Stable, not depressed    Hypothyroidism - Continue synthroid    DVT prophylaxis:  - SCD's, plavix     Code Status: DNR/DNI Family Communication:  Family not at the bedside this am  Disposition Plan: D/C by 12/02/2014.   IV access:  Peripheral IV  Procedures and diagnostic studies:     Dg Chest 2 View 11/28/2014 Pulmonary edema with bilateral pleural effusions which have increased since the comparison study.   Ct Head Wo Contrast 11/28/2014 Old bilateral basal ganglia lacunar infarcts. Atrophy, chronic small vessel disease. No acute findings.    Medical Consultants:  Palliative care  Other Consultants:  Physical therapy Nutrition  IAnti-Infectives:   Primaxin  11/28/2014-->   Leisa Lenz, MD  [facility] Pager [phone]  Time spent in minutes: 25 minutes  If 7PM-7AM, please contact night-coverage [email or web address] Password [email or web address] 11/30/2014, 2:07 PM   LOS: 2 days    HPI/Subjective: No acute overnight events. Patient reports  feeling tired.   Objective: Filed Vitals:   11/29/14 1530 11/29/14 1800 11/29/14 2140 11/30/14 0518  BP: 136/35  119/33 119/36  Pulse: 75 78 67 62  Temp:   97.7 F (36.5 C) 97.7 F (36.5 C)  TempSrc:   Oral Oral  Resp: 23 33 16 16  Height:      Weight:      SpO2: 93% 91% 98% 95%    Intake/Output Summary (Last 24 hours) at 11/30/14 1407 Last data filed at 11/30/14 1010  Gross per 24 hour  Intake    643 ml  Output     75 ml  Net    568 ml    Exam:   General:  Pt is sleeping, wakes up easily when called her name, not in acute distress  Cardiovascular: Appreciate SEM +3/6, rate controlled  Respiratory: no wheezing, diminished breath sounds   Abdomen: non tender, (+) BS  Extremities: trace LE edema, palpable pulses   Neuro: No focal deficits   Data Reviewed: Basic Metabolic Panel:  Recent Labs Lab 11/28/14 1345 11/29/14 0400  NA 145 146*  K 5.7* 4.1  CL 103 100*  CO2 37* 38*  GLUCOSE 101* 83  BUN 13 17  CREATININE 0.84 0.81  CALCIUM 9.8 9.8  MG  --  1.7  PHOS  --  2.4*   Liver Function Tests:  Recent Labs Lab 11/28/14 1345 11/29/14 0400  AST 62* 41  ALT 23 20  ALKPHOS 52 49  BILITOT 1.3* 1.0  PROT 5.4* 5.1*  ALBUMIN 2.6* 2.6*   No results for input(s): LIPASE, AMYLASE in the last 168 hours. No results for input(s): AMMONIA in the last 168 hours. CBC:  Recent Labs Lab 11/28/14 1345 11/29/14 0400  WBC 6.4 6.3  NEUTROABS 4.6 4.4  HGB 9.3* 9.1*  HCT 31.5* 29.7*  MCV 106.4* 105.3*  PLT 221 202   Cardiac Enzymes:  Recent Labs Lab 11/28/14 1345  TROPONINI <0.03   BNP: Invalid input(s): POCBNP CBG:  Recent Labs Lab 11/29/14 0735  GLUCAP 74    Recent Results (from the past 240 hour(s))  Urine culture     Status: None   Collection Time: 11/22/14  5:12 PM  Result Value Ref Range Status   Specimen Description URINE, CATHETERIZED  Final   Special Requests NONE  Final   Culture   Final    >=100,000 COLONIES/mL ESCHERICHIA  COLI Confirmed Extended Spectrum Beta-Lactamase Producer (ESBL) Performed at [facility]    Report Status 11/26/2014 FINAL  Final   Organism ID, Bacteria ESCHERICHIA COLI  Final      Susceptibility   Escherichia coli - MIC*    AMPICILLIN >=32 RESISTANT Resistant     CEFAZOLIN >=64 RESISTANT Resistant     CEFTRIAXONE >=64 RESISTANT Resistant     CIPROFLOXACIN >=4 RESISTANT Resistant     GENTAMICIN >=16 RESISTANT Resistant     IMIPENEM <=0.25 SENSITIVE Sensitive     NITROFURANTOIN 32 SENSITIVE Sensitive     TRIMETH/SULFA <=20 SENSITIVE Sensitive     AMPICILLIN/SULBACTAM >=32 RESISTANT Resistant     PIP/TAZO <=4 SENSITIVE Sensitive     * >=100,000 COLONIES/mL ESCHERICHIA COLI  MRSA PCR Screening     Status: None  Collection Time: 11/28/14  6:15 PM  Result Value Ref Range Status   MRSA by PCR NEGATIVE NEGATIVE Final     Scheduled Meds: . betaxolol  1 drop Both Eyes BID  . clopidogrel  75 mg Oral Daily  . feeding supplement (ENSURE ENLIVE)  237 mL Oral BID BM  . furosemide  60 mg Oral Daily  . gabapentin  600 mg Oral QHS  . imipenem-cilastatin  500 mg Intravenous Q8H  . levothyroxine  125 mcg Oral QAC breakfast  . pantoprazole  40 mg Oral Daily  . senna-docusate  1 tablet Oral QHS  . sertraline  50 mg Oral Daily  . sodium chloride  3 mL Intravenous Q12H   Continuous Infusions: . sodium chloride 10 mL/hr at 11/28/14 2000

## 2014-11-30 NOTE — Progress Notes (Signed)
Patient arrive to room via bed with tech and son. No complaints of pain, vitals stable patient oriented to unit. Patient resting comfortable in bed with call bell in arms reach

## 2014-11-30 NOTE — Progress Notes (Addendum)
CM consult to offer choice for home hospice services. This CM went to pt room to discuss disposition. Pt very drowsy and falls asleep during conversation. I then called pt's son who states that "everyone is saying she needs hospice so I guess that's what we have to do". Son states that he is at work but plans to come to the hospital early this afternoon. Home hospice provider list left in pt room for son to look over. RN alerted to call CM when son arrives. CM will await son to arrive at hospital to discuss hospice choice. Marney Doctor RN,BSN,NCM [phone]  Addendum 11/30/14 1614: This CM will email weekend CM to follow up with pt and family for home hospice choice.

## 2014-11-30 NOTE — Progress Notes (Signed)
PT Cancellation Note  Patient Details Name: VERDELLE VALTIERRA MRN: [id number] DOB: [birth year]   Cancelled Treatment:    Reason Eval/Treat Not Completed: PT screened, no needs identified, will sign off, RN reports that patient requires total care and is going home with Hospice.   Claretha Cooper 11/30/2014, 12:48 PM Tresa Endo PT [phone]

## 2014-12-01 DIAGNOSIS — R6510 Systemic inflammatory response syndrome (SIRS) of non-infectious origin without acute organ dysfunction: Secondary | ICD-10-CM

## 2014-12-01 NOTE — Progress Notes (Addendum)
[facility] PROGRESS NOTE  Kirsten Flores QIH:[id number] DOB: 1924/02/10 DOA: 11/28/2014 PCP: Garnet Koyanagi, DO  Brief narrative:    81 y.o. female with a past medical history significant for dementia, chronic COPD on 4 L home oxygen, chronic diastolic heart failure, squamous cancer of the skin (not treated per patient request in past), hypertension and hypothyroidism, recently hospitalized for E.Coli UTI (has gotten fosfomycin in hospital and has gotten bactrim at home). Patient was brought to [facility] because of worsening mental status changes and confusion over past day or so prior to this admission.   Patient was found to be hypoxic on admission with oxygen saturation of 81% on room air. This has improved with BiPAP. For this reason she required admission to stepdown unit. In addition, her potassium was 5.7 and she was given IV Lasix in ED. Troponin was within normal limits. CT head showed old bilateral basal ganglia lacunar infarcts. Chest x-ray showed pulmonary edema with bilateral pleural effusion. Her urinalysis was significant for moderate leukocytes and for this reason she was started on Primaxin.  Pt initially in SDU, transferred to telemetry floor 2014-12-01.  Anticipated discharge: D/C by 10/2.  Assessment/Plan:    Principal Problem:  Acute metabolic encephalopathy - Secondary to dementia and UTI.  - Urine culture so far show no growth. She was started on Primaxin but will stop it today and observe for next 24 hours while off of abx - PT evaluation  - Palliative consulted for goals of care   Active Problems:  E.Coli UTI (lower urinary tract infection) / SIRS (systemic inflammatory response syndrome) - Urine culture on this admission showed no growth. Stop primaxin today.    Dyslipidemia - We will continue Lipitor and fenofibrate    Essential hypertension - Started lasix for pulmonary edema seen on chest x-ray. - Continue lasix 60 mg daily - BP  111/77   Chronic diastolic CHF (congestive heart failure) / Pleural effusion, bilateral / Acute pulmonary edema - Chest x-ray demonstrated pulmonary edema. It was repeated 01-Dec-2022 and showed moderate CHF. - Given IV lasix 40 mg on admission. Now takes lasix 60 mg PO daily.  - We will continue Plavix and Lipitor    COPD, severe / Acute on chronic respiratory failure with hypoxia - Off of BiPAP - Resp status stable    CKD (chronic kidney disease), stage III - Baseline creatinine 1.13.  - Creatinine within normal limits at this time - Monitor BMP while on lasix    Hyperkalemia - Likley from supplementation  - Hyperkalemia corrected with Lasix.   Anemia of chronic disease - Secondary to chronic kidney disease.  - Stable   Depression / Dementia - Continue Zoloft  - Stable   Hypothyroidism - We will continue synthroid    DVT prophylaxis:  - SCD's and plavix     Code Status: DNR/DNI Family Communication:  Family not at the bedside this am  Disposition Plan: D/C by 12/02/2014.   IV access:  Peripheral IV  Procedures and diagnostic studies:    Dg Chest Port 1 View 12-01-14  1. Moderate CHF.   Electronically Signed   By: Kerby Moors M.D.   On: 12-01-2014 14:48   Dg Chest 2 View 11/28/2014 Pulmonary edema with bilateral pleural effusions which have increased since the comparison study.   Ct Head Wo Contrast 11/28/2014 Old bilateral basal ganglia lacunar infarcts. Atrophy, chronic small vessel disease. No acute findings.    Medical Consultants:  Palliative care  Other Consultants:  Physical therapy Nutrition  IAnti-Infectives:   Primaxin 11/28/2014--> 12/01/2014   Leisa Lenz, MD  [facility] Pager [phone]  Time spent in minutes: 25 minutes  If 7PM-7AM, please contact night-coverage [email or web address] Password [email or web address] 12/01/2014, 4:20 PM   LOS: 3 days    HPI/Subjective: No acute overnight events. Patient reports feeling better this am.    Objective: Filed Vitals:   11/30/14 2058 11/30/14 2114 12/01/14 0522 12/01/14 1320  BP: 129/34  118/35 111/77  Pulse: 67 68 70 80  Temp: 97.7 F (36.5 C)  97.7 F (36.5 C) 98.6 F (37 C)  TempSrc: Oral  Oral Oral  Resp: 16  16 20   Height:      Weight:   82.509 kg (181 lb 14.4 oz)   SpO2: 100%  95% 92%    Intake/Output Summary (Last 24 hours) at 12/01/14 1620 Last data filed at 12/01/14 1447  Gross per 24 hour  Intake 1860.83 ml  Output   2000 ml  Net -139.17 ml    Exam:   General:  Pt is awake, alert,  not in acute distress  Cardiovascular: SEM +3/6 appreciated, regular rhythm  Respiratory: bilateral air entry, no wheezing   Abdomen: appreciate bowel sounds, non tender abdomen   Extremities: trace LE swelling, palpable pulses, non tenderness   Neuro: Nonfocal   Data Reviewed: Basic Metabolic Panel:  Recent Labs Lab 11/28/14 1345 11/29/14 0400  NA 145 146*  K 5.7* 4.1  CL 103 100*  CO2 37* 38*  GLUCOSE 101* 83  BUN 13 17  CREATININE 0.84 0.81  CALCIUM 9.8 9.8  MG  --  1.7  PHOS  --  2.4*   Liver Function Tests:  Recent Labs Lab 11/28/14 1345 11/29/14 0400  AST 62* 41  ALT 23 20  ALKPHOS 52 49  BILITOT 1.3* 1.0  PROT 5.4* 5.1*  ALBUMIN 2.6* 2.6*   No results for input(s): LIPASE, AMYLASE in the last 168 hours. No results for input(s): AMMONIA in the last 168 hours. CBC:  Recent Labs Lab 11/28/14 1345 11/29/14 0400  WBC 6.4 6.3  NEUTROABS 4.6 4.4  HGB 9.3* 9.1*  HCT 31.5* 29.7*  MCV 106.4* 105.3*  PLT 221 202   Cardiac Enzymes:  Recent Labs Lab 11/28/14 1345  TROPONINI <0.03   BNP: Invalid input(s): POCBNP CBG:  Recent Labs Lab 11/29/14 0735  GLUCAP 74    Results for orders placed or performed during the hospital encounter of 11/28/14  Urine culture     Status: None   Collection Time: 11/28/14  2:51 PM  Result Value Ref Range Status   Specimen Description URINE, CATHETERIZED  Final   Special Requests NONE   Final   Culture   Final    NO GROWTH 1 DAY Performed at [facility]    Report Status 11/29/2014 FINAL  Final  MRSA PCR Screening     Status: None   Collection Time: 11/28/14  6:15 PM  Result Value Ref Range Status   MRSA by PCR NEGATIVE NEGATIVE Final    Comment:        The GeneXpert MRSA Assay (FDA approved for NASAL specimens only), is one component of a comprehensive MRSA colonization surveillance program. It is not intended to diagnose MRSA infection nor to guide or monitor treatment for MRSA infections.   Urine culture     Status: None   Collection Time: 11/29/14  3:26 PM  Result Value Ref Range Status   Specimen Description URINE, CLEAN CATCH  Final   Special Requests NONE  Final   Culture   Final    NO GROWTH 1 DAY Performed at [facility]    Report Status 11/30/2014 FINAL  Final     Scheduled Meds: . clopidogrel  75 mg Oral Daily  . feeding supplement (ENSURE ENLIVE)  237 mL Oral BID BM  . furosemide  60 mg Oral Daily  . gabapentin  600 mg Oral QHS  . imipenem-cilastatin  500 mg Intravenous Q8H  . levothyroxine  125 mcg Oral QAC breakfast  . pantoprazole  40 mg Oral Daily  . senna-docusate  1 tablet Oral QHS  . sertraline  50 mg Oral Daily   Continuous Infusions: . sodium chloride 10 mL/hr at 12/01/14 0235

## 2014-12-01 NOTE — Care Management Note (Addendum)
Case Management Note  Patient Details  Name: Kirsten Flores MRN: [id number] Date of Birth: 05-05-1923  Subjective/Objective:       chronic COPD on 4 L home oxygen, chronic diastolic heart failure, squamous cancer of the skin              Action/Plan: Home Health RN vs Hospice  Expected Discharge Date:  12/02/2014               Expected Discharge Plan:  Home w Hospice Care  In-House Referral:  NA  Discharge planning Services  CM Consult  Post Acute Care Choice:  Hospice Choice offered to:  Adult Children   HH Arranged:  RN [place] Agency:  [facility]  Status of Service:  Completed, signed off  Medicare Important Message Given:  Yes-second notification given Date Medicare IM Given:    Medicare IM give by:    Date Additional Medicare IM Given:    Additional Medicare Important Message give by:     If discussed at [place] of Stay Meetings, dates discussed:    Additional Comments: NCM spoke to pt and gave permission to speak to dtr, Kirsten Flores  # [phone] and son, Kirsten Flores. Kirsten Flores states pt has RW, hospital bed, cane, oxygen and bedside commode at home. Pt is active with [facility]. Offered choice for Hospice. Son is requesting [facility]. Pt states she works well with HHPT/OT and would like to continue with therapy at home. Family is trying to decide dc on [facility] or Hospice. Spoke to dtr, Kirsten Flores and she wants to speak to attending, Dr Charlies Silvers prior to dc. Will leave message for attending. Waiting final decision from family about services for home.   Erenest Rasher, RN 12/01/2014, 6:39 PM

## 2014-12-01 NOTE — Care Management Important Message (Signed)
Important Message  Patient Details  Name: Kirsten Flores MRN: [id number] Date of Birth: 1923-03-16   Medicare Important Message Given:  Yes-second notification given    Erenest Rasher, RN 12/01/2014, 11:37 AM

## 2014-12-02 LAB — CBC
HEMATOCRIT: 27.2 % — AB (ref 36.0–46.0)
HEMOGLOBIN: 8.2 g/dL — AB (ref 12.0–15.0)
MCH: 31.7 pg (ref 26.0–34.0)
MCHC: 30.1 g/dL (ref 30.0–36.0)
MCV: 105 fL — AB (ref 78.0–100.0)
Platelets: 148 10*3/uL — ABNORMAL LOW (ref 150–400)
RBC: 2.59 MIL/uL — ABNORMAL LOW (ref 3.87–5.11)
RDW: 15.6 % — AB (ref 11.5–15.5)
WBC: 4.8 10*3/uL (ref 4.0–10.5)

## 2014-12-02 LAB — BASIC METABOLIC PANEL
ANION GAP: 4 — AB (ref 5–15)
BUN: 23 mg/dL — ABNORMAL HIGH (ref 6–20)
CALCIUM: 8.9 mg/dL (ref 8.9–10.3)
CHLORIDE: 90 mmol/L — AB (ref 101–111)
CO2: 49 mmol/L — AB (ref 22–32)
Creatinine, Ser: 0.87 mg/dL (ref 0.44–1.00)
GFR calc Af Amer: >60 mL/min (ref >60)
GFR calc non Af Amer: 57 mL/min — ABNORMAL LOW (ref >60)
GLUCOSE: 93 mg/dL (ref 65–99)
POTASSIUM: 3.1 mmol/L — AB (ref 3.5–5.1)
Sodium: 143 mmol/L (ref 135–145)

## 2014-12-02 MED ORDER — POTASSIUM CHLORIDE CRYS ER 20 MEQ PO TBCR: 40.0000 meq | EXTENDED_RELEASE_TABLET | Freq: Once | ORAL | Status: DC

## 2014-12-02 MED ORDER — POTASSIUM CHLORIDE CRYS ER 20 MEQ PO TBCR
40.0000 meq | EXTENDED_RELEASE_TABLET | Freq: Once | ORAL | Status: AC
  Administered 2014-12-02: 40 meq via ORAL
  Filled 2014-12-02: qty 2

## 2014-12-02 NOTE — Progress Notes (Signed)
[facility] PROGRESS NOTE  Kirsten Flores KWI:[id number] DOB: 1924/03/01 DOA: 11/28/2014 PCP: Garnet Koyanagi, DO  Brief narrative:    81 y.o. female with a past medical history significant for dementia, chronic COPD on 4 L home oxygen, chronic diastolic heart failure, squamous cancer of the skin (not treated per patient request in past), hypertension and hypothyroidism, recently hospitalized for E.Coli UTI (has gotten fosfomycin in hospital and has gotten bactrim at home). Patient was brought to [facility] because of worsening mental status changes and confusion over past day or so prior to this admission.   Patient was found to be hypoxic on admission with oxygen saturation of 81% on room air. This has improved with BiPAP. For this reason she required admission to stepdown unit. In addition, her potassium was 5.7 and she was given IV Lasix in ED. Troponin was within normal limits. CT head showed old bilateral basal ganglia lacunar infarcts. Chest x-ray showed pulmonary edema with bilateral pleural effusion. Her urinalysis was significant for moderate leukocytes and for this reason she was started on Primaxin.  Pt initially in SDU, transferred to medical floor 11/30/14. Urine culture showed no growth so Primaxin stopped 10/1.   Anticipated discharge: Patient's son did not agree with hospice. Order placed for [facility] RN, PT, aide. Discharge anticipated for 12/03/2014.  Assessment/Plan:    Principal Problem:  Acute metabolic encephalopathy - Secondary to combination of dementia and UTI.  - Urine culture showed no growth so we stopped Primaxin 10/1. - Patient and her son did not want her to go to SNF. They will take her home once medically stable. - Will monitor for next 24 horus to make sure no further changes in mental status and that respiratory status remains stable. - HH orders placed.  - Appreciate palliative care consultation   Active Problems:  E.Coli UTI (lower urinary tract  infection) / SIRS (systemic inflammatory response syndrome) - Urine culture showed no growth. - Primaxin stopped 10/1.   Dyslipidemia - Continue Lipitor and fenofibrate    Essential hypertension - Started lasix for pulmonary edema which was seen on admission CXR - She apparently is on lasix at home but this was not documented in EPIC - She is currently on lasix 60 mg daily - Renal function stable - Weight trend since admission: 82.9 kg --> 82.5 kg   Chronic diastolic CHF (congestive heart failure) / Pleural effusion, bilateral / Acute pulmonary edema - Chest x-ray showed pulmonary edema. CXR repeated 9/30 and showed moderate CHF. - Continue lasix - Continue daily weight and intake and output monitoring - Continue Plavix and Lipitor    COPD, severe / Acute on chronic respiratory failure with hypoxia - Off of BiPAP   CKD (chronic kidney disease), stage III - Baseline creatinine 1.13.  - Creatinine WNL    Hyperkalemia - Likley from potassium supplementation.  - Resolved with lasix.    Anemia of chronic disease - Secondary to chronic kidney disease.  - Hemoglobin stable at 8.2 - No reports of bleeding    Depression / Dementia - Continue Zoloft    Hypothyroidism - Continue synthroid    DVT prophylaxis:  - Continue SCD's and plavix     Code Status: DNR/DNI Family Communication:  Family not at the bedside this am; spoke with her son over the phone for 35 minutes to provide details on her plan of care  Disposition Plan: D/C 12/03/2014.  IV access:  Peripheral IV  Procedures and diagnostic studies:    Dg  Chest Port 1 View 11/30/2014  1. Moderate CHF.   Electronically Signed   By: Kerby Moors M.D.   On: 11/30/2014 14:48   Dg Chest 2 View 11/28/2014 Pulmonary edema with bilateral pleural effusions which have increased since the comparison study.   Ct Head Wo Contrast 11/28/2014 Old bilateral basal ganglia lacunar infarcts. Atrophy, chronic small vessel  disease. No acute findings.    Medical Consultants:  Palliative care  Other Consultants:  Physical therapy Nutrition  IAnti-Infectives:   Primaxin 11/28/2014--> 12/01/2014   Leisa Lenz, MD  [facility] Pager [phone]  Time spent in minutes: 15 minutes  If 7PM-7AM, please contact night-coverage [email or web address] Password [email or web address] 12/02/2014, 10:56 AM   LOS: 4 days    HPI/Subjective: No acute overnight events. Patient reports some shortness of breath but no distress.  Objective: Filed Vitals:   12/01/14 0522 12/01/14 1320 12/01/14 2115 12/02/14 0511  BP: 118/35 111/77 131/39 116/61  Pulse: 70 80 97 60  Temp: 97.7 F (36.5 C) 98.6 F (37 C) 98.4 F (36.9 C) 98.4 F (36.9 C)  TempSrc: Oral Oral Oral Oral  Resp: 16 20 18 16   Height:      Weight: 82.509 kg (181 lb 14.4 oz)     SpO2: 95% 92% 94% 97%    Intake/Output Summary (Last 24 hours) at 12/02/14 1056 Last data filed at 12/02/14 0511  Gross per 24 hour  Intake 1641.03 ml  Output   1575 ml  Net  66.03 ml    Exam:   General:  Pt is awake, no acute distress   Cardiovascular: SEM +3/6 appreciated, rate controlled   Respiratory: clear to ausculation bilaterally, no wheezing   Abdomen: non tender abdomen, non distended, (+) BS   Extremities: trace edema, palpable pulses   Neuro: No focal neuro deficits   Data Reviewed: Basic Metabolic Panel:  Recent Labs Lab 11/28/14 1345 11/29/14 0400 12/02/14 0407  NA 145 146* 143  K 5.7* 4.1 3.1*  CL 103 100* 90*  CO2 37* 38* 49*  GLUCOSE 101* 83 93  BUN 13 17 23*  CREATININE 0.84 0.81 0.87  CALCIUM 9.8 9.8 8.9  MG  --  1.7  --   PHOS  --  2.4*  --    Liver Function Tests:  Recent Labs Lab 11/28/14 1345 11/29/14 0400  AST 62* 41  ALT 23 20  ALKPHOS 52 49  BILITOT 1.3* 1.0  PROT 5.4* 5.1*  ALBUMIN 2.6* 2.6*   No results for input(s): LIPASE, AMYLASE in the last 168 hours. No results for input(s): AMMONIA in the last 168  hours. CBC:  Recent Labs Lab 11/28/14 1345 11/29/14 0400 12/02/14 0407  WBC 6.4 6.3 4.8  NEUTROABS 4.6 4.4  --   HGB 9.3* 9.1* 8.2*  HCT 31.5* 29.7* 27.2*  MCV 106.4* 105.3* 105.0*  PLT 221 202 148*   Cardiac Enzymes:  Recent Labs Lab 11/28/14 1345  TROPONINI <0.03   BNP: Invalid input(s): POCBNP CBG:  Recent Labs Lab 11/29/14 0735  GLUCAP 74    Results for orders placed or performed during the hospital encounter of 11/28/14  Urine culture     Status: None   Collection Time: 11/28/14  2:51 PM  Result Value Ref Range Status   Specimen Description URINE, CATHETERIZED  Final   Special Requests NONE  Final   Culture   Final    NO GROWTH 1 DAY Performed at [facility]    Report Status 11/29/2014 FINAL  Final  MRSA PCR Screening     Status: None   Collection Time: 11/28/14  6:15 PM  Result Value Ref Range Status   MRSA by PCR NEGATIVE NEGATIVE Final  Urine culture     Status: None   Collection Time: 11/29/14  3:26 PM  Result Value Ref Range Status   Specimen Description URINE, CLEAN CATCH  Final   Special Requests NONE  Final   Culture   Final    NO GROWTH 1 DAY Performed at [facility]    Report Status 11/30/2014 FINAL  Final     Scheduled Meds: . clopidogrel  75 mg Oral Daily  . feeding supplement (ENSURE ENLIVE)  237 mL Oral BID BM  . furosemide  60 mg Oral Daily  . gabapentin  600 mg Oral QHS  . imipenem-cilastatin  500 mg Intravenous Q8H  . levothyroxine  125 mcg Oral QAC breakfast  . pantoprazole  40 mg Oral Daily  . senna-docusate  1 tablet Oral QHS  . sertraline  50 mg Oral Daily   Continuous Infusions: . sodium chloride 10 mL/hr at 12/01/14 0235

## 2014-12-03 ENCOUNTER — Other Ambulatory Visit: Payer: Self-pay | Admitting: Family Medicine

## 2014-12-03 DIAGNOSIS — N390 Urinary tract infection, site not specified: Secondary | ICD-10-CM

## 2014-12-03 LAB — BASIC METABOLIC PANEL
Anion gap: 4 — ABNORMAL LOW (ref 5–15)
BUN: 27 mg/dL — AB (ref 6–20)
CO2: 48 mmol/L — AB (ref 22–32)
Calcium: 9 mg/dL (ref 8.9–10.3)
Chloride: 89 mmol/L — ABNORMAL LOW (ref 101–111)
Creatinine, Ser: 0.86 mg/dL (ref 0.44–1.00)
GFR calc Af Amer: >60 mL/min (ref >60)
GFR, EST NON AFRICAN AMERICAN: 58 mL/min — AB (ref >60)
GLUCOSE: 93 mg/dL (ref 65–99)
POTASSIUM: 3.7 mmol/L (ref 3.5–5.1)
Sodium: 141 mmol/L (ref 135–145)

## 2014-12-03 LAB — CBC
HCT: 29.1 % — ABNORMAL LOW (ref 36.0–46.0)
Hemoglobin: 8.7 g/dL — ABNORMAL LOW (ref 12.0–15.0)
MCH: 30.9 pg (ref 26.0–34.0)
MCHC: 29.9 g/dL — ABNORMAL LOW (ref 30.0–36.0)
MCV: 103.2 fL — AB (ref 78.0–100.0)
PLATELETS: 143 10*3/uL — AB (ref 150–400)
RBC: 2.82 MIL/uL — AB (ref 3.87–5.11)
RDW: 15.4 % (ref 11.5–15.5)
WBC: 4.8 10*3/uL (ref 4.0–10.5)

## 2014-12-03 MED ORDER — ACETAMINOPHEN 325 MG PO TABS: 650.0000 mg | ORAL_TABLET | Freq: Four times a day (QID) | ORAL | Status: AC | PRN

## 2014-12-03 MED ORDER — NITROFURANTOIN MONOHYD MACRO 100 MG PO CAPS: 100.0000 mg | ORAL_CAPSULE | Freq: Two times a day (BID) | ORAL | Status: AC

## 2014-12-03 MED ORDER — ENSURE ENLIVE PO LIQD: 237.0000 mL | Freq: Two times a day (BID) | ORAL | Status: AC

## 2014-12-03 MED ORDER — FUROSEMIDE 40 MG PO TABS: 40.0000 mg | ORAL_TABLET | Freq: Every day | ORAL | Status: DC

## 2014-12-03 NOTE — Progress Notes (Signed)
Per pt discussions with MD and RN pt does not want Hospice and would like to go home with [facility] services. [facility] contacted to continue services at [facility]. HH orders placed. No other needs identified. Marney Doctor RN,BSN,NCM [phone]

## 2014-12-03 NOTE — Discharge Instructions (Signed)
Altered Mental Status Altered mental status most often refers to an abnormal change in your responsiveness and awareness. It can affect your speech, thought, mobility, memory, attention span, or alertness. It can range from slight confusion to complete unresponsiveness (coma). Altered mental status can be a sign of a serious underlying medical condition. Rapid evaluation and medical treatment is necessary for patients having an altered mental status. CAUSES   Low blood sugar (hypoglycemia) or diabetes.  Severe loss of body fluids (dehydration) or a body salt (electrolyte) imbalance.  A stroke or other neurologic problem, such as dementia or delirium.  A head injury or tumor.  A drug or alcohol overdose.  Exposure to toxins or poisons.  Depression, anxiety, and stress.  A low oxygen level (hypoxia).  An infection.  Blood loss.  Twitching or shaking (seizure).  Heart problems, such as heart attack or heart rhythm problems (arrhythmias).  A body temperature that is too low or too high (hypothermia or hyperthermia). DIAGNOSIS  A diagnosis is based on your history, symptoms, physical and neurologic examinations, and diagnostic tests. Diagnostic tests may include:  Measurement of your blood pressure, pulse, breathing, and oxygen levels (vital signs).  Blood tests.  Urine tests.  X-ray exams.  A computerized magnetic scan (magnetic resonance imaging, MRI).  A computerized X-ray scan (computed tomography, CT scan). TREATMENT  Treatment will depend on the cause. Treatment may include:  Management of an underlying medical or mental health condition.  Critical care or support in the hospital. [place]   Only take over-the-counter or prescription medicines for pain, discomfort, or fever as directed by your caregiver.  Manage underlying conditions as directed by your caregiver.  Eat a healthy, well-balanced diet to maintain strength.  Join a support group or  prevention program to cope with the condition or trauma that caused the altered mental status. Ask your caregiver to help choose a program that works for you.  Follow up with your caregiver for further examination, therapy, or testing as directed. SEEK MEDICAL CARE IF:   You feel unwell or have chills.  You or your family notice a change in your behavior or your alertness.  You have trouble following your caregiver's treatment plan.  You have questions or concerns. SEEK IMMEDIATE MEDICAL CARE IF:   You have a rapid heartbeat or have chest pain.  You have difficulty breathing.  You have a fever.  You have a headache with a stiff neck.  You cough up blood.  You have blood in your urine or stool.  You have severe agitation or confusion. MAKE SURE YOU:   Understand these instructions.  Will watch your condition.  Will get help right away if you are not doing well or get worse. Document Released: 08/06/2009 Document Revised: 05/11/2011 Document Reviewed: 08/06/2009 [facility] Patient Information 2015 [place], [place]. This information is not intended to replace advice given to you by your health care provider. Make sure you discuss any questions you have with your health care provider. Urinary Tract Infection Urinary tract infections (UTIs) can develop anywhere along your urinary tract. Your urinary tract is your body's drainage system for removing wastes and extra water. Your urinary tract includes two kidneys, two ureters, a bladder, and a urethra. Your kidneys are a pair of bean-shaped organs. Each kidney is about the size of your fist. They are located below your ribs, one on each side of your spine. CAUSES Infections are caused by microbes, which are microscopic organisms, including fungi, viruses, and bacteria. These  organisms are so small that they can only be seen through a microscope. Bacteria are the microbes that most commonly cause UTIs. SYMPTOMS  Symptoms of UTIs may  vary by age and gender of the patient and by the location of the infection. Symptoms in young women typically include a frequent and intense urge to urinate and a painful, burning feeling in the bladder or urethra during urination. Older women and men are more likely to be tired, shaky, and weak and have muscle aches and abdominal pain. A fever may mean the infection is in your kidneys. Other symptoms of a kidney infection include pain in your back or sides below the ribs, nausea, and vomiting. DIAGNOSIS To diagnose a UTI, your caregiver will ask you about your symptoms. Your caregiver also will ask to provide a urine sample. The urine sample will be tested for bacteria and white blood cells. White blood cells are made by your body to help fight infection. TREATMENT  Typically, UTIs can be treated with medication. Because most UTIs are caused by a bacterial infection, they usually can be treated with the use of antibiotics. The choice of antibiotic and length of treatment depend on your symptoms and the type of bacteria causing your infection. HOME CARE INSTRUCTIONS  If you were prescribed antibiotics, take them exactly as your caregiver instructs you. Finish the medication even if you feel better after you have only taken some of the medication.  Drink enough water and fluids to keep your urine clear or pale yellow.  Avoid caffeine, tea, and carbonated beverages. They tend to irritate your bladder.  Empty your bladder often. Avoid holding urine for long periods of time.  Empty your bladder before and after sexual intercourse.  After a bowel movement, women should cleanse from front to back. Use each tissue only once. SEEK MEDICAL CARE IF:   You have back pain.  You develop a fever.  Your symptoms do not begin to resolve within 3 days. SEEK IMMEDIATE MEDICAL CARE IF:   You have severe back pain or lower abdominal pain.  You develop chills.  You have nausea or vomiting.  You have  continued burning or discomfort with urination. MAKE SURE YOU:   Understand these instructions.  Will watch your condition.  Will get help right away if you are not doing well or get worse. Document Released: 11/26/2004 Document Revised: 08/18/2011 Document Reviewed: 03/27/2011 [facility] Patient Information 2015 [place], [place]. This information is not intended to replace advice given to you by your health care provider. Make sure you discuss any questions you have with your health care provider. Nitrofurantoin tablets or capsules What is this medicine? NITROFURANTOIN (nye troe fyoor AN toyn) is an antibiotic. It is used to treat urinary tract infections. This medicine may be used for other purposes; ask your health care provider or pharmacist if you have questions. COMMON BRAND NAME(S): Macrobid, Macrodantin, Urotoin What should I tell my health care provider before I take this medicine? They need to know if you have any of these conditions: -anemia -diabetes -glucose-6-phosphate dehydrogenase deficiency -kidney disease -liver disease -lung disease -other chronic illness -an unusual or allergic reaction to nitrofurantoin, other antibiotics, other medicines, foods, dyes or preservatives -pregnant or trying to get pregnant -breast-feeding How should I use this medicine? Take this medicine by mouth with a glass of water. Follow the directions on the prescription label. Take this medicine with food or milk. Take your doses at regular intervals. Do not take your medicine more often  than directed. Do not stop taking except on your doctor's advice. Talk to your pediatrician regarding the use of this medicine in children. While this drug may be prescribed for selected conditions, precautions do apply. Overdosage: If you think you have taken too much of this medicine contact a poison control center or emergency room at once. NOTE: This medicine is only for you. Do not share this medicine with  others. What if I miss a dose? If you miss a dose, take it as soon as you can. If it is almost time for your next dose, take only that dose. Do not take double or extra doses. What may interact with this medicine? -antacids containing magnesium trisilicate -probenecid -quinolone antibiotics like ciprofloxacin, lomefloxacin, norfloxacin and ofloxacin -sulfinpyrazone This list may not describe all possible interactions. Give your health care provider a list of all the medicines, herbs, non-prescription drugs, or dietary supplements you use. Also tell them if you smoke, drink alcohol, or use illegal drugs. Some items may interact with your medicine. What should I watch for while using this medicine? Tell your doctor or health care professional if your symptoms do not improve or if you get new symptoms. Drink several glasses of water a day. If you are taking this medicine for a long time, visit your doctor for regular checks on your progress. If you are diabetic, you may get a false positive result for sugar in your urine with certain brands of urine tests. Check with your doctor. What side effects may I notice from receiving this medicine? Side effects that you should report to your doctor or health care professional as soon as possible: -allergic reactions like skin rash or hives, swelling of the face, lips, or tongue -chest pain -cough -difficulty breathing -dizziness, drowsiness -fever or infection -joint aches or pains -pale or blue-tinted skin -redness, blistering, peeling or loosening of the skin, including inside the mouth -tingling, burning, pain, or numbness in hands or feet -unusual bleeding or bruising -unusually weak or tired -yellowing of eyes or skin Side effects that usually do not require medical attention (report to your doctor or health care professional if they continue or are bothersome): -dark urine -diarrhea -headache -loss of appetite -nausea or  vomiting -temporary hair loss This list may not describe all possible side effects. Call your doctor for medical advice about side effects. You may report side effects to FDA at 1-800-FDA-1088. Where should I keep my medicine? Keep out of the reach of children. Store at room temperature between 15 and 30 degrees C (59 and 86 degrees F). Protect from light. Throw away any unused medicine after the expiration date. NOTE: This sheet is a summary. It may not cover all possible information. If you have questions about this medicine, talk to your doctor, pharmacist, or health care provider.  2015, [employer]. (2007-09-07 15:56:47)

## 2014-12-03 NOTE — Discharge Summary (Signed)
Physician Discharge Summary  Kirsten Flores UVO:[id number] DOB: October 12, 1923 DOA: 11/28/2014  PCP: Garnet Koyanagi, DO  Admit date: 11/28/2014 Discharge date: 12/03/2014  Recommendations for Outpatient Follow-up:  Prescription for macrobid provided. Please use if you notice foul smelling urine or if there is a fever and there is a suspicion of UTI.  Discharge Diagnoses:  Principal Problem:   Acute encephalopathy Active Problems:   UTI (lower urinary tract infection)   SIRS (systemic inflammatory response syndrome) (HCC)   Hypothyroidism   Dyslipidemia   Essential hypertension   Chronic diastolic CHF (congestive heart failure) (HCC)   Pleural effusion, bilateral   COPD, severe (HCC)   Acute on chronic respiratory failure with hypoxia (HCC)   CKD (chronic kidney disease), stage III   Hyperkalemia   Anemia of chronic disease   Depression   Dementia   Acute pulmonary edema (HCC)   Palliative care encounter    Discharge Condition: stable   Diet recommendation: as tolerated   History of present illness:  81 y.o. female with a past medical history significant for dementia, chronic COPD on 4 L home oxygen, chronic diastolic heart failure, squamous cancer of the skin (not treated per patient request in past), hypertension and hypothyroidism, recently hospitalized for E.Coli UTI (has gotten fosfomycin in hospital and has gotten bactrim at home). Patient was brought to [facility] because of worsening mental status changes and confusion.  Patient was found to be hypoxic on admission with oxygen saturation of 81% on room air. This has improved with BiPAP. For this reason she required admission to stepdown unit. In addition, her potassium was 5.7 and she was given IV Lasix in ED. Troponin was within normal limits. CT head showed old bilateral basal ganglia lacunar infarcts. Chest x-ray showed pulmonary edema with bilateral pleural effusion. Her urinalysis was significant for moderate  leukocytes and for this reason she was started on Primaxin.  Pt initially in SDU, transferred to medical floor 11/30/14. Urine culture showed no growth so Primaxin stopped 10/1.   Hospital Course:   Assessment/Plan:    Principal Problem:  Acute metabolic encephalopathy - Likely from combination of dementia and UTI.  - Urine culture collected on admission showed no growth. She was on Primaxin since admission through 10/1. - Her mental status has improved since admission, she is oriented to time, place and person   Active Problems:  E.Coli UTI (lower urinary tract infection) / SIRS (systemic inflammatory response syndrome) - Urine culture showed no growth. - As noted, primaxin stopped 10/1.   Dyslipidemia - Continue Lipitor and fenofibrate on discharge    Essential hypertension - Continue lasix 40 mg daily on discharge - Pt has potassium supplementation she will take on discharge as per prior to this admission    Chronic diastolic CHF (congestive heart failure) / Pleural effusion, bilateral / Acute pulmonary edema - Chest x-ray on admission showed pulmonary vascular congestion and cardiomegaly. This was again seen on repeat CXR 9/30. - She was started on IV lasix and now on PO lasix. She will continue lasix 40 mg daily.  - Continue Plavix and Lipitor on discharge.    COPD, severe / Acute on chronic respiratory failure with hypoxia - Off of BiPAP - Stable respiratory status.    CKD (chronic kidney disease), stage III - Baseline creatinine 1.13.  - Renal function WNL on this admission.     Hyperkalemia / Hypokalemia  - Secondary to potassium supplementation.  - Improved with lasix and then had hypokalemia which  is being supplemented. Now that she is on lasix it is ok to resume potassium supplement   Anemia of chronic disease - Secondary to chronic kidney disease.  - Hemoglobin stable   Depression / Dementia - Continue Zoloft  - Stable mental status     Hypothyroidism - Continue synthroid on discharge    DVT prophylaxis:  - Had SCD's and plavix in hospital - On discharge she will continue plavix     Code Status: DNR/DNI Family Communication: Family not at the bedside this am; spoke with her son and the daughter over the phone 10/2 and 10/3.   IV access:  Peripheral IV  Procedures and diagnostic studies:   Dg Chest Port 1 View 11/30/2014 1. Moderate CHF. Electronically Signed By: Kerby Moors M.D. On: 11/30/2014 14:48   Dg Chest 2 View 11/28/2014 Pulmonary edema with bilateral pleural effusions which have increased since the comparison study.   Ct Head Wo Contrast 11/28/2014 Old bilateral basal ganglia lacunar infarcts. Atrophy, chronic small vessel disease. No acute findings.    Medical Consultants:  Palliative care  Other Consultants:  Physical therapy Nutrition  IAnti-Infectives:   Primaxin 11/28/2014--> 12/01/2014   Signed:  Leisa Lenz, MD  [facility] 12/03/2014, 8:15 AM  Pager #: [phone]  Time spent in minutes: more than 30 minutes   Discharge Exam: Filed Vitals:   12/03/14 0555  BP: 123/41  Pulse: 66  Temp: 98.3 F (36.8 C)  Resp: 18   Filed Vitals:   12/02/14 0511 12/02/14 1325 12/02/14 2037 12/03/14 0555  BP: 116/61 130/62 120/37 123/41  Pulse: 60 74 79 66  Temp: 98.4 F (36.9 C) 98 F (36.7 C) 99 F (37.2 C) 98.3 F (36.8 C)  TempSrc: Oral Oral Oral Oral  Resp: 16 18 18 18   Height:      Weight:    85.1 kg (187 lb 9.8 oz)  SpO2: 97% 97% 95% 98%    General: Pt is alert, follows commands appropriately, not in acute distress Cardiovascular: Regular rate and rhythm, S1/S2 + Respiratory: Clear to auscultation bilaterally, no wheezing, no crackles, no rhonchi Abdominal: Soft, non tender, non distended, bowel sounds +, no guarding Extremities: no edema, no cyanosis, pulses palpable bilaterally DP and PT Neuro: Grossly nonfocal  Discharge  Instructions  Discharge Instructions    Call MD for:  difficulty breathing, headache or visual disturbances    Complete by:  As directed      Call MD for:  persistant nausea and vomiting    Complete by:  As directed      Call MD for:  severe uncontrolled pain    Complete by:  As directed      Diet - low sodium heart healthy    Complete by:  As directed      Discharge instructions    Complete by:  As directed   Prescription for macrobid provided. Please use if you notice foul smelling urine or if there is a fever and there is a suspicion of UTI.     Increase activity slowly    Complete by:  As directed             Medication List    STOP taking these medications        ADVIL PM 200-25 MG Caps  Generic drug:  Ibuprofen-Diphenhydramine HCl      TAKE these medications        acetaminophen 325 MG tablet  Commonly known as:  TYLENOL  Take 2 tablets (650  mg total) by mouth every 6 (six) hours as needed for mild pain (or Fever >/= 101).     atorvastatin 10 MG tablet  Commonly known as:  LIPITOR  Take 1 tablet (10 mg total) by mouth daily.     betaxolol 0.25 % ophthalmic suspension  Commonly known as:  BETOPTIC-S  Place 1 drop into both eyes 2 (two) times daily.     clopidogrel 75 MG tablet  Commonly known as:  PLAVIX  TAKE 1 BY MOUTH DAILY WITH BREAKFAST        feeding supplement (ENSURE ENLIVE) Liqd  Take 237 mLs by mouth 2 (two) times daily between meals.     fenofibrate 145 MG tablet  Commonly known as:  TRICOR  TAKE 1 BY MOUTH DAILY     furosemide 40 MG tablet  Commonly known as:  LASIX  Take 1 tablet (40 mg total) by mouth daily.     gabapentin 600 MG tablet  Commonly known as:  NEURONTIN  Take 1 tablet (600 mg total) by mouth at bedtime.     levothyroxine 125 MCG tablet  Commonly known as:  SYNTHROID, LEVOTHROID  Take 125 mcg by mouth daily before breakfast.     multivitamin with minerals Tabs tablet  Take 1 tablet by mouth daily.     nitrofurantoin  (macrocrystal-monohydrate) 100 MG capsule  Commonly known as:  MACROBID  Take 1 capsule (100 mg total) by mouth 2 (two) times daily.     pantoprazole 40 MG tablet  Commonly known as:  PROTONIX  TAKE 1 BY MOUTH DAILY     potassium chloride SA 20 MEQ tablet  Commonly known as:  K-DUR,KLOR-CON  Take 1 tablet (20 mEq total) by mouth daily.     sertraline 50 MG tablet  Commonly known as:  ZOLOFT  Take 1 tablet (50 mg total) by mouth daily.     VITAMIN D PO  Take 1 tablet by mouth daily.     Wheelchair Misc  24 inch wheel chair           Follow-up Information    Follow up with [facility].   Why:  Home Health RN, Physical Therapy, Occupational Therapy and aide   Contact information:   [address] [place] [address] [phone]       Follow up with Garnet Koyanagi, DO. Schedule an appointment as soon as possible for a visit in 1 week.   Specialty:  Family Medicine   Why:  Follow up appt after recent hospitalization   Contact information:   [place] [address] [phone]        The results of significant diagnostics from this hospitalization (including imaging, microbiology, ancillary and laboratory) are listed below for reference.    Significant Diagnostic Studies: Dg Chest 2 View  11/28/2014   CLINICAL DATA:  Tachypnea.  EXAM: CHEST  2 VIEW  COMPARISON:  PA and lateral chest 11/22/2014.  FINDINGS: There is increased pulmonary edema and moderate bilateral pleural effusions. Cardiomegaly is identified. Basilar airspace disease is likely atelectasis. No pneumothorax.  IMPRESSION: Pulmonary edema with bilateral pleural effusions which have increased since the comparison study.   Electronically Signed   By: Inge Rise M.D.   On: 11/28/2014 14:41   Dg Chest 2 View  11/22/2014   CLINICAL DATA:  Increased weakness for the past 2 days.  Ex-smoker.  EXAM: CHEST  2 VIEW  COMPARISON:  08/22/2014.  FINDINGS: Stable  enlarged cardiac silhouette. Prominent pulmonary vasculature. Small amount of right pleural thickening or fluid. Minimal bibasilar atelectasis or scarring. Mildly prominent interstitial markings with improvement. Diffuse peribronchial thickening. Diffuse osteopenia.  IMPRESSION: 1. Cardiomegaly and mild pulmonary vascular congestion. 2. Small amount of right pleural fluid or thickening. 3. Mild chronic interstitial lung disease and chronic bronchitic changes. 4. Small amount of atelectasis or scarring at both lung bases.   Electronically Signed   By: Claudie Revering M.D.   On: 11/22/2014 18:36   Ct Head Wo Contrast  11/28/2014   CLINICAL DATA:  Altered mental status  EXAM: CT HEAD WITHOUT CONTRAST  TECHNIQUE: Contiguous axial images were obtained from the base of the skull through the vertex without intravenous contrast.  COMPARISON:  07/22/2014  FINDINGS: There is atrophy and chronic small vessel disease changes. Old bilateral basal ganglia lacunar infarcts. No acute intracranial abnormality. Specifically, no hemorrhage, hydrocephalus, mass lesion, acute infarction, or significant intracranial injury. No acute calvarial abnormality. Rounded soft tissue in the right maxillary sinus, stable. Mastoid air cells are clear. Orbital soft tissues unremarkable.  IMPRESSION: Old bilateral basal ganglia lacunar infarcts.  Atrophy, chronic small vessel disease.  No acute findings.   Electronically Signed   By: Rolm Baptise M.D.   On: 11/28/2014 14:29   Dg Chest Port 1 View  11/30/2014   CLINICAL DATA:  Pleural effusion in shortness of breath. Increased confusion.  EXAM: PORTABLE CHEST 1 VIEW  COMPARISON:  11/28/2014  FINDINGS: Cardiac enlargement noted. There are moderate bilateral pleural effusions and mild interstitial edema compatible with CHF. Atelectasis is noted in both lung bases. No airspace consolidation identified.  IMPRESSION: 1. Moderate CHF.   Electronically Signed   By: Kerby Moors M.D.   On: 11/30/2014  14:48    Microbiology: Recent Results (from the past 240 hour(s))  Urine culture     Status: None   Collection Time: 11/28/14  2:51 PM  Result Value Ref Range Status   Specimen Description URINE, CATHETERIZED  Final   Special Requests NONE  Final   Culture   Final    NO GROWTH 1 DAY Performed at [facility]    Report Status 11/29/2014 FINAL  Final  MRSA PCR Screening     Status: None   Collection Time: 11/28/14  6:15 PM  Result Value Ref Range Status   MRSA by PCR NEGATIVE NEGATIVE Final    Comment:        The GeneXpert MRSA Assay (FDA approved for NASAL specimens only), is one component of a comprehensive MRSA colonization surveillance program. It is not intended to diagnose MRSA infection nor to guide or monitor treatment for MRSA infections.   Urine culture     Status: None   Collection Time: 11/29/14  3:26 PM  Result Value Ref Range Status   Specimen Description URINE, CLEAN CATCH  Final   Special Requests NONE  Final   Culture   Final    NO GROWTH 1 DAY Performed at [facility]    Report Status 11/30/2014 FINAL  Final     Labs: Basic Metabolic Panel:  Recent Labs Lab 11/28/14 1345 11/29/14 0400 12/02/14 0407 12/03/14 0433  NA 145 146* 143 141  K 5.7* 4.1 3.1* 3.7  CL 103 100* 90* 89*  CO2 37* 38* 49* 48*  GLUCOSE 101* 83 93 93  BUN 13 17 23* 27*  CREATININE 0.84 0.81 0.87 0.86  CALCIUM 9.8 9.8 8.9 9.0  MG  --  1.7  --   --  PHOS  --  2.4*  --   --    Liver Function Tests:  Recent Labs Lab 11/28/14 1345 11/29/14 0400  AST 62* 41  ALT 23 20  ALKPHOS 52 49  BILITOT 1.3* 1.0  PROT 5.4* 5.1*  ALBUMIN 2.6* 2.6*   No results for input(s): LIPASE, AMYLASE in the last 168 hours. No results for input(s): AMMONIA in the last 168 hours. CBC:  Recent Labs Lab 11/28/14 1345 11/29/14 0400 12/02/14 0407 12/03/14 0433  WBC 6.4 6.3 4.8 4.8  NEUTROABS 4.6 4.4  --   --   HGB 9.3* 9.1* 8.2* 8.7*  HCT 31.5* 29.7* 27.2* 29.1*   MCV 106.4* 105.3* 105.0* 103.2*  PLT 221 202 148* 143*   Cardiac Enzymes:  Recent Labs Lab 11/28/14 1345  TROPONINI <0.03   BNP: BNP (last 3 results)  Recent Labs  07/22/14 1810 08/13/14 2007 11/28/14 1504  BNP 715.8* 454.8* 540.9*    ProBNP (last 3 results) No results for input(s): PROBNP in the last 8760 hours.  CBG:  Recent Labs Lab 11/29/14 0735  GLUCAP 74

## 2014-12-03 NOTE — Progress Notes (Signed)
Pt to be discharged home requesting ambulance. CSW arranged and provided nurse with form.   Belia Heman, [place] Work  [employer] [phone]

## 2014-12-03 NOTE — Progress Notes (Signed)
Patient discharged home, all discharge medications and instructions reviewed and questions answered.  Patient to be transported home via [facility].  Patient's sone at bedside and states will schedule follow appointment with PCP after d/c when he is able to transport patient himself. Son made aware that PCP appointment to be scheduled for one week after d/c.

## 2014-12-04 ENCOUNTER — Other Ambulatory Visit: Payer: Self-pay | Admitting: Family Medicine

## 2014-12-05 ENCOUNTER — Telehealth: Payer: Self-pay | Admitting: Behavioral Health

## 2014-12-05 ENCOUNTER — Telehealth: Payer: Self-pay | Admitting: Family Medicine

## 2014-12-05 NOTE — Telephone Encounter (Signed)
Called and spoke to [place].  Verbal order given for speech therapy and SW if not already ordered.  Shandra ready back order for accuracy.    Message routed to Dr. Etter Sjogren for [facility].

## 2014-12-05 NOTE — Telephone Encounter (Signed)
Attempted to reach the patient x 3 regarding hospital follow-up. Line was busy each time.  Will try again later.

## 2014-12-05 NOTE — Telephone Encounter (Signed)
Caller name: Vickii Penna  Relation to pt: Physical Therapist from [facility]  Call back number: [phone]    Reason for call:  Requesting verbal orders for speech therapy consult for swallowing due to aspiration  risk. Patient had an episode due to patient choking drinking water. Requesting stat orders

## 2014-12-06 NOTE — Telephone Encounter (Signed)
Pt was admitted to hospital on 11/28/14 and discharged on 12/03/14.

## 2014-12-06 NOTE — Telephone Encounter (Signed)
noted 

## 2014-12-13 ENCOUNTER — Telehealth: Payer: Self-pay | Admitting: *Deleted

## 2014-12-13 NOTE — Telephone Encounter (Signed)
Home health certification and plan of care received via fax from [facility] for [facility] period 6/43/14-27/67/01. Forwarded to Dr. Etter Sjogren. JG//CMA

## 2014-12-21 ENCOUNTER — Telehealth: Payer: Self-pay | Admitting: Family Medicine

## 2014-12-21 DIAGNOSIS — N390 Urinary tract infection, site not specified: Secondary | ICD-10-CM

## 2014-12-21 NOTE — Telephone Encounter (Signed)
Ok to give verbal 

## 2014-12-21 NOTE — Telephone Encounter (Signed)
Please advise      KP 

## 2014-12-21 NOTE — Telephone Encounter (Signed)
Caller name: Jinny Blossom Relationship to patient:home health nurse Can be [place]:  Reason for call:Would like a verbal order for urinalysis and culture as patient is still having burning

## 2014-12-21 NOTE — Telephone Encounter (Signed)
Caller name: Forestine Chute with [facility]  Needs order for in and out cath for urine specimen bc pt is not able to stand and wears diapers for incontinence. Please fax to [phone] Attn: Forestine Chute.

## 2014-12-21 NOTE — Addendum Note (Signed)
Addended by: Ewing Schlein on: 12/21/2014 04:03 PM   Modules accepted: Orders

## 2014-12-21 NOTE — Telephone Encounter (Signed)
Verbal given to [facility] for the UA and culture    KP

## 2014-12-24 ENCOUNTER — Telehealth: Payer: Self-pay

## 2014-12-24 MED ORDER — CIPROFLOXACIN HCL 500 MG PO TABS: 500.0000 mg | ORAL_TABLET | Freq: Two times a day (BID) | ORAL | Status: AC

## 2014-12-24 NOTE — Telephone Encounter (Signed)
Preliminary report showed 100,000 colonies of E.coli. Per Dr.Lowne the patient needs to start Cipro 500 mg BID for 5 days. I made Megan aware as well as [name] the Rx has been faxed to [facility] as requested.    KP

## 2014-12-24 NOTE — Telephone Encounter (Signed)
-----   Message from Oneta Rack sent at 12/24/2014  4:18 PM EDT ----- Regarding: [facility] urine results  Contact: [phone] Caller name:Megan  Relation to pt: [place]  Call back number: [phone]   Reason for call:  Megan asked for you directly wanted to know if you received the urine results from her.

## 2014-12-31 ENCOUNTER — Telehealth: Payer: Self-pay

## 2014-12-31 NOTE — Telephone Encounter (Signed)
Verbal given to Kirsten Flores, she will continue PT for 2 weeks.     KP

## 2014-12-31 NOTE — Telephone Encounter (Signed)
Orders to continue PT verbal is fine Shaunda at [facility]

## 2015-01-04 ENCOUNTER — Encounter: Payer: Self-pay | Admitting: Family Medicine

## 2015-01-07 ENCOUNTER — Telehealth: Payer: Self-pay | Admitting: Family Medicine

## 2015-01-07 NOTE — Telephone Encounter (Signed)
Megan from [facility] states that patient refused to give urine sample today. [facility] will attempt again tomorrow to see if patient will comply.

## 2015-01-07 NOTE — Telephone Encounter (Signed)
Kirsten Flores reported the patient was still having UTI symptoms, dysuria and frequency but declined a UA today.     KP

## 2015-01-07 NOTE — Telephone Encounter (Signed)
noted 

## 2015-01-10 ENCOUNTER — Telehealth: Payer: Self-pay

## 2015-01-10 MED ORDER — SULFAMETHOXAZOLE-TRIMETHOPRIM 800-160 MG PO TABS: 1.0000 | ORAL_TABLET | Freq: Two times a day (BID) | ORAL | Status: AC

## 2015-01-10 NOTE — Telephone Encounter (Signed)
Call from [facility] with [facility] to check the status of Urine culture and I advised it is + for 100,000 colonies of E.coli. Per Dr.Lowne patient is to take Bactrim DS po bid x's 7 days. Megan voiced understanding, Rx faxed.        KP

## 2015-01-15 DIAGNOSIS — N390 Urinary tract infection, site not specified: Secondary | ICD-10-CM | POA: Diagnosis not present

## 2015-01-15 DIAGNOSIS — I5032 Chronic diastolic (congestive) heart failure: Secondary | ICD-10-CM | POA: Diagnosis not present

## 2015-01-15 DIAGNOSIS — J449 Chronic obstructive pulmonary disease, unspecified: Secondary | ICD-10-CM | POA: Diagnosis not present

## 2015-01-15 DIAGNOSIS — I10 Essential (primary) hypertension: Secondary | ICD-10-CM | POA: Diagnosis not present

## 2015-01-22 ENCOUNTER — Telehealth: Payer: Self-pay | Admitting: Family Medicine

## 2015-01-22 NOTE — Telephone Encounter (Signed)
Caller name:Megan  Relation to pt: RN from [place]  Call back number: [phone]    Reason for call:  Requesting a verbal order to recertified skilled nursing

## 2015-01-22 NOTE — Telephone Encounter (Signed)
Detailed message left for skilled nursing.     KP

## 2015-01-29 ENCOUNTER — Encounter: Payer: Self-pay | Admitting: Family Medicine

## 2015-02-11 ENCOUNTER — Telehealth: Payer: Self-pay | Admitting: *Deleted

## 2015-02-11 NOTE — Telephone Encounter (Signed)
Forwarded to Dr. Lowne. JG//CMA  

## 2015-02-14 NOTE — Telephone Encounter (Signed)
Signed forms faxed to [facility] successfully, sent for scanning. JG//CMA  

## 2015-02-26 ENCOUNTER — Telehealth: Payer: Self-pay | Admitting: Family Medicine

## 2015-02-26 NOTE — Telephone Encounter (Signed)
We can not order a Neb because the patient has not been seen.      KP

## 2015-02-26 NOTE — Telephone Encounter (Signed)
Caller: Jinny Blossom - Nurse with [facility]   She called in to make PCP aware that pt has cough phlem, white/ small amount. She did have some expletory wheezing. She says that pt doesn't have nebulizer & isn't sure if she needs one.    CB: [phone]

## 2015-02-26 NOTE — Telephone Encounter (Signed)
Ok to order neb with albuterol q6h prn Need CXR pa and lat Dx cough And should be seen as well in next day or 2

## 2015-02-26 NOTE — Telephone Encounter (Signed)
Mess left for Kirsten Flores to call the office      KP

## 2015-02-26 NOTE — Telephone Encounter (Signed)
To MD to review.     KP 

## 2015-03-01 NOTE — Telephone Encounter (Signed)
Spoke with patient and she said she is not sick, she is not wheezing but has a cough at night, No SOB but she said she does feel bad. She is on Oxygen and that helps her, she said the oxygen keeps her feeling pretty good with her breathing. She said she would have [facility] call back.     KP

## 2015-03-01 NOTE — Telephone Encounter (Signed)
Unfortunately ---  Medicare requires a face to face to get DME

## 2015-03-06 ENCOUNTER — Telehealth: Payer: Self-pay

## 2015-03-06 ENCOUNTER — Telehealth: Payer: Self-pay | Admitting: Family Medicine

## 2015-03-06 NOTE — Telephone Encounter (Signed)
If she is wheezing from one side only needs a chest x-ray. Okay to order a portable chest x-ray and send the results to [place] ASAP. If fever, chills or feeling worse: ER Okay to order a nebulizer Please make PCP aware of the situation.

## 2015-03-06 NOTE — Telephone Encounter (Signed)
[facility] called. States patient is having Expiratory Wheezing in Right lung  Left lung clear. Coughing up clear sputum. O2 =92% on 4 L/m.  Patient SOB,,afebrile, R= 20. Megan with [facility] would like order for Nebulizer for patient.  Patient bed bound and would not be able to come to office unless she came in ambulance per nurse. Please advise

## 2015-03-06 NOTE — Telephone Encounter (Signed)
3rd attempt to contact [facility]. Message left to call.     KP

## 2015-03-06 NOTE — Telephone Encounter (Signed)
Kirsten Flores with [facility]. Given orders from Dr. Larose Kells. Will forward information to Dr. Etter Sjogren. Jinny Blossom states she will put order in for Nebulizer.

## 2015-03-07 NOTE — Telephone Encounter (Signed)
Letter mailed     KP 

## 2015-03-07 NOTE — Telephone Encounter (Signed)
Called to follow up with patient this morning and spoke to her son [name].  He says that patient is doing about the same, but is resting.  She has not shown any signs of fever, chills, or worsening of symptoms.  He says that [facility] is suppose to come out today to perform portable chest x-ray.   He did express concerns regarding the expiratory wheezing.  He was encouraged to have mother cough and deep breath periodically during the day. Will ask provider about order for neb treatment prn.  Son states that patient has a nebulizer machine, but does not have neb treatments.  He says that patient was prescribed neb treatments after leaving [facility], but had trouble ever getting it due to insurance.   Please advise.

## 2015-03-07 NOTE — Telephone Encounter (Signed)
Please call and check on patient today. 

## 2015-03-07 NOTE — Telephone Encounter (Signed)
error:[id number] ° °

## 2015-03-07 NOTE — Telephone Encounter (Signed)
The problem is she would need face to face within 90 days  in the office for home health to do this and we have not seen her

## 2015-03-11 ENCOUNTER — Telehealth: Payer: Self-pay | Admitting: *Deleted

## 2015-03-11 NOTE — Telephone Encounter (Signed)
Received fax for Physician Order from [place]; forwarded to provider for sign/SLS

## 2015-03-13 NOTE — Telephone Encounter (Signed)
Spoke with Kirsten Flores she is sending the chest x ray results to [place] via fax.

## 2015-03-13 NOTE — Telephone Encounter (Signed)
Caller name: Jannica Pollinger Relationship to patient: son Can be reached: [phone] - please call 03/14/15 before 8:30am or after 3:00pm  Reason for call: He is following up on results from portable chest xray. He said they came out Thursday. He is also asking if the nebulizer medications have been ordered & covered by insurance. She has a nebulizer machine but no medications.

## 2015-03-13 NOTE — Telephone Encounter (Signed)
Called megan from [facility] and left a message  to inform her we need order for the chest x ray.

## 2015-03-14 ENCOUNTER — Telehealth: Payer: Self-pay | Admitting: Family Medicine

## 2015-03-14 NOTE — Telephone Encounter (Signed)
CXR impression: There is a mild central pulmonary venous congestion without Frank pulmonary Edema. Follow up recommended. Chronic chest findings are present. Dr.Lowne wants the patient to be seen. I have tried calling [name] and an message has been left to call the office.      KP

## 2015-03-14 NOTE — Telephone Encounter (Signed)
Pt's son [name] is returning your call. He is requesting a call back at: [phone]

## 2015-03-14 NOTE — Telephone Encounter (Signed)
Spoke wit [name] and he said he is unable to get his mother here, and wanted to know if Dr.Lowne was going to go half on the ambulance ride. I advised according to her CXR she needs to be seen and we are looking out for her best interest. I offered a referral for palliative care and he said he would need to discuss with his sister. I made him aware I could get someone from Hospice to call him and discuss all that they offer and he agreed. I gave Zenia Resides from [facility] care and Hospice the phone number and she said she would call and give me an update. [name] did not make Mrs.[place]orton an apt.     KP

## 2015-03-14 NOTE — Telephone Encounter (Signed)
I called Kirsten Flores and made her aware to have Kirsten Flores call the office. She is scheduled to go to the home tomorrow.    KP

## 2015-03-14 NOTE — Telephone Encounter (Signed)
noted 

## 2015-03-15 NOTE — Telephone Encounter (Signed)
Dawn called and said [name] did not answer the phone when she tried to call him yesterday. She will try again next week.    KP

## 2015-03-18 ENCOUNTER — Other Ambulatory Visit: Payer: Self-pay

## 2015-03-18 DIAGNOSIS — I509 Heart failure, unspecified: Secondary | ICD-10-CM

## 2015-03-18 DIAGNOSIS — J441 Chronic obstructive pulmonary disease with (acute) exacerbation: Secondary | ICD-10-CM

## 2015-03-18 DIAGNOSIS — N183 Chronic kidney disease, stage 3 unspecified: Secondary | ICD-10-CM

## 2015-03-18 NOTE — Telephone Encounter (Signed)
Hospice referral completed with [facility] care and Hospice.    KP

## 2015-03-27 ENCOUNTER — Telehealth: Payer: Self-pay | Admitting: Family Medicine

## 2015-03-27 NOTE — Telephone Encounter (Signed)
[place] dropped off document for KIM (referral document Community Care Update)

## 2015-03-28 NOTE — Telephone Encounter (Signed)
Document was advising patient is now receiving their services, I will send it to be scanned. Placed in Dr.Lowne's red folder for signature.     KP

## 2015-03-29 ENCOUNTER — Encounter: Payer: Self-pay | Admitting: Internal Medicine

## 2015-05-03 ENCOUNTER — Telehealth: Payer: Self-pay | Admitting: *Deleted

## 2015-05-03 NOTE — Telephone Encounter (Signed)
Received Hospice Comprehensive Assessment and Plan of Care Update Report; forwarded to provider/SLS 03/03

## 2015-05-03 NOTE — Telephone Encounter (Addendum)
Received Physician Verbal Order [x2]; forwarded to provider/SLS 03/03

## 2015-05-08 ENCOUNTER — Telehealth: Payer: Self-pay | Admitting: *Deleted

## 2015-05-08 NOTE — Telephone Encounter (Signed)
Received Physician Verbal Order; forwarded to provider/SLS 03/08

## 2015-05-15 ENCOUNTER — Telehealth: Payer: Self-pay | Admitting: *Deleted

## 2015-05-15 NOTE — Telephone Encounter (Signed)
Received Physician verbal order, forwarded to provider/SLS

## 2015-05-22 ENCOUNTER — Telehealth: Payer: Self-pay | Admitting: *Deleted

## 2015-05-22 NOTE — Telephone Encounter (Signed)
Received physician verbal order; forwarded to provider/SLS 03/22

## 2015-07-19 ENCOUNTER — Other Ambulatory Visit: Payer: Self-pay | Admitting: Family Medicine

## 2015-07-19 ENCOUNTER — Other Ambulatory Visit: Payer: Self-pay | Admitting: Emergency Medicine

## 2015-07-19 MED ORDER — FENOFIBRATE 145 MG PO TABS: ORAL_TABLET | ORAL | Status: AC

## 2015-07-19 NOTE — Telephone Encounter (Signed)
Ok to refill but since she is hospice I'm ok with d/c if pt and family agree

## 2015-07-19 NOTE — Telephone Encounter (Signed)
Last filled: 06/25/14 Amt: 90, 3 Last OV: 05/01/14 Pt currently receiving hospice care.  Ok to refill.  Please advise.

## 2015-07-19 NOTE — Telephone Encounter (Signed)
Refill called into [facility].  Son made aware.  No further questions or concerns voiced.

## 2015-07-19 NOTE — Telephone Encounter (Signed)
Melissa from [employer] order - [phone]   requesting refill for fenofibrate.    [facility] (MAIL ORDER) [facility] - [place], [place]

## 2015-07-25 ENCOUNTER — Telehealth: Payer: Self-pay | Admitting: Family Medicine

## 2015-07-25 MED ORDER — SERTRALINE HCL 50 MG PO TABS: ORAL_TABLET | ORAL | Status: DC

## 2015-07-25 MED ORDER — LEVOTHYROXINE SODIUM 125 MCG PO TABS: 125.0000 ug | ORAL_TABLET | Freq: Every day | ORAL | Status: DC

## 2015-07-25 NOTE — Telephone Encounter (Signed)
Spoke with [name] and he stated the patient needs to get a refill on her Zoloft and Levothyroxine sent to [facility] as well as a 30 day supply sent to [facility] for the Levothyroxine 125 because she is completely out of medication. Both med's have been faxed     KP

## 2015-07-25 NOTE — Telephone Encounter (Signed)
Caller name:Randy Brayton Caves  Relation to pt:son Call back [place]: [place] ([place]) [place], [place] [phone] (Phone) [phone] (Fax         Reason for call:  Son requesting a refill sertraline (ZOLOFT) 50 MG tablet  and levothyroxine (SYNTHROID, LEVOTHROID) 125 MCG tablet. Son states he's very frustrated because he has to go to the pharmacy every 2 weeks due to hospice not refilling medication more then a month. Son would like to discuss.

## 2015-07-30 ENCOUNTER — Telehealth: Payer: Self-pay | Admitting: Family Medicine

## 2015-07-30 MED ORDER — GABAPENTIN 600 MG PO TABS: ORAL_TABLET | ORAL | Status: DC

## 2015-07-30 MED ORDER — POTASSIUM CHLORIDE CRYS ER 20 MEQ PO TBCR: 20.0000 meq | EXTENDED_RELEASE_TABLET | Freq: Every day | ORAL | Status: DC

## 2015-07-30 NOTE — Telephone Encounter (Signed)
Caller name: Kaelen Govoni Relationship to patient: son Can be reached: [phone]  Reason for call: Pt is out of gabapentin and potassium - Please send 2 weeks to [facility] locally ([id number] [place], [address]). Then please send 90 day supply of both meds to [facility] (mail order).

## 2015-07-30 NOTE — Telephone Encounter (Signed)
Med's have been sent.    KP

## 2015-08-01 NOTE — Telephone Encounter (Signed)
error 

## 2015-08-05 ENCOUNTER — Telehealth: Payer: Self-pay | Admitting: Family Medicine

## 2015-08-05 NOTE — Telephone Encounter (Signed)
Caller name: Mr. Orear Relation to pt: son Call back number: [phone]   Reason for call:  Son is requesting a few pills of  pantoprazole (PROTONIX) 40 MG tablet and furosemide (LASIX) 40 MG tablet please send to retail  [id number] - [place], [address] AT [address] [phone] (Phone) [phone] (Fax)       And the remainder to  [place] ([place]) [place], [place] [phone] (Phone) [phone] (Fax)

## 2015-08-05 NOTE — Telephone Encounter (Signed)
Ok to send #30 to local pharmacy and #90 to mail order with 3 refills

## 2015-08-06 MED ORDER — PANTOPRAZOLE SODIUM 40 MG PO TBEC: 40.0000 mg | DELAYED_RELEASE_TABLET | Freq: Every day | ORAL | Status: DC

## 2015-08-06 MED ORDER — FUROSEMIDE 40 MG PO TABS: 40.0000 mg | ORAL_TABLET | Freq: Every day | ORAL | Status: DC

## 2015-08-06 NOTE — Telephone Encounter (Signed)
The medications have been sent to both pharmacies.      KP

## 2015-09-05 ENCOUNTER — Telehealth: Payer: Self-pay

## 2015-09-05 NOTE — Telephone Encounter (Signed)
Received Physician Verbal Order request via fax from [facility] for wound care.  Forms numbered and forwarded to provider for review and signature.

## 2015-09-16 ENCOUNTER — Telehealth: Payer: Self-pay | Admitting: Family Medicine

## 2015-09-16 NOTE — Telephone Encounter (Signed)
error:[id number] ° °

## 2015-09-25 ENCOUNTER — Telehealth: Payer: Self-pay | Admitting: Family Medicine

## 2015-09-25 NOTE — Telephone Encounter (Signed)
Caller name: Caryl Pina from [facility]  Relation to pt: RN Call back number: [phone] fax Rx [phone]    Reason for call:  During home visit with patient RN observed patient experiencing congestion,productive cough and yellow mucus. RN states she feels as if patient may be coming down with a upper respiratory infection requesting antibiotic please fax Rx [phone]

## 2015-09-26 MED ORDER — AMOXICILLIN-POT CLAVULANATE 875-125 MG PO TABS: 1.0000 | ORAL_TABLET | Freq: Two times a day (BID) | ORAL | 0 refills | Status: AC

## 2015-09-26 NOTE — Telephone Encounter (Signed)
Medication faxed as requested. Left message w/ Caryl Pina at number below informing her.

## 2015-09-26 NOTE — Telephone Encounter (Signed)
augmentin 875 bid x 10 days  

## 2015-09-30 ENCOUNTER — Telehealth: Payer: Self-pay | Admitting: *Deleted

## 2015-09-30 NOTE — Telephone Encounter (Signed)
Received Hospice Physician order paperwork via fax from [place].  Placed in the folder for Dr. Etter Sjogren Chase.//AB/CMA

## 2015-11-26 ENCOUNTER — Telehealth: Payer: Self-pay | Admitting: Family Medicine

## 2015-11-26 DIAGNOSIS — Z23 Encounter for immunization: Secondary | ICD-10-CM

## 2015-11-26 MED ORDER — INFLUENZA VIRUS VACC SPLIT PF IM SUSP: 0.5000 mL | Freq: Once | INTRAMUSCULAR | 0 refills | Status: DC

## 2015-11-26 NOTE — Telephone Encounter (Signed)
Please advise      KP 

## 2015-11-26 NOTE — Telephone Encounter (Signed)
High dose flu

## 2015-11-26 NOTE — Telephone Encounter (Signed)
Pharmacy: [facility], [address] AT [facility] [address]  Phone number: [phone] or [phone]   Patient is bed ridden but is requesting a flu shot patient is wondering if we can send a prescription for the flu shot to the pharmacy and hospice can give the injection.

## 2015-11-26 NOTE — Telephone Encounter (Signed)
Kirsten Flores is aware the RX has been faxed.     KP

## 2015-11-28 NOTE — Telephone Encounter (Signed)
Pt's son called back in. He says that Kirsten Flores is unable to provider flu vac for pt. He says that they are stating that Medicare is giving push back about paying for vac. He says that he would like to be advised on what he should do?     [phone] -  [name]

## 2015-11-29 MED ORDER — INFLUENZA VIRUS VACC SPLIT PF IM SUSP: 0.5000 mL | Freq: Once | INTRAMUSCULAR | 0 refills | Status: AC

## 2015-11-29 NOTE — Telephone Encounter (Signed)
I called [facility] and the insurance will not pay for them to dispense the flu shot. The pharmacist said that Kirsten Flores has been advised to call the insurance company and find the preferred pharmacy. I called [employer] and they said the flu shot is covered but only at [employer], [facility] and some [employer]. I tried to call Kirsten Flores but he was not home and his VM on his cell is not set-up.      KP

## 2015-11-29 NOTE — Telephone Encounter (Signed)
Spoke with Kirsten Flores and he verbalized understanding, Rx has been faxed to [facility] in [place] and [place].     KP

## 2015-11-29 NOTE — Addendum Note (Signed)
Addended by: Ewing Schlein on: 11/29/2015 04:53 PM   Modules accepted: Orders

## 2015-12-11 ENCOUNTER — Telehealth: Payer: Self-pay | Admitting: Family Medicine

## 2015-12-11 NOTE — Telephone Encounter (Signed)
Pt dropped off [employer] emergency assistant form to be filled out by dr. Etter Sjogren, pt would like form faxed back asap, documents were placed in tray at front desk

## 2015-12-13 ENCOUNTER — Telehealth: Payer: Self-pay | Admitting: *Deleted

## 2015-12-13 NOTE — Telephone Encounter (Signed)
Received Plan of Care Update Report via fax from [place].  Placed in folder for Dr. Minette Brine

## 2015-12-17 ENCOUNTER — Telehealth: Payer: Self-pay

## 2015-12-17 NOTE — Telephone Encounter (Signed)
Patients forms are in the front awaiting for her to finish the back form. Patient has been advised about picking forms up. PC

## 2015-12-26 ENCOUNTER — Encounter (HOSPITAL_COMMUNITY): Payer: Self-pay | Admitting: Emergency Medicine

## 2015-12-26 ENCOUNTER — Emergency Department (HOSPITAL_COMMUNITY)

## 2015-12-26 ENCOUNTER — Emergency Department (HOSPITAL_COMMUNITY)
Admission: EM | Admit: 2015-12-26 | Discharge: 2015-12-26 | Disposition: A | Discharge status: Home / Self Care | Attending: Emergency Medicine | Admitting: Emergency Medicine

## 2015-12-26 DIAGNOSIS — Z87891 Personal history of nicotine dependence: Secondary | ICD-10-CM | POA: Insufficient documentation

## 2015-12-26 DIAGNOSIS — J449 Chronic obstructive pulmonary disease, unspecified: Secondary | ICD-10-CM | POA: Diagnosis not present

## 2015-12-26 DIAGNOSIS — Z79899 Other long term (current) drug therapy: Secondary | ICD-10-CM | POA: Diagnosis not present

## 2015-12-26 DIAGNOSIS — R0989 Other specified symptoms and signs involving the circulatory and respiratory systems: Secondary | ICD-10-CM | POA: Insufficient documentation

## 2015-12-26 DIAGNOSIS — Z85828 Personal history of other malignant neoplasm of skin: Secondary | ICD-10-CM | POA: Diagnosis not present

## 2015-12-26 DIAGNOSIS — N183 Chronic kidney disease, stage 3 (moderate): Secondary | ICD-10-CM | POA: Diagnosis not present

## 2015-12-26 DIAGNOSIS — I5032 Chronic diastolic (congestive) heart failure: Secondary | ICD-10-CM | POA: Diagnosis not present

## 2015-12-26 DIAGNOSIS — I130 Hypertensive heart and chronic kidney disease with heart failure and stage 1 through stage 4 chronic kidney disease, or unspecified chronic kidney disease: Secondary | ICD-10-CM | POA: Diagnosis not present

## 2015-12-26 DIAGNOSIS — E039 Hypothyroidism, unspecified: Secondary | ICD-10-CM | POA: Diagnosis not present

## 2015-12-26 NOTE — ED Notes (Signed)
Bed: WA04 Expected date:  Expected time:  Means of arrival:  Comments: EMS-COPD- 

## 2015-12-26 NOTE — ED Notes (Signed)
Patient is A & O x4.  Family member understood discharge instructions.  

## 2015-12-26 NOTE — ED Provider Notes (Signed)
[facility] DEPT Provider Note   CSN: [id number] Arrival date & time: 12/26/15  1258     History   Chief Complaint Chief Complaint  Patient presents with  . COPD    HPI Kirsten Flores is a 82 y.o. female.HPI   She presents via EMS for evaluation after episode of choking at home. She has history of end-stage COPD. She is on 4 L of O2 24 7. She has home health care nursing in hospice care 24 hours per day.  Her son accompanies her here. Proximally 9:30 this morning, 6 hours ago, patient was eating a waffle. She had a choking episode. Apparently her "lips turned purple". Short time later she son was at work. Apparently he received a call from the hospice nursing that they requested she be evaluated. She was transferred by EMS. Son met her here. Son states that she looks at her baseline. She was given a Valium this afternoon which is normal for her.  Past Medical History:  Diagnosis Date  . Arthritis   . B12 deficiency   . Cancer (HCC)    Squamous cell skin cancer, no treatment  . COPD (chronic obstructive pulmonary disease) ([place])   . Gait disturbance   . Hyperlipidemia   . Hypertension   . Knee pain    RIGHT  . Memory loss   . Osteopenia   . Thyroid disease     Patient Active Problem List   Diagnosis Date Noted  . Palliative care encounter   . CKD (chronic kidney disease), stage III 11/28/2014  . Hyperkalemia 11/28/2014  . Anemia of chronic disease 11/28/2014  . SIRS (systemic inflammatory response syndrome) ([place]) 11/28/2014  . Acute encephalopathy 11/28/2014  . Depression 11/28/2014  . Dementia 11/28/2014  . Acute pulmonary edema ([place]) 11/28/2014  . Acute on chronic respiratory failure with hypoxia ([place])   . COPD, severe ([place]) 11/22/2014  . Pleural effusion, bilateral   . Chronic diastolic CHF (congestive heart failure) ([place])   . UTI (lower urinary tract infection) 07/11/2014  . Hypothyroidism 08/27/2006  . Dyslipidemia 08/27/2006  . Essential hypertension  08/27/2006    Past Surgical History:  Procedure Laterality Date  . ABDOMINAL HYSTERECTOMY    . APPENDECTOMY    . CHOLECYSTECTOMY    . OOPHORECTOMY      OB History    No data available       Home Medications    Prior to Admission medications   Medication Sig Start Date End Date Taking? Authorizing Provider  acetaminophen (TYLENOL) 325 MG tablet Take 2 tablets (650 mg total) by mouth every 6 (six) hours as needed for mild pain (or Fever >/= 101). 12/03/14   Robbie Lis, MD  amoxicillin-clavulanate (AUGMENTIN) 875-125 MG tablet Take 1 tablet by mouth 2 (two) times daily. 09/26/15   Rosalita Chessman Chase, DO  atorvastatin (LIPITOR) 10 MG tablet Take 1 tablet (10 mg total) by mouth daily. 05/28/14   Rosalita Chessman Chase, DO  betaxolol (BETOPTIC-S) 0.25 % ophthalmic suspension Place 1 drop into both eyes 2 (two) times daily.      Historical Provider, MD  Cholecalciferol (VITAMIN D PO) Take 1 tablet by mouth daily.    Historical Provider, MD  ciprofloxacin (CIPRO) 500 MG tablet Take 1 tablet (500 mg total) by mouth 2 (two) times daily. 12/24/14   Rosalita Chessman Chase, DO  clopidogrel (PLAVIX) 75 MG tablet TAKE 1 BY MOUTH DAILY WITH BREAKFAST 12/04/14   Ann Held, DO  dextromethorphan-guaiFENesin (MUCINEX DM) 30-600 MG per 12 hr tablet Take 1 tablet by mouth 2 (two) times daily. 08/26/14   Geradine Girt, DO  feeding supplement, ENSURE ENLIVE, (ENSURE ENLIVE) LIQD Take 237 mLs by mouth 2 (two) times daily between meals. 12/03/14   Robbie Lis, MD  fenofibrate (TRICOR) 145 MG tablet TAKE 145 MG BY MOUTH DAILY 07/19/15   Alferd Apa Lowne Chase, DO  furosemide (LASIX) 40 MG tablet Take 1 tablet (40 mg total) by mouth daily. 08/06/15   Rosalita Chessman Chase, DO  gabapentin (NEURONTIN) 600 MG tablet TAKE 1 BY MOUTH AT BEDTIME 07/30/15   Rosalita Chessman Chase, DO  levothyroxine (SYNTHROID, LEVOTHROID) 125 MCG tablet Take 1 tablet (125 mcg total) by mouth daily before breakfast. 07/25/15   Ann Held, DO  Misc. Devices [facility]) MISC 24 inch wheel chair 09/25/14   Ann Held, DO  Multiple Vitamin (MULTIVITAMIN WITH MINERALS) TABS tablet Take 1 tablet by mouth daily.    Historical Provider, MD  nitrofurantoin, macrocrystal-monohydrate, (MACROBID) 100 MG capsule Take 1 capsule (100 mg total) by mouth 2 (two) times daily. 12/03/14   Robbie Lis, MD  pantoprazole (PROTONIX) 40 MG tablet Take 1 tablet (40 mg total) by mouth daily. 08/06/15   Rosalita Chessman Chase, DO  potassium chloride SA (K-DUR,KLOR-CON) 20 MEQ tablet Take 1 tablet (20 mEq total) by mouth daily. 07/30/15   Rosalita Chessman Chase, DO  sertraline (ZOLOFT) 50 MG tablet TAKE 1 BY MOUTH DAILY 07/25/15   Rosalita Chessman Chase, DO  sulfamethoxazole-trimethoprim (BACTRIM DS,SEPTRA DS) 800-160 MG tablet Take 1 tablet by mouth 2 (two) times daily. 01/10/15   Ann Held, DO    Family History Family History  Problem Relation Age of Onset  . Cancer Other     COLON...1ST DEGREE RELATIVE  . Colon cancer    . Melanoma      Social History Social History  Substance Use Topics  . Smoking status: Former Smoker    Packs/day: 2.00    Years: 25.00    Types: Cigarettes    Quit date: 03/03/1971  . Smokeless tobacco: Never Used  . Alcohol use 0.0 oz/week     Comment: 1 cocktail in the evening and a glass of wine - not everyday but doesn't state how often     Allergies   Diltiazem hcl and Neomycin   Review of Systems Review of Systems  Unable to perform ROS: Patient nonverbal     Physical Exam Updated Vital Signs BP (!) 118/48 (BP Location: Left Arm)   Pulse 97   Temp 98.4 F (36.9 C)   Resp 16   Ht '5\' 6"'$  (1.676 m)   Wt 150 lb (68 kg)   SpO2 94%   BMI 24.21 kg/m   Physical Exam  Constitutional: No distress.  HENT:  Head: Normocephalic and atraumatic.  Pharynx normal  Eyes: EOM are normal.  No petechiae.  Neck:  Supple. No JVD. No stridor. No increased work of breathing.  Cardiovascular:   Heart rate 81. Sinus on the monitor.  Pulmonary/Chest:  Clear bilateral breath sounds. No increased work of breathing. No crackles or rales.  Abdominal:  Soft benign  Neurological:  Nonverbal. Moves all 4 extremities.     ED Treatments / Results  Labs (all labs ordered are listed, but only abnormal results are displayed) Labs Reviewed - No data to display  EKG  EKG Interpretation None  Radiology Dg Chest 2 View  Result Date: 12/26/2015 CLINICAL DATA:  Choking. EXAM: CHEST  2 VIEW COMPARISON:  11/30/2014 . FINDINGS: Mediastinum hilar structures are unremarkable. Mild cardiomegaly. Low lung volumes. Bibasilar atelectasis. Bibasilar infiltrates cannot be excluded . Small bilateral pleural effusions cannot be excluded. Aortic atherosclerotic noted. IMPRESSION: 1.  Stable cardiomegaly.  No evidence over congestive heart failure. 2. Aortic atherosclerotic vascular calcification. 3. Bibasilar atelectasis. Bibasilar infiltrates cannot be excluded. Small bilateral pleural effusions. Electronically Signed   By: Marcello Moores  Register   On: 12/26/2015 14:38    Procedures Procedures (including critical care time)  Medications Ordered in ED Medications - No data to display   Initial Impression / Assessment and Plan / ED Course  I have reviewed the triage vital signs and the nursing notes.  Pertinent labs & imaging results that were available during my care of the patient were reviewed by me and considered in my medical decision making (see chart for details).  Clinical Course    Chest x-ray suggests bibasilar atelectasis. She is afebrile. She is oxygenating 97% 104.5 L. She has no increased work of breathing. Do not feel this represents aspiration pneumonitis. She likely has expelled a previous pharyngeal foreign body. No symptoms of unilateral atelectasis or asymmetry on exam or x-ray to suggest retained mainstem foreign body. She is appropriate for return back to her facility  without additional testing.  Final Clinical Impressions(s) / ED Diagnoses   Final diagnoses:  Choking episode    New Prescriptions New Prescriptions   No medications on file     Tanna Furry, MD 12/26/15 1536

## 2015-12-26 NOTE — ED Triage Notes (Signed)
Patient is end stage COPD.  Patient is under Hospice care.  Patient was found to be choking by caregiver and her lips was turning purple.  Patient cleared her airway on her own.    When Hospice arrived, patient's O2 was in 70%.  Hospice wanted patient evaluated for possible partial obstruction.  Per EMS, patient has a productive cough.  Patient is on 4 liters at home with O2 of 84%.  EMS had patient at 4 L with O2 of 91%.  BP:116/44 HR:80-90 R:20 O2: 91% on 4 Liters   No medication given enroute to hospital

## 2015-12-26 NOTE — Discharge Instructions (Signed)
No changes in her current treatment.  Recheck as needed with any changing, worsening, or recurring symptoms.

## 2016-01-16 ENCOUNTER — Telehealth: Payer: Self-pay

## 2016-01-16 ENCOUNTER — Telehealth: Payer: Self-pay | Admitting: Family Medicine

## 2016-01-16 MED ORDER — MUPIROCIN CALCIUM 2 % EX CREA: 1.0000 "application " | TOPICAL_CREAM | Freq: Two times a day (BID) | CUTANEOUS | 0 refills | Status: DC

## 2016-01-16 MED ORDER — MUPIROCIN 2 % EX OINT: 1.0000 "application " | TOPICAL_OINTMENT | Freq: Two times a day (BID) | CUTANEOUS | 0 refills | Status: DC

## 2016-01-16 NOTE — Telephone Encounter (Signed)
Rx. sent to pharmacy. No number noted for [facility]. [place] nurse to make her aware that Rx had been sent.  She said she would txt Langley Gauss and make her aware.  She also provided number for [facility]---[phone].

## 2016-01-16 NOTE — Telephone Encounter (Signed)
Ok to refill 

## 2016-01-16 NOTE — Telephone Encounter (Signed)
[place] [facility] nurse -  She called in to request a refill on pt's Muciprocin ointment. She says that PCP has prescribed before. She says that pt has a small rash under her breast .     Pharmacy: [facility], [address] AT [address]

## 2016-01-16 NOTE — Telephone Encounter (Signed)
PA initiated via Covermymeds; KEY: [id number]. Awaiting determination.

## 2016-01-16 NOTE — Telephone Encounter (Signed)
yes

## 2016-01-16 NOTE — Telephone Encounter (Signed)
Mupirocin cream d/c, 2 % ointment sent to [facility].

## 2016-01-16 NOTE — Telephone Encounter (Signed)
Received call from [place] at [facility] needing to know if medication is for superficial infection. Informed to call Pt's hospice RN at [phone].

## 2016-01-16 NOTE — Telephone Encounter (Signed)
Spoke w/ [employer], Mupirocin 2 % cream denied, however, formulary alternative is Mupirocin 2 % ointment. Okay to switch to ointment?

## 2016-01-27 ENCOUNTER — Telehealth: Payer: Self-pay | Admitting: Family Medicine

## 2016-01-27 NOTE — Telephone Encounter (Signed)
Called [facility]. LM to return call.

## 2016-01-27 NOTE — Telephone Encounter (Signed)
Caller name: Langley Gauss  Relation to pt: RN from  Call back number: [phone]    Reason for call:  Wanted to inform PCP patient has a small area on left big toe, foot is swollen and red possible celluities, requesting clinical advice or Rx

## 2016-01-27 NOTE — Telephone Encounter (Signed)
Kirsten Gauss RN called back. States pt has nickel size black eschar spot on left great toe and that the spot has been there for approximately 2 months. States pt has developed new redness extending on the inside of foot. Notes the foot and lower leg as warm to touch. States pt c/o mild pain. States pt is afebrile. Please advise.

## 2016-01-27 NOTE — Telephone Encounter (Signed)
Pt needs appointment asap.

## 2016-01-28 ENCOUNTER — Telehealth: Payer: Self-pay | Admitting: Family Medicine

## 2016-01-28 NOTE — Telephone Encounter (Signed)
Yes -- ok to do cxr

## 2016-01-28 NOTE — Telephone Encounter (Signed)
Spoke with [facility] RN). States pt is bed bound and unable to come in for office visit. States she can send picture of wound if needed for a better visual to secure phone or have her boss send through secure email if possible. Please advise.

## 2016-01-28 NOTE — Telephone Encounter (Signed)
Caller name: Langley Gauss  Relation to pt: RN from [employer] back number: [phone]    Reason for call:  Requesting chest x ray at home due to wet cough Ox saturation is dropping to 80 with movement, crackle in the lung, patient is bed bound requesting verbal orders, please advise

## 2016-01-28 NOTE — Telephone Encounter (Signed)
She may need to go to Er --  If unable to get here---- someone needs to see and feel the foot a picture is not usually good enough And in another message she has a we cough-- she'll need cxr r/o pneumonia

## 2016-01-29 NOTE — Telephone Encounter (Signed)
Called [facility]. Notified of advice that pt either needs to go to ER or come here to be seen for foot wound. She states that pt will most likely not do so. 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 She will discuss issue with her Market researcher. Langley Gauss also stated she has already received order from her Market researcher for mobile chest xray. She will send cxr results to office once complete. She has no further requests at this time.

## 2016-01-29 NOTE — Telephone Encounter (Signed)
See previous phone note dated for same. Per Langley Gauss: cxr ordered by her Market researcher.

## 2016-02-03 NOTE — Telephone Encounter (Signed)
Was it given to someone else to see since I was not here  Last week?  I  Did not see it .

## 2016-02-04 NOTE — Telephone Encounter (Signed)
No CXR report noted.  Called [facility] for a follow up. No answer. Left a message for call back.

## 2016-02-04 NOTE — Telephone Encounter (Signed)
The chest xray has not been received that I am aware of.  Ashlee, Please keep an eye out for CXR report.

## 2016-02-05 NOTE — Telephone Encounter (Signed)
Spoke with Langley Gauss and she stated that CXR report was faxed over yesterday. She is going to have the office fax it over again today.

## 2016-02-05 NOTE — Telephone Encounter (Signed)
Noted.  CXR report received and forwarded to PCP.

## 2016-02-05 NOTE — Telephone Encounter (Signed)
Called Langley Gauss again to follow up regarding CXR report.  No answer.  Left a message for call back.

## 2016-02-06 NOTE — Telephone Encounter (Signed)
No pneumonia-- how is pt feeling?   Since pt is unable to come to office is there a dr with hospice that can see the pt when needed?

## 2016-02-06 NOTE — Telephone Encounter (Signed)
Called and left a message for call back  

## 2016-02-07 NOTE — Telephone Encounter (Signed)
Called [facility] to follow up.  Langley Gauss states patient is doing ok. Pt is declining.  O2 sats drop even with turning.  Medical Director started patient on Levaquin for URI and toe.  Her wet cough seems to be resolving.  They also started her on morphine for respiratory support.  Langley Gauss will continue to notify office with updates.

## 2016-02-13 ENCOUNTER — Other Ambulatory Visit: Payer: Self-pay

## 2016-02-13 ENCOUNTER — Telehealth: Payer: Self-pay | Admitting: Family Medicine

## 2016-02-13 MED ORDER — GABAPENTIN 600 MG PO TABS: ORAL_TABLET | ORAL | 1 refills | Status: AC

## 2016-02-13 MED ORDER — POTASSIUM CHLORIDE CRYS ER 20 MEQ PO TBCR: 20.0000 meq | EXTENDED_RELEASE_TABLET | Freq: Every day | ORAL | 1 refills | Status: AC

## 2016-02-13 MED ORDER — LEVOTHYROXINE SODIUM 125 MCG PO TABS: 125.0000 ug | ORAL_TABLET | Freq: Every day | ORAL | 0 refills | Status: DC

## 2016-02-13 MED ORDER — SERTRALINE HCL 50 MG PO TABS: ORAL_TABLET | ORAL | 1 refills | Status: AC

## 2016-02-13 NOTE — Telephone Encounter (Signed)
Rx sent to pharmacy, per pt's request. LB

## 2016-02-13 NOTE — Telephone Encounter (Signed)
Caller name: Relationship to patient: Son Can be reached: [phone]  Pharmacy:  [facility] filled by [facility], [address] [phone] (Phone) [phone] (Fax)     Reason for call: Refill levothyroxine (SYNTHROID, LEVOTHROID) 125 MCG tablet [id number]  gabapentin (NEURONTIN) 600 MG tablet [id number]  potassium chloride SA (K-DUR,KLOR-CON) 20 MEQ tablet [id number] sertraline (ZOLOFT) 50 MG tablet [id number]

## 2016-02-19 ENCOUNTER — Other Ambulatory Visit: Payer: Self-pay

## 2016-02-19 ENCOUNTER — Telehealth: Payer: Self-pay

## 2016-02-19 DIAGNOSIS — J189 Pneumonia, unspecified organism: Secondary | ICD-10-CM

## 2016-02-19 IMAGING — CT CT HEAD W/O CM
1 series · 16 of 30 positions shown, 20 images · non-contrast
Comparison: 07/11/2014

CLINICAL DATA: Shortness breath and altered level of consciousness.

EXAM:
CT HEAD WITHOUT CONTRAST
TECHNIQUE: Contiguous axial images were obtained from the base of the skull
through the vertex without contrast.

[Series 2: head 4.8 h37s · axial · 0.46mm/px · z∈[+67,+203]mm · 16 of 32 slices shown, 20 images]
[im 2/32  brain]
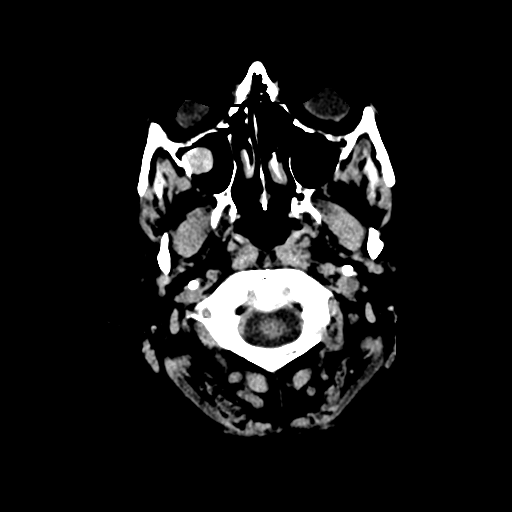
[im 2/32  bone]
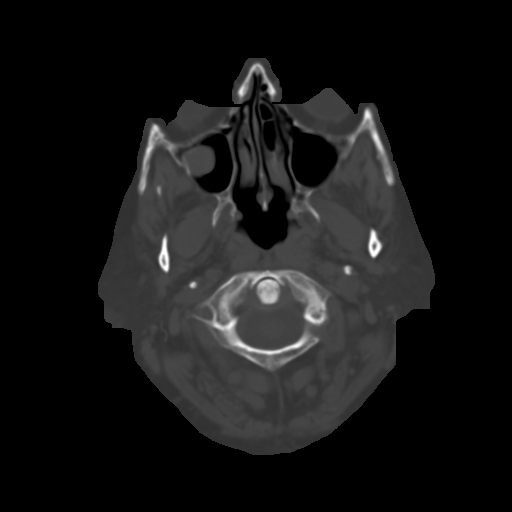
[im 4/32  brain]
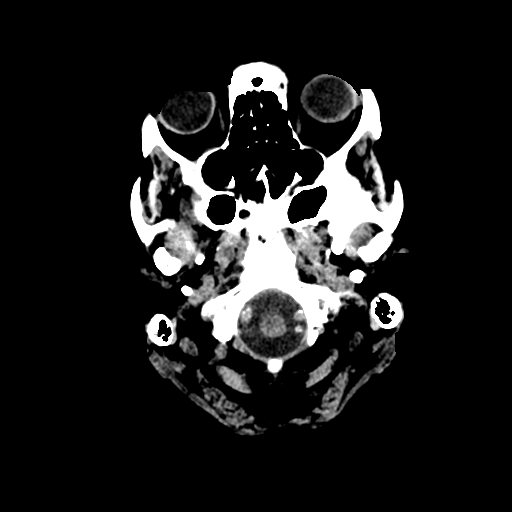
[im 6/32  brain]
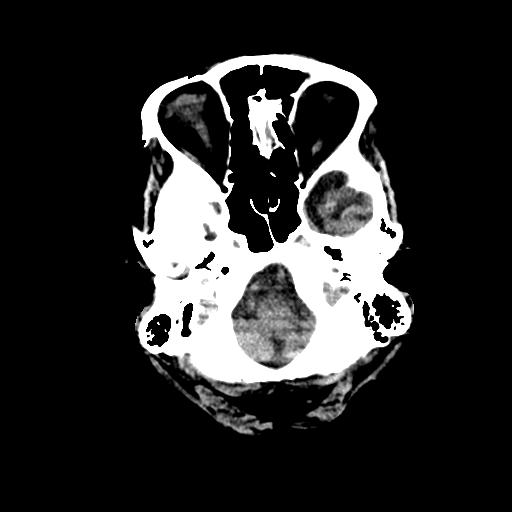
[im 8/32  brain]
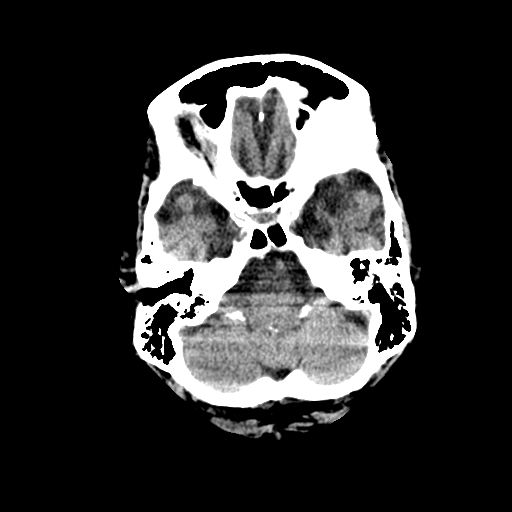
[im 9/32  brain]
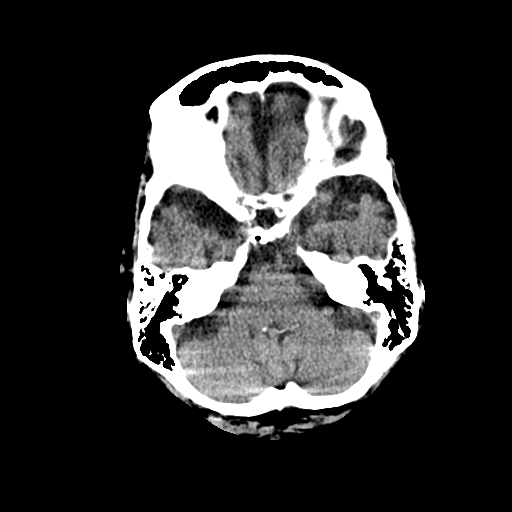
[im 9/32  bone]
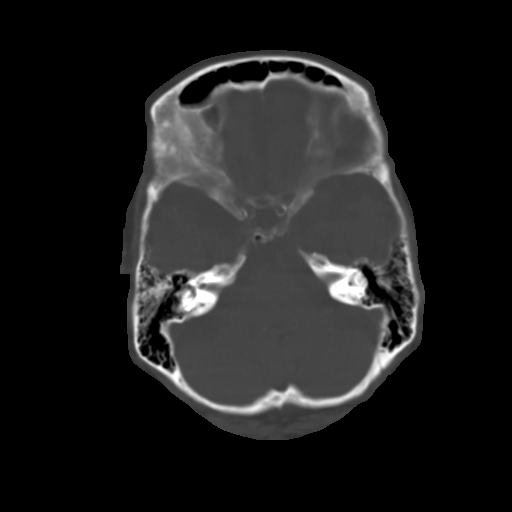
[im 11/32  brain]
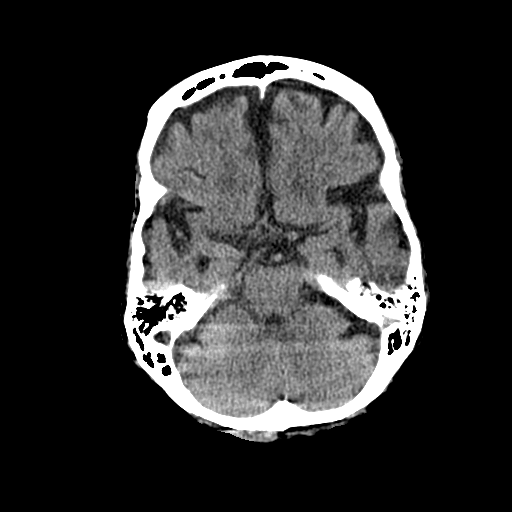
[im 13/32  brain]
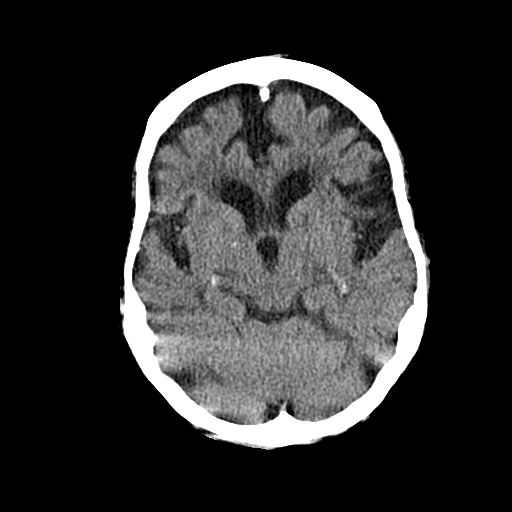
[im 15/32  brain]
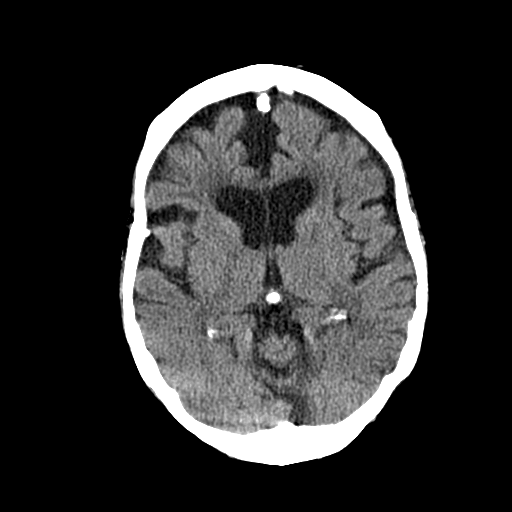
[im 17/32  brain]
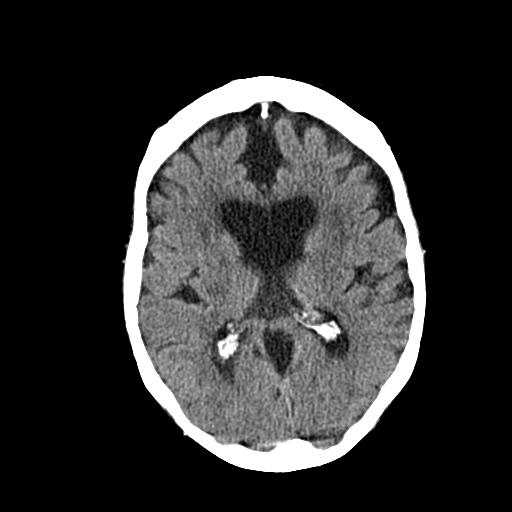
[im 17/32  bone]
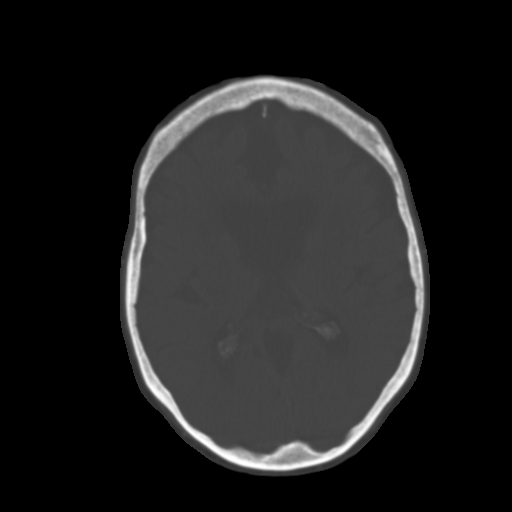
[im 19/32  brain]
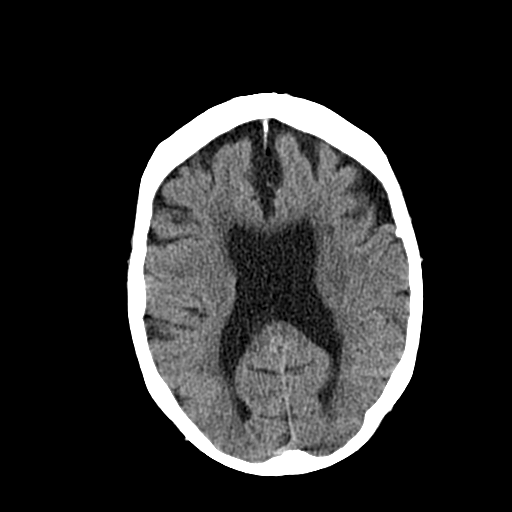
[im 21/32  brain]
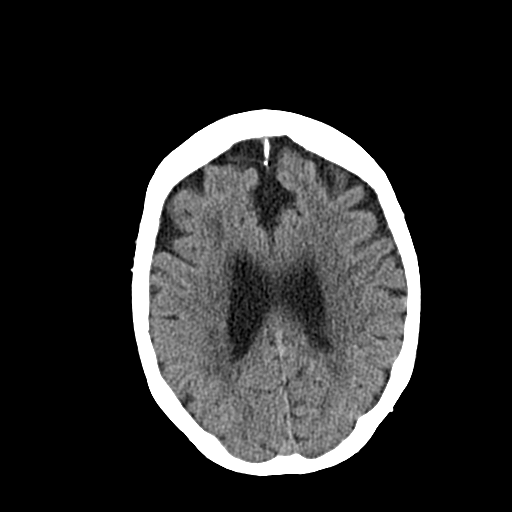
[im 23/32  brain]
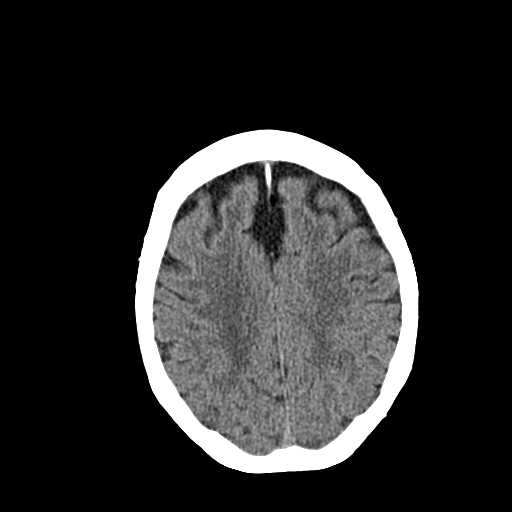
[im 24/32  brain]
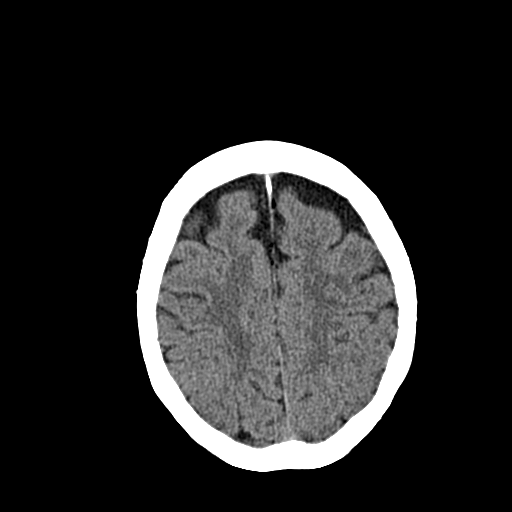
[im 24/32  bone]
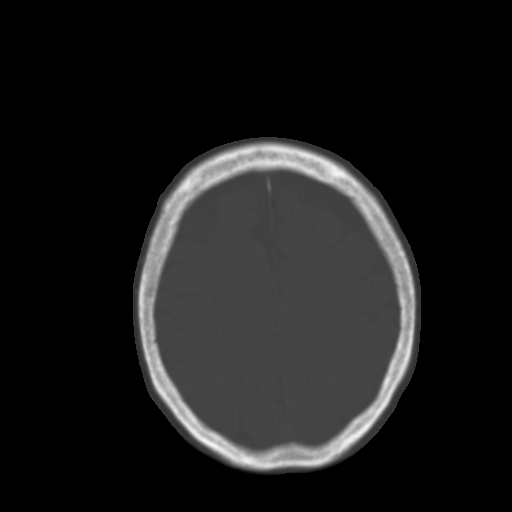
[im 26/32  brain]
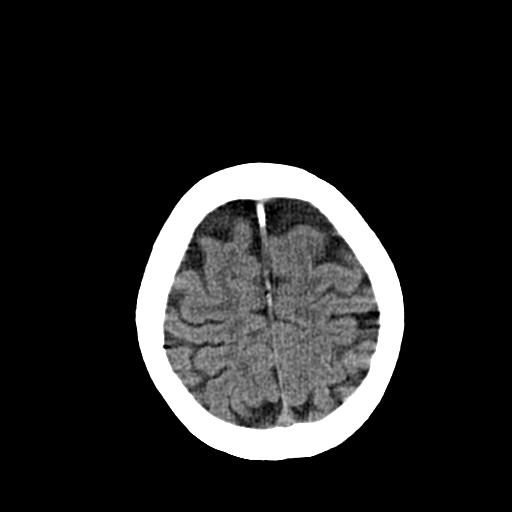
[im 28/32  brain]
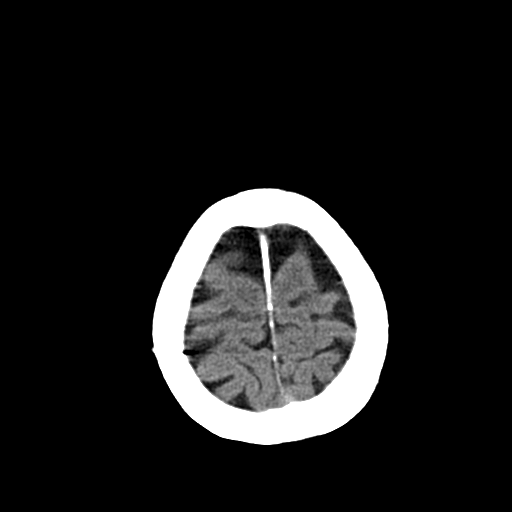
[im 30/32  brain]
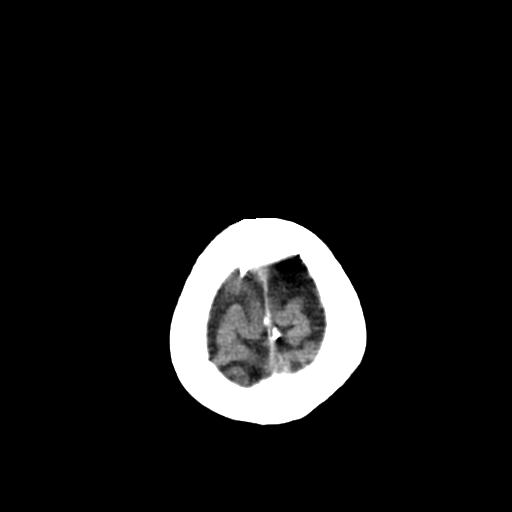

[16 of 30 positions shown; findings below may reference images not displayed]

FINDINGS: There is diffuse cerebral atrophy which is stable. There is low
density throughout the periventricular and subcortical white matter
suggesting chronic changes which is also stable. No evidence for
acute hemorrhage, mass lesion, midline shift, hydrocephalus or large
infarct. Again noted is a large polyp involving the right maxillary
sinus. No acute bone abnormality.
IMPRESSION: No acute intracranial abnormality.

Stable atrophy and evidence for chronic small vessel ischemic
disease.

## 2016-02-19 MED ORDER — LEVOFLOXACIN 500 MG PO TABS: 500.0000 mg | ORAL_TABLET | Freq: Every day | ORAL | 0 refills | Status: AC

## 2016-02-19 NOTE — Telephone Encounter (Signed)
Sent result for mobile x ray to be scanned. LB

## 2016-02-27 ENCOUNTER — Other Ambulatory Visit: Payer: Self-pay | Admitting: Family Medicine

## 2016-02-27 MED ORDER — MUPIROCIN 2 % EX OINT: 1.0000 "application " | TOPICAL_OINTMENT | Freq: Two times a day (BID) | CUTANEOUS | 0 refills | Status: AC

## 2016-02-27 NOTE — Telephone Encounter (Signed)
Caller name: Langley Gauss  Relation to pt: [employer] back number: [phone]  Pharmacy: [facility] [place], [address] AT [place] [phone] (Phone) [phone] (Fax)     Reason for call:  Nurse requesting a refill mupirocin ointment (BACTROBAN) 2 %

## 2016-03-11 ENCOUNTER — Other Ambulatory Visit: Payer: Self-pay | Admitting: Family Medicine

## 2016-03-11 NOTE — Telephone Encounter (Signed)
[facility]   Request refills for pt on 3 medications. Lasix, PROTONIX and levothyroxine   Pharmacy: [facility] [place], [address] AT [facility] [place]

## 2016-03-16 MED ORDER — LEVOTHYROXINE SODIUM 125 MCG PO TABS: 125.0000 ug | ORAL_TABLET | Freq: Every day | ORAL | 0 refills | Status: AC

## 2016-03-16 MED ORDER — FUROSEMIDE 40 MG PO TABS: 40.0000 mg | ORAL_TABLET | Freq: Every day | ORAL | 0 refills | Status: AC

## 2016-03-16 MED ORDER — PANTOPRAZOLE SODIUM 40 MG PO TBEC: 40.0000 mg | DELAYED_RELEASE_TABLET | Freq: Every day | ORAL | 0 refills | Status: AC

## 2016-03-16 NOTE — Addendum Note (Signed)
Addended by: Magdalene Molly A on: 03/16/2016 12:08 PM   Modules accepted: Orders

## 2016-06-27 IMAGING — CR DG CHEST 2V
2 series · 2 of 2 positions shown · non-contrast
Comparison: PA and lateral chest 11/22/2014.

CLINICAL DATA: Tachypnea.

EXAM:
CHEST  2 VIEW

[w chest lat]
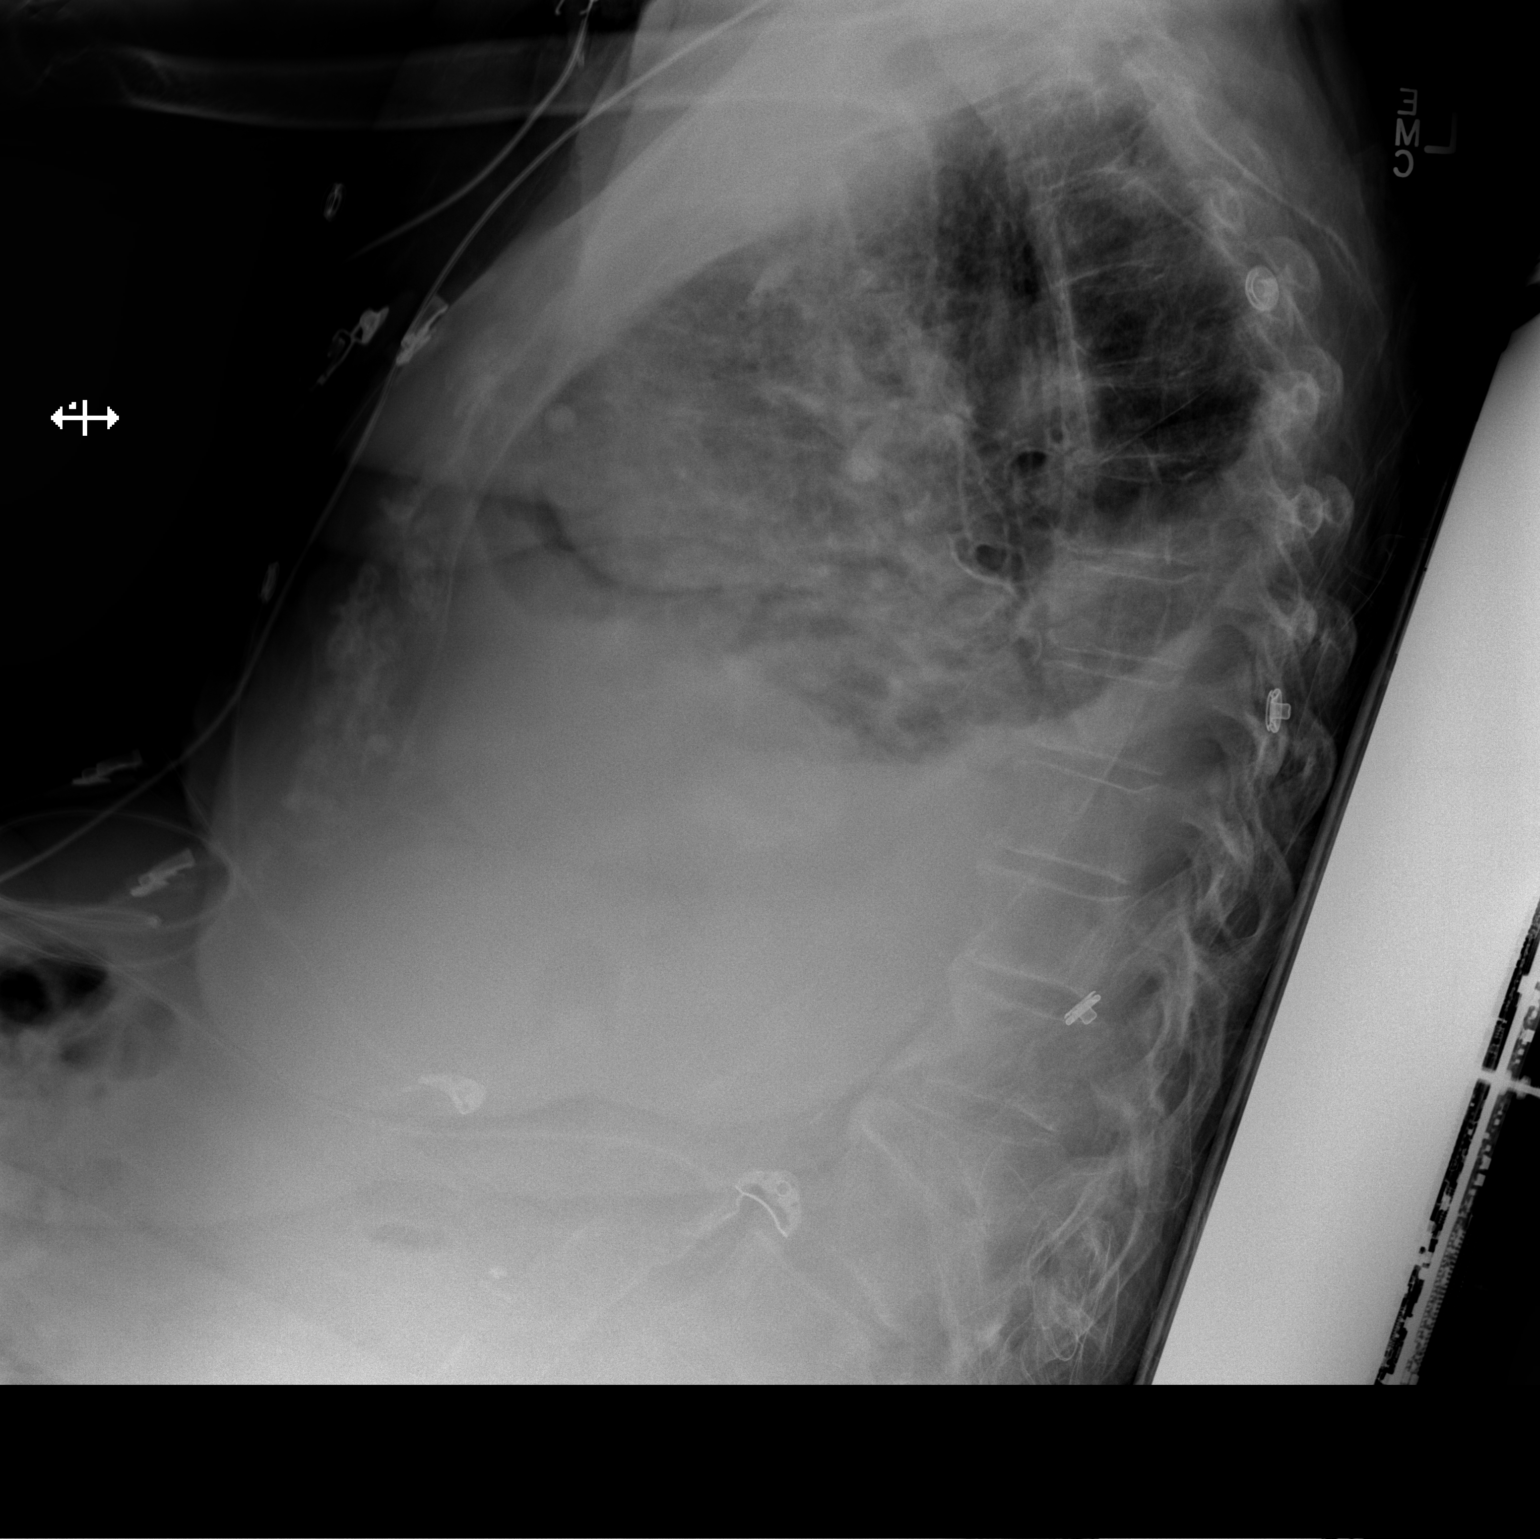

[x chest ap]
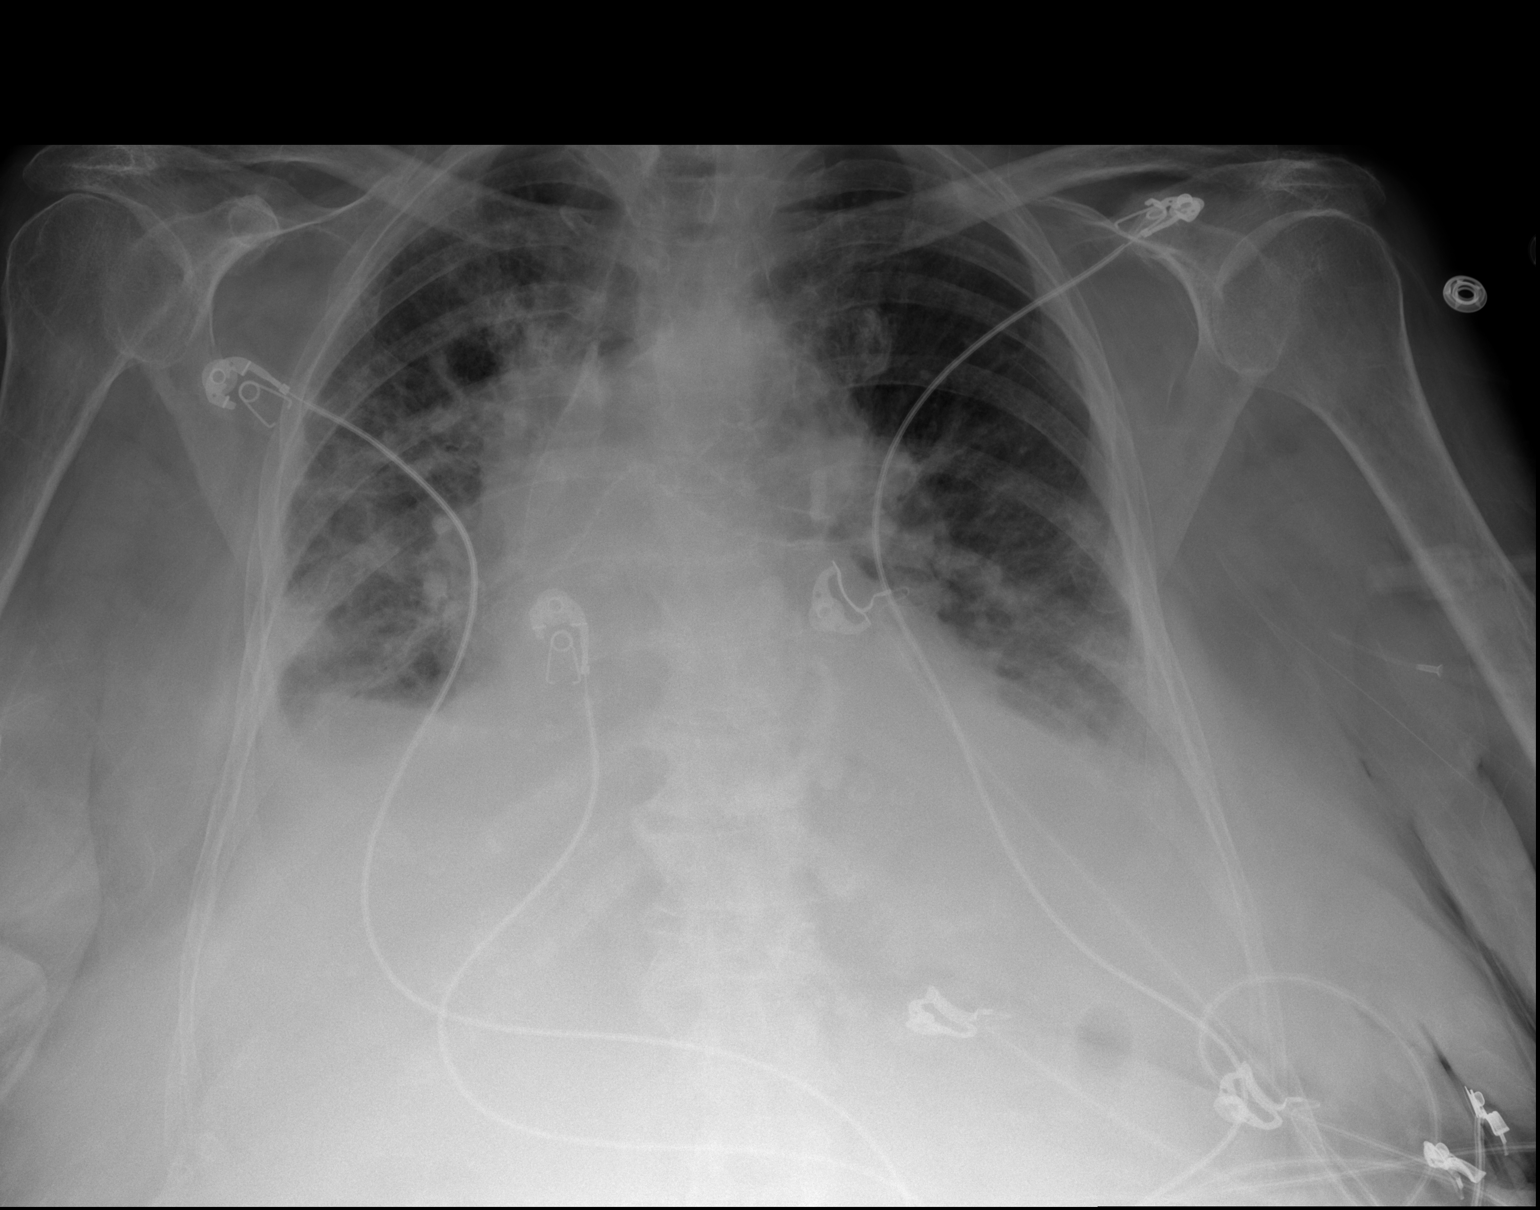

[2 of 2 positions shown; findings below may reference images not displayed]

FINDINGS: There is increased pulmonary edema and moderate bilateral pleural
effusions. Cardiomegaly is identified. Basilar airspace disease is
likely atelectasis. No pneumothorax.
IMPRESSION: Pulmonary edema with bilateral pleural effusions which have
increased since the comparison study.

## 2016-06-29 IMAGING — CR DG CHEST 1V PORT
1 series · 1 of 1 positions shown · non-contrast
Comparison: 11/28/2014

CLINICAL DATA: Pleural effusion in shortness of breath. Increased
confusion.

EXAM:
PORTABLE CHEST 1 VIEW

[AP]
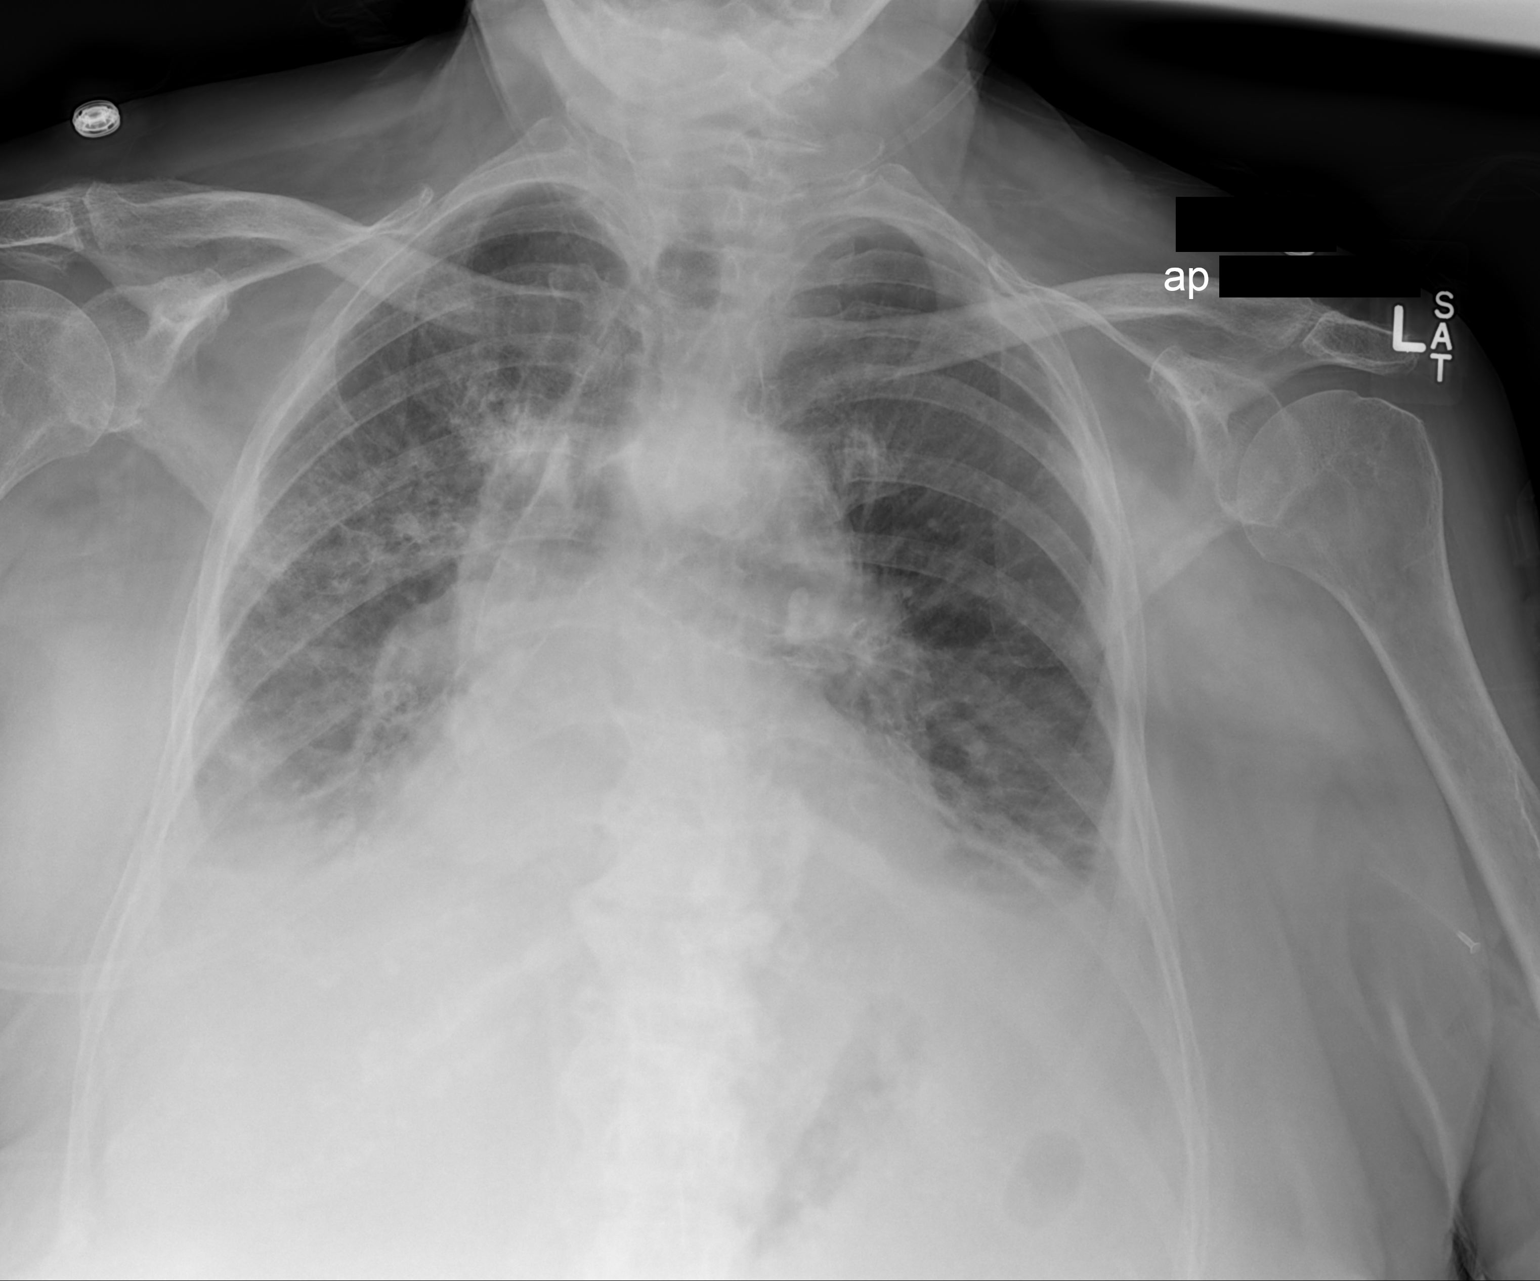

[1 of 1 positions shown; findings below may reference images not displayed]

FINDINGS: Cardiac enlargement noted. There are moderate bilateral pleural
effusions and mild interstitial edema compatible with CHF.
Atelectasis is noted in both lung bases. No airspace consolidation
identified.
IMPRESSION: 1. Moderate CHF.

## 2016-07-30 ENCOUNTER — Telehealth: Payer: Self-pay | Admitting: Family Medicine

## 2016-07-30 NOTE — Telephone Encounter (Signed)
Caller name: Langley Gauss with [facility] Can be reached: [phone]  Reason for call: Pt passed last night 10:31pm - Dr. Milly Jakob med director will likely sign it.

## 2016-07-30 NOTE — Telephone Encounter (Signed)
Received Physician Orders from [facility] [provider aware pt passed away], forwarded to provider/SLS 05/31

## 2016-07-31 DEATH — deceased

## 2016-08-03 NOTE — Telephone Encounter (Signed)
Received D/C-transfer Summary Report from [facility] [provider aware pt passed away] on 08-16-16, marked demographics as Deceased], forwarded to provider/SLS
